# Patient Record
Sex: Female | Born: 1990 | Race: Black or African American | Hispanic: No | Marital: Single | Smoking: Never smoker
Health system: Southern US, Community
[De-identification: ages and names within clinical notes are randomized; demographics above are authoritative.]

## PROBLEM LIST (undated history)

## (undated) ENCOUNTER — Inpatient Hospital Stay (HOSPITAL_COMMUNITY): Payer: Self-pay

## (undated) DIAGNOSIS — F4310 Post-traumatic stress disorder, unspecified: Secondary | ICD-10-CM

## (undated) DIAGNOSIS — F429 Obsessive-compulsive disorder, unspecified: Secondary | ICD-10-CM

## (undated) DIAGNOSIS — B999 Unspecified infectious disease: Secondary | ICD-10-CM

## (undated) DIAGNOSIS — F329 Major depressive disorder, single episode, unspecified: Secondary | ICD-10-CM

## (undated) DIAGNOSIS — F32A Depression, unspecified: Secondary | ICD-10-CM

## (undated) HISTORY — PX: NO PAST SURGERIES: SHX2092

---
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
## 2012-09-28 ENCOUNTER — Encounter (HOSPITAL_COMMUNITY): Payer: Self-pay | Admitting: Emergency Medicine

## 2012-09-28 ENCOUNTER — Emergency Department (HOSPITAL_COMMUNITY)
Admission: EM | Admit: 2012-09-28 | Discharge: 2012-09-28 | Disposition: A | Discharge status: Home / Self Care | Payer: Medicaid Other | Attending: Emergency Medicine | Admitting: Emergency Medicine

## 2012-09-28 DIAGNOSIS — R51 Headache: Secondary | ICD-10-CM

## 2012-09-28 DIAGNOSIS — Z79899 Other long term (current) drug therapy: Secondary | ICD-10-CM | POA: Insufficient documentation

## 2012-09-28 DIAGNOSIS — N12 Tubulo-interstitial nephritis, not specified as acute or chronic: Secondary | ICD-10-CM | POA: Insufficient documentation

## 2012-09-28 DIAGNOSIS — Z3202 Encounter for pregnancy test, result negative: Secondary | ICD-10-CM | POA: Insufficient documentation

## 2012-09-28 DIAGNOSIS — R112 Nausea with vomiting, unspecified: Secondary | ICD-10-CM | POA: Insufficient documentation

## 2012-09-28 DIAGNOSIS — R109 Unspecified abdominal pain: Secondary | ICD-10-CM | POA: Insufficient documentation

## 2012-09-28 LAB — PREGNANCY, URINE: Preg Test, Ur: NEGATIVE

## 2012-09-28 LAB — URINE MICROSCOPIC-ADD ON

## 2012-09-28 LAB — POCT PREGNANCY, URINE: Preg Test, Ur: NEGATIVE

## 2012-09-28 LAB — CBC WITH DIFFERENTIAL/PLATELET
Eosinophils Absolute: 0.1 10*3/uL (ref 0.0–0.7)
Hemoglobin: 10.2 g/dL — ABNORMAL LOW (ref 12.0–15.0)
Lymphocytes Relative: 8 % — ABNORMAL LOW (ref 12–46)
Lymphs Abs: 1.3 10*3/uL (ref 0.7–4.0)
MCH: 25.4 pg — ABNORMAL LOW (ref 26.0–34.0)
Monocytes Relative: 5 % (ref 3–12)
Neutro Abs: 14 10*3/uL — ABNORMAL HIGH (ref 1.7–7.7)
Neutrophils Relative %: 87 % — ABNORMAL HIGH (ref 43–77)
RBC: 4.01 MIL/uL (ref 3.87–5.11)
WBC: 16.2 10*3/uL — ABNORMAL HIGH (ref 4.0–10.5)

## 2012-09-28 LAB — POCT I-STAT, CHEM 8
Chloride: 106 mEq/L (ref 96–112)
HCT: 37 % (ref 36.0–46.0)
Hemoglobin: 12.6 g/dL (ref 12.0–15.0)
Potassium: 4 mEq/L (ref 3.5–5.1)
Sodium: 143 mEq/L (ref 135–145)

## 2012-09-28 LAB — URINALYSIS, ROUTINE W REFLEX MICROSCOPIC
Glucose, UA: NEGATIVE mg/dL
Nitrite: POSITIVE — AB
Specific Gravity, Urine: 1.025 (ref 1.005–1.030)
pH: 6.5 (ref 5.0–8.0)

## 2012-09-28 MED ORDER — METOCLOPRAMIDE HCL 5 MG/ML IJ SOLN
10.0000 mg | Freq: Once | INTRAMUSCULAR | Status: AC
  Administered 2012-09-28: 10 mg via INTRAVENOUS
  Filled 2012-09-28: qty 2

## 2012-09-28 MED ORDER — DIPHENHYDRAMINE HCL 50 MG/ML IJ SOLN
25.0000 mg | Freq: Once | INTRAMUSCULAR | Status: AC
  Administered 2012-09-28: 25 mg via INTRAVENOUS
  Filled 2012-09-28: qty 1

## 2012-09-28 MED ORDER — KETOROLAC TROMETHAMINE 30 MG/ML IJ SOLN
30.0000 mg | Freq: Once | INTRAMUSCULAR | Status: AC
  Administered 2012-09-28: 30 mg via INTRAVENOUS
  Filled 2012-09-28: qty 1

## 2012-09-28 MED ORDER — DEXTROSE 5 % IV SOLN
1.0000 g | Freq: Once | INTRAVENOUS | Status: AC
  Administered 2012-09-28: 1 g via INTRAVENOUS
  Filled 2012-09-28: qty 10

## 2012-09-28 MED ORDER — SODIUM CHLORIDE 0.9 % IV BOLUS (SEPSIS)
1000.0000 mL | Freq: Once | INTRAVENOUS | Status: AC
  Administered 2012-09-28: 1000 mL via INTRAVENOUS

## 2012-09-28 MED ORDER — SULFAMETHOXAZOLE-TRIMETHOPRIM 800-160 MG PO TABS: 1.0000 | ORAL_TABLET | Freq: Two times a day (BID) | ORAL | Status: DC

## 2012-09-28 MED ORDER — PROMETHAZINE HCL 25 MG PO TABS: 25.0000 mg | ORAL_TABLET | Freq: Four times a day (QID) | ORAL | Status: DC | PRN

## 2012-09-28 NOTE — ED Provider Notes (Signed)
History     CSN: [id number]  Arrival date & time 09/28/12  1127   First MD Initiated Contact with Patient 09/28/12 1220      Chief Complaint  Patient presents with  . Abdominal Pain  . Headache    (Consider location/radiation/quality/duration/timing/severity/associated sxs/prior treatment) HPI Comments: Patient comes to the ER for evaluation of headache with nausea and vomiting. Patient reports that she started having nausea and vomiting several days ago. Patient reports that the vomiting has been intermittent. She cannot cough anything down. She has had similar symptoms in the past. Patient reports that she started to have a mild headache which has progressively worsened as well. She sometimes gets these headaches when she has episodes of vomiting similar to this, but the headache has been more persistent. She has not had any fever. No neck pain or stiffness. She does report a crampy and sharp abdominal pain that occurs when she vomits, but that resolved. In between episodes of emesis, she does not have abdominal pain. There has not been any blood in her vomit. She denies diarrhea.  Patient is a 22 y.o. female presenting with abdominal pain and headaches.  Abdominal Pain The primary symptoms of the illness include abdominal pain, nausea and vomiting. The primary symptoms of the illness do not include fever.  Headache  Associated symptoms include nausea and vomiting. Pertinent negatives include no fever.    History reviewed. No pertinent past medical history.  History reviewed. No pertinent past surgical history.  History reviewed. No pertinent family history.  History  Substance Use Topics  . Smoking status: Not on file  . Smokeless tobacco: Not on file  . Alcohol Use: Not on file    OB History    Grav Para Term Preterm Abortions TAB SAB Ect Mult Living                  Review of Systems  Constitutional: Negative for fever.  Gastrointestinal: Positive for nausea,  vomiting and abdominal pain.  Neurological: Positive for headaches.  All other systems reviewed and are negative.    Allergies  Review of patient's allergies indicates no known allergies.  Home Medications   Current Outpatient Rx  Name  Route  Sig  Dispense  Refill  . QUETIAPINE FUMARATE ER 200 MG PO TB24   Oral   Take 200 mg by mouth at bedtime.         . SERTRALINE HCL 50 MG PO TABS   Oral   Take 50 mg by mouth every morning.           BP 130/59  Temp 99 F (37.2 C) (Oral)  Resp 18  SpO2 100%  LMP 09/15/2012  Physical Exam  Constitutional: She is oriented to person, place, and time. She appears well-developed and well-nourished. No distress.  HENT:  Head: Normocephalic and atraumatic.  Right Ear: Hearing normal.  Nose: Nose normal.  Mouth/Throat: Oropharynx is clear and moist and mucous membranes are normal.  Eyes: Conjunctivae normal and EOM are normal. Pupils are equal, round, and reactive to light.  Neck: Normal range of motion. Neck supple.  Cardiovascular: Normal rate, regular rhythm, S1 normal and S2 normal.  Exam reveals no gallop and no friction rub.   No murmur heard. Pulmonary/Chest: Effort normal and breath sounds normal. No respiratory distress. She exhibits no tenderness.  Abdominal: Soft. Normal appearance and bowel sounds are normal. There is no hepatosplenomegaly. There is no tenderness. There is no rebound, no guarding, no tenderness  at McBurney's point and negative Murphy's sign. No hernia.  Musculoskeletal: Normal range of motion.  Neurological: She is alert and oriented to person, place, and time. She has normal strength. No cranial nerve deficit or sensory deficit. Coordination normal. GCS eye subscore is 4. GCS verbal subscore is 5. GCS motor subscore is 6.  Skin: Skin is warm, dry and intact. No rash noted. No cyanosis.  Psychiatric: She has a normal mood and affect. Her speech is normal and behavior is normal. Thought content normal.     ED Course  Procedures (including critical care time)  Labs Reviewed  CBC WITH DIFFERENTIAL - Abnormal; Notable for the following:    WBC 16.2 (*)     Hemoglobin 10.2 (*)     HCT 32.9 (*)     MCH 25.4 (*)     Neutrophils Relative 87 (*)     Neutro Abs 14.0 (*)     Lymphocytes Relative 8 (*)     All other components within normal limits  URINALYSIS, ROUTINE W REFLEX MICROSCOPIC - Abnormal; Notable for the following:    APPearance CLOUDY (*)     Nitrite POSITIVE (*)     Leukocytes, UA MODERATE (*)     All other components within normal limits  URINE MICROSCOPIC-ADD ON - Abnormal; Notable for the following:    Squamous Epithelial / LPF FEW (*)     Bacteria, UA MANY (*)     All other components within normal limits  PREGNANCY, URINE  POCT PREGNANCY, URINE  POCT I-STAT, CHEM 8  URINE CULTURE   No results found.   No diagnosis found.    MDM  Patient came to the ER with complaints of nausea and vomiting. She says that this is been going on for almost a week. Patient also has an associated headache. Headache was slow in onset, not thunderclap. She has a normal neurologic examination. Patient reports that she has some intermittent abdominal pain, but abdominal exam was completely benign. [name] headache was treated with Toradol, Reglan and Benadryl. Her headache is completely resolved. She was also hydrated. Repeat examination reveals that she is not expressing any abdominal pain. Repeat abdominal exam was soft, nontender. Lab work revealed leukocytosis with evidence of urinary tract infection. This is possibly consistent with acute pyelonephritis. Patient is no other vomiting and is a candidate for outpatient treatment. She was given a first dose of Rocephin here in the ER we'll discharge with antiemetics antibiotic coverage. Return if symptoms worsen.        Gilda Crease, MD 09/28/12 [phone]

## 2012-09-28 NOTE — ED Notes (Signed)
Pt reports vomiting and headache for the last week. Pt awake, alert, NAD.

## 2012-09-30 LAB — URINE CULTURE: Colony Count: >=100000

## 2012-10-01 NOTE — ED Notes (Signed)
+  Urine. Patient treated with Septra. Sensitive to same. Per protocol MD. °

## 2013-08-21 ENCOUNTER — Emergency Department (HOSPITAL_COMMUNITY)
Admission: EM | Admit: 2013-08-21 | Discharge: 2013-08-21 | Disposition: A | Discharge status: Home / Self Care | Payer: Medicaid Other | Attending: Emergency Medicine | Admitting: Emergency Medicine

## 2013-08-21 ENCOUNTER — Encounter (HOSPITAL_COMMUNITY): Payer: Self-pay | Admitting: Emergency Medicine

## 2013-08-21 DIAGNOSIS — L03019 Cellulitis of unspecified finger: Secondary | ICD-10-CM | POA: Insufficient documentation

## 2013-08-21 DIAGNOSIS — Z79899 Other long term (current) drug therapy: Secondary | ICD-10-CM | POA: Insufficient documentation

## 2013-08-21 DIAGNOSIS — L03011 Cellulitis of right finger: Secondary | ICD-10-CM

## 2013-08-21 MED ORDER — SULFAMETHOXAZOLE-TRIMETHOPRIM 800-160 MG PO TABS: 1.0000 | ORAL_TABLET | Freq: Two times a day (BID) | ORAL | Status: DC

## 2013-08-21 NOTE — ED Provider Notes (Signed)
CSN: [id number]     Arrival date & time 08/21/13  1634 History  This chart was scribed for non-physician practitioner Sharilyn Sites, PA-C working with Geoffery Lyons, MD by Valera Castle, ED scribe. This patient was seen in room TR06C/TR06C and the patient's care was started at 4:52 PM.  Chief Complaint  Patient presents with  . Hand Pain   The history is provided by the patient. No language interpreter was used.   HPI Comments: Shelby Duncan is a 22 y.o. female who presents to the Emergency Department complaining of gradual, moderate, constant right ring finger pain, with swelling and possible infection under nailbed, onset 2 weeks ago. She reports one episode of green drainage after trying to squeeze the area. She reports recently having her nails done 2 weeks ago, and a few days afterwards noticing the pain and swelling. She denies fever, numbness, tingling, and any other associated symptoms. She denies any allergies. She denies any medical history.   PCP - Default, Provider, MD  History reviewed. No pertinent past medical history. History reviewed. No pertinent past surgical history. History reviewed. No pertinent family history. History  Substance Use Topics  . Smoking status: Never Smoker   . Smokeless tobacco: Not on file  . Alcohol Use: No   OB History   Grav Para Term Preterm Abortions TAB SAB Ect Mult Living                 Review of Systems  Constitutional: Negative for fever.  Skin:       Positive for right ring finger pain around the nail, with swelling.  Neurological: Negative for numbness (and tingling).  All other systems reviewed and are negative.   Allergies  Review of patient's allergies indicates no known allergies.  Home Medications   Current Outpatient Rx  Name  Route  Sig  Dispense  Refill  . promethazine (PHENERGAN) 25 MG tablet   Oral   Take 1 tablet (25 mg total) by mouth every 6 (six) hours as needed for nausea.   30 tablet   0   . QUEtiapine  (SEROQUEL XR) 200 MG 24 hr tablet   Oral   Take 200 mg by mouth at bedtime.         . sertraline (ZOLOFT) 50 MG tablet   Oral   Take 50 mg by mouth every morning.         . sulfamethoxazole-trimethoprim (SEPTRA DS) 800-160 MG per tablet   Oral   Take 1 tablet by mouth every 12 (twelve) hours.   20 tablet   0    Triage Vitals: BP 123/64  Pulse 84  Temp(Src) 98.1 F (36.7 C) (Oral)  Resp 18  SpO2 100%  Physical Exam  Nursing note and vitals reviewed. Constitutional: She is oriented to person, place, and time. She appears well-developed and well-nourished. No distress.  HENT:  Head: Normocephalic and atraumatic.  Eyes: Conjunctivae and EOM are normal. Pupils are equal, round, and reactive to light.  Neck: Normal range of motion.  Cardiovascular: Normal rate, regular rhythm and normal heart sounds.   Pulmonary/Chest: Effort normal and breath sounds normal. No respiratory distress. She has no wheezes.  Musculoskeletal: Normal range of motion.  Neurological: She is alert and oriented to person, place, and time.  Skin: Skin is warm and dry. She is not diaphoretic.  Large perionychia of right ring finger. Full ROM of finger maintained; strong cap refill; sensation intact  Psychiatric: She has a normal mood and affect.  ED Course  Procedures (including critical care time)  INCISION AND DRAINAGE Performed by: Garlon Hatchet Consent: Verbal consent obtained. Risks and benefits: risks, benefits and alternatives were discussed Type: abscess  Body area: right ring finger   Anesthesia: local infiltration  Incision was made with an 18G needle.  Local anesthetic: none  Anesthetic total: none  Complexity: complex Blunt dissection to break up loculations  Drainage: purulent  Drainage amount: small/moderate  Packing material: none  Patient tolerance: Patient tolerated the procedure well with no immediate complications.   DIAGNOSTIC STUDIES: Oxygen Saturation  is 100% on room air, normal by my interpretation.    COORDINATION OF CARE: 4:55  PM-Discussed treatment plan which includes I&D with pt at bedside and pt agreed to plan.   Labs Review Labs Reviewed - No data to display Imaging Review No results found.  EKG Interpretation   None      No orders of the defined types were placed in this encounter.    MDM   1. Paronychia of finger, right    With a large paronychia of right ring finger.  I&D as above, patient tolerated well. She will be started on Bactrim. Instructed to keep finger clean and dry. Advised not to replace artificial nail until finger heals completely.  Discussed plan with pt, she agreed.  Return precautions advised.  I personally performed the services described in this documentation, which was scribed in my presence. The recorded information has been reviewed and is accurate.  Garlon Hatchet, PA-C 08/21/13 1722

## 2013-08-21 NOTE — ED Notes (Signed)
Pt with pain and swelling at base of 4th fingernail on right hand. States she had her nails done and the nail cracked so the tech put acrylic on top of the cracked nail. Pain and swelling for approx 2 weeks

## 2013-08-21 NOTE — ED Notes (Signed)
Pt with pain and swelling and possible infection under right 4th digit nail bed area x 2 weeks with some drainage

## 2013-08-22 NOTE — ED Provider Notes (Signed)
Medical screening examination/treatment/procedure(s) were performed by non-physician practitioner and as supervising physician I was immediately available for consultation/collaboration.     Geoffery Lyons, MD 08/22/13 0000

## 2013-09-24 DIAGNOSIS — F209 Schizophrenia, unspecified: Secondary | ICD-10-CM | POA: Insufficient documentation

## 2013-09-24 DIAGNOSIS — F329 Major depressive disorder, single episode, unspecified: Secondary | ICD-10-CM | POA: Insufficient documentation

## 2014-11-07 ENCOUNTER — Encounter (HOSPITAL_COMMUNITY): Payer: Self-pay

## 2014-11-07 ENCOUNTER — Emergency Department (HOSPITAL_COMMUNITY)
Admission: EM | Admit: 2014-11-07 | Discharge: 2014-11-07 | Disposition: A | Discharge status: Home / Self Care | Payer: Medicaid Other | Attending: Emergency Medicine | Admitting: Emergency Medicine

## 2014-11-07 DIAGNOSIS — R51 Headache: Secondary | ICD-10-CM | POA: Diagnosis not present

## 2014-11-07 DIAGNOSIS — Z3202 Encounter for pregnancy test, result negative: Secondary | ICD-10-CM | POA: Diagnosis not present

## 2014-11-07 DIAGNOSIS — N644 Mastodynia: Secondary | ICD-10-CM | POA: Diagnosis not present

## 2014-11-07 DIAGNOSIS — Z79899 Other long term (current) drug therapy: Secondary | ICD-10-CM | POA: Insufficient documentation

## 2014-11-07 LAB — POC URINE PREG, ED: Preg Test, Ur: NEGATIVE

## 2014-11-07 MED ORDER — IBUPROFEN 600 MG PO TABS: 600.0000 mg | ORAL_TABLET | Freq: Three times a day (TID) | ORAL | Status: DC | PRN

## 2014-11-07 NOTE — Discharge Instructions (Signed)
-   Follow-up with a PCP (Primary Care Provider) to set up an outpatient ultrasound of your breasts for further evaluation  Breast Tenderness Breast tenderness is a common problem for women of all ages. Breast tenderness may cause mild discomfort to severe pain. It has a variety of causes. Your health care provider will find out the likely cause of your breast tenderness by examining your breasts, asking you about symptoms, and ordering some tests. Breast tenderness usually does not mean you have breast cancer. HOME CARE INSTRUCTIONS  Breast tenderness often can be handled at home. You can try:  Getting fitted for a new bra that provides more support, especially during exercise.  Wearing a more supportive bra or sports bra while sleeping when your breasts are very tender.  If you have a breast injury, apply ice to the area:  Put ice in a plastic bag.  Place a towel between your skin and the bag.  Leave the ice on for 20 minutes, 2-3 times a day.  If your breasts are too full of milk as a result of breastfeeding, try:  Expressing milk either by hand or with a breast pump.  Applying a warm compress to the breasts for relief.  Taking over-the-counter pain relievers, if approved by your health care provider.  Taking other medicines that your health care provider prescribes. These may include antibiotic medicines or birth control pills. Over the long term, your breast tenderness might be eased if you:  Cut down on caffeine.  Reduce the amount of fat in your diet. Keep a log of the days and times when your breasts are most tender. This will help you and your health care provider find the cause of the tenderness and how to relieve it. Also, learn how to do breast exams at home. This will help you notice if you have an unusual growth or lump that could cause tenderness. SEEK MEDICAL CARE IF:   Any part of your breast is hard, red, and hot to the touch. This could be a sign of  infection.  Fluid is coming out of your nipples (and you are not breastfeeding). Especially watch for blood or pus.  You have a fever as well as breast tenderness.  You have a new or painful lump in your breast that remains after your menstrual period ends.  You have tried to take care of the pain at home, but it has not gone away.  Your breast pain is getting worse, or the pain is making it hard to do the things you usually do during your day. Document Released: 08/26/2008 Document Revised: 05/16/2013 Document Reviewed: 04/12/2013 [facility] Patient Information 2015 [place], [place]. This information is not intended to replace advice given to you by your health care provider. Make sure you discuss any questions you have with your health care provider.

## 2014-11-07 NOTE — ED Notes (Signed)
Pt presents with 2 day h/o pain to both sides of her chest.  Pt reports "lump" noted beside L breast that became tender, reports having same to R breast x 1 year but it became painful when L side began to hurt.  Areas are tender to palpation.  Pt also reports 2 day h/o frontal headache (h/o migraines); +nausea and vomiting; pt reports she gets migraines when those areas become painful.

## 2014-11-07 NOTE — ED Provider Notes (Signed)
CSN: [id number]     Arrival date & time 11/07/14  1318 History   First MD Initiated Contact with Patient 11/07/14 1609     No chief complaint on file. CC: Left breast pain   (Consider location/radiation/quality/duration/timing/severity/associated sxs/prior Treatment) Patient is a 24 y.o. female presenting with chest pain.  Chest Pain Chest pain location: Left upper lateral breast. Pain quality: aching and throbbing   Pain radiates to:  Does not radiate Pain radiates to the back: no   Pain severity:  Moderate Onset quality:  Gradual Duration:  2 days Timing:  Constant Progression:  Unchanged Chronicity:  New Context: no trauma   Relieved by:  None tried Worsened by:  Certain positions and movement Ineffective treatments:  None tried Associated symptoms: headache   Associated symptoms: no abdominal pain, no altered mental status, no cough, no diaphoresis, no fever, no nausea, no palpitations, no shortness of breath, not vomiting and no weakness   Risk factors: no coronary artery disease, no diabetes mellitus, no hypertension, not female and no prior DVT/PE     History reviewed. No pertinent past medical history. History reviewed. No pertinent past surgical history. History reviewed. No pertinent family history. History  Substance Use Topics  . Smoking status: Never Smoker   . Smokeless tobacco: Not on file  . Alcohol Use: No   OB History    No data available     Review of Systems  Constitutional: Negative for fever, diaphoresis and unexpected weight change.  HENT: Negative for sore throat.   Eyes: Negative for visual disturbance.  Respiratory: Negative for cough, shortness of breath and wheezing.   Cardiovascular: Positive for chest pain. Negative for palpitations.  Gastrointestinal: Negative for nausea, vomiting and abdominal pain.  Genitourinary: Negative for dysuria and flank pain.  Musculoskeletal: Negative for neck pain.  Skin: Negative for rash.  Neurological:  Positive for headaches. Negative for syncope and weakness.      Allergies  Review of patient's allergies indicates no known allergies.  Home Medications   Prior to Admission medications   Medication Sig Start Date End Date Taking? Authorizing Provider  promethazine (PHENERGAN) 25 MG tablet Take 1 tablet (25 mg total) by mouth every 6 (six) hours as needed for nausea. 09/28/12   Orpah Greek, MD  QUEtiapine (SEROQUEL XR) 200 MG 24 hr tablet Take 200 mg by mouth at bedtime.    Historical Provider, MD  sertraline (ZOLOFT) 50 MG tablet Take 50 mg by mouth every morning.    Historical Provider, MD  sulfamethoxazole-trimethoprim (SEPTRA DS) 800-160 MG per tablet Take 1 tablet by mouth every 12 (twelve) hours. 09/28/12   Orpah Greek, MD  sulfamethoxazole-trimethoprim (SEPTRA DS) 800-160 MG per tablet Take 1 tablet by mouth every 12 (twelve) hours. 08/21/13   Larene Pickett, PA-C   BP 128/63 mmHg  Pulse 70  Temp(Src) 97.7 F (36.5 C) (Oral)  Resp 18  Ht 5' 9"  (1.753 m)  Wt 184 lb 8 oz (83.689 kg)  BMI 27.23 kg/m2  SpO2 100%  LMP 11/07/2014 Physical Exam  Constitutional: She is oriented to person, place, and time. She appears well-developed and well-nourished. No distress.  HENT:  Head: Normocephalic.  Mouth/Throat: No oropharyngeal exudate.  Eyes: Pupils are equal, round, and reactive to light.  Neck: Neck supple.  Cardiovascular: Normal rate and normal heart sounds.  Exam reveals no friction rub.   No murmur heard. Pulmonary/Chest: Effort normal and breath sounds normal. No respiratory distress. She has no wheezes. She has  no rales. Right breast exhibits tenderness. Right breast exhibits no inverted nipple, no mass, no nipple discharge and no skin change. Left breast exhibits tenderness. Left breast exhibits no inverted nipple, no mass, no nipple discharge and no skin change.    Bilateral mild TTP in upper outer quadrants. Breast tissue heterogeneity along areas of  tenderness with no discrete masses. No overlying skin redness, induration, fluctuance, or abnormalities. No breast asymmetry. No LAD noted in axillae or supraclavicular areas bilaterally.  Abdominal: Soft. She exhibits no distension. There is no tenderness.  Musculoskeletal: She exhibits no tenderness.  Neurological: She is alert and oriented to person, place, and time. She displays normal reflexes. No cranial nerve deficit. She exhibits normal muscle tone.  Skin: Skin is warm. No rash noted.  Nursing note and vitals reviewed.   ED Course  Procedures (including critical care time) Labs Review Labs Reviewed  POC URINE PREG, ED    Imaging Review No results found.   EKG Interpretation   Date/Time:  Thursday November 07 2014 13:25:00 EST Ventricular Rate:  70 PR Interval:  124 QRS Duration: 84 QT Interval:  366 QTC Calculation: 395 R Axis:   85 Text Interpretation:  Normal sinus rhythm Normal ECG No old tracing to  compare Confirmed by Debby Freiberg [phone]) on 11/07/2014 4:23:41 PM      MDM   Final diagnoses:  Breast pain    Ms. Colbaugh is a 24 yo F with no significant PMHx who p/w bilateral breast pain, L>R, with associated "soft spots" on breasts bilaterally. See HPI above. On arrival, T 97.88F, HR 70, RR 18, BP 128/63, satting 100% on RA. Exam as above, pt overall well-appearing and in NAD, with normal WOB and clear BS bilaterally. Breast exam as above.  Pt's history and presentation is most c/w fibrocystic breast changes, with diffusely heterogenous breast tissue with mild TTP but no discrete masses. No h/o breast trauma to suggest fatty necrosis. No focal mass to suggest adenoma. No nipple changes or signs of ductal dilation. No erythema, fluctuance, or signs of mastoiditis or breast abscess. Pt does have family h/o breast CA, unknown BRCA status, but has no focal masses, no weight loss, no night sweats, and sx have been present x 1 year intermittently. No specific relation to  menstruation and UPT negative.   Given benign findings with stable vitals, do not suspect acute emergent etiology. Will d/c with close outpt f/u for breast ultrasound for further assessment. Discussed the importance of close f/u and imaging with pt who understands, including further w/u for cancer. VSS, will d/c home.  Clinical Impression: 1. Breast pain     Disposition: Discharge  Condition: Good  I have discussed the results, Dx and Tx plan with the pt(& family if present). He/she/they expressed understanding and agree(s) with the plan. Discharge instructions discussed at great length. Strict return precautions discussed and pt &/or family have verbalized understanding of the instructions. No further questions at time of discharge.    Discharge Medication List as of 11/07/2014  5:01 PM    START taking these medications   Details  ibuprofen (ADVIL,MOTRIN) 600 MG tablet Take 1 tablet (600 mg total) by mouth every 8 (eight) hours as needed for moderate pain., Starting 11/07/2014, Until Discontinued, Print        Follow Up: [place]     [address] [place] [id number] [phone] In 1 week Follow-up with a PCP for outpatient ultrasound of your breasts  Pt seen in conjunction with Dr. Henrene Hawking, MD 11/08/14 5974  Debby Freiberg, MD 11/11/14 [phone]

## 2014-11-07 NOTE — ED Notes (Signed)
Resident, Dr. Penne LashIssacs, at bedside.

## 2015-06-19 ENCOUNTER — Encounter (HOSPITAL_COMMUNITY): Payer: Self-pay | Admitting: *Deleted

## 2015-06-19 ENCOUNTER — Inpatient Hospital Stay (HOSPITAL_COMMUNITY)
Admission: AD | Admit: 2015-06-19 | Discharge: 2015-06-19 | Disposition: A | Discharge status: Home / Self Care | Payer: Medicaid Other | Source: Ambulatory Visit | Attending: Obstetrics and Gynecology | Admitting: Obstetrics and Gynecology

## 2015-06-19 DIAGNOSIS — N644 Mastodynia: Secondary | ICD-10-CM | POA: Insufficient documentation

## 2015-06-19 DIAGNOSIS — N926 Irregular menstruation, unspecified: Secondary | ICD-10-CM

## 2015-06-19 DIAGNOSIS — R110 Nausea: Secondary | ICD-10-CM | POA: Diagnosis present

## 2015-06-19 DIAGNOSIS — Z3A01 Less than 8 weeks gestation of pregnancy: Secondary | ICD-10-CM | POA: Diagnosis not present

## 2015-06-19 DIAGNOSIS — O26891 Other specified pregnancy related conditions, first trimester: Secondary | ICD-10-CM | POA: Insufficient documentation

## 2015-06-19 LAB — URINALYSIS, ROUTINE W REFLEX MICROSCOPIC
BILIRUBIN URINE: NEGATIVE
Glucose, UA: NEGATIVE mg/dL
HGB URINE DIPSTICK: NEGATIVE
KETONES UR: NEGATIVE mg/dL
Leukocytes, UA: NEGATIVE
NITRITE: NEGATIVE
Protein, ur: NEGATIVE mg/dL
Specific Gravity, Urine: 1.02 (ref 1.005–1.030)
UROBILINOGEN UA: 1 mg/dL (ref 0.0–1.0)
pH: 7.5 (ref 5.0–8.0)

## 2015-06-19 LAB — POCT PREGNANCY, URINE: PREG TEST UR: POSITIVE — AB

## 2015-06-19 NOTE — MAU Provider Note (Signed)
S:  Shelby Duncan is a 24 y.o. G1P0 at [redacted]w[redacted]d wks here for confirmation of pregnancy.  [name] [name] last menstrual period was 05/17/2015.Marland Kitchen  Denies any vaginal bleeding or abdominal pain.  Plans to get prenatal care at the [facility]; patient instructed to call the HD for care until she can bee seen in the [facility].  + nipple pain.    O:  GENERAL: Well-developed, well-nourished female in no acute distress.  LUNGS: Effort normal SKIN: Warm, dry and without erythema PSYCH: Normal mood and affect  No past medical history on file.  No family history on file.   Filed Vitals:   06/19/15 1815  BP: 130/63  Pulse: 101  Temp: 98.3 F (36.8 C)  Resp: 18   General:  A&OX3 with no signs of acute distress. She appears well-developed and well-nourished. No distress.  Neck: Normal range of motion.  Pulmonary/Chest: Effort normal. No respiratory distress.  Musculoskeletal: Normal range of motion.  Neurological: She is alert and oriented to person, place, and time.  Skin: Skin is warm and dry.   A: Positive Pregnancy Test Missed period   P: Explained benefits of taking prenatal vitamins w/folic acid early in pregnancy. Begin prenatal care. Reviewed warning signs of pregnancy.  Duane Lope, NP  06/19/2015 7:18 PM

## 2015-06-19 NOTE — MAU Note (Signed)
Pt stated  she has ben nauseated and having sore breasts. Pt reports she had a positive HPT.

## 2015-07-08 ENCOUNTER — Inpatient Hospital Stay (HOSPITAL_COMMUNITY)
Admission: AD | Admit: 2015-07-08 | Discharge: 2015-07-08 | Disposition: A | Discharge status: Home / Self Care | Payer: Medicaid Other | Source: Ambulatory Visit | Attending: Family Medicine | Admitting: Family Medicine

## 2015-07-08 ENCOUNTER — Encounter (HOSPITAL_COMMUNITY): Payer: Self-pay | Admitting: *Deleted

## 2015-07-08 ENCOUNTER — Inpatient Hospital Stay (HOSPITAL_COMMUNITY): Payer: Medicaid Other

## 2015-07-08 DIAGNOSIS — O209 Hemorrhage in early pregnancy, unspecified: Secondary | ICD-10-CM

## 2015-07-08 DIAGNOSIS — A499 Bacterial infection, unspecified: Secondary | ICD-10-CM

## 2015-07-08 DIAGNOSIS — N760 Acute vaginitis: Secondary | ICD-10-CM | POA: Diagnosis not present

## 2015-07-08 DIAGNOSIS — Z3A01 Less than 8 weeks gestation of pregnancy: Secondary | ICD-10-CM | POA: Insufficient documentation

## 2015-07-08 DIAGNOSIS — O4691 Antepartum hemorrhage, unspecified, first trimester: Secondary | ICD-10-CM

## 2015-07-08 DIAGNOSIS — N939 Abnormal uterine and vaginal bleeding, unspecified: Secondary | ICD-10-CM | POA: Diagnosis present

## 2015-07-08 DIAGNOSIS — O23591 Infection of other part of genital tract in pregnancy, first trimester: Secondary | ICD-10-CM | POA: Diagnosis not present

## 2015-07-08 DIAGNOSIS — B9689 Other specified bacterial agents as the cause of diseases classified elsewhere: Secondary | ICD-10-CM

## 2015-07-08 HISTORY — DX: Unspecified infectious disease: B99.9

## 2015-07-08 LAB — WET PREP, GENITAL
Trich, Wet Prep: NONE SEEN
Yeast Wet Prep HPF POC: NONE SEEN

## 2015-07-08 LAB — URINE MICROSCOPIC-ADD ON

## 2015-07-08 LAB — URINALYSIS, ROUTINE W REFLEX MICROSCOPIC
Bilirubin Urine: NEGATIVE
Glucose, UA: NEGATIVE mg/dL
Ketones, ur: NEGATIVE mg/dL
NITRITE: NEGATIVE
PH: 5.5 (ref 5.0–8.0)
Protein, ur: NEGATIVE mg/dL
UROBILINOGEN UA: 0.2 mg/dL (ref 0.0–1.0)

## 2015-07-08 LAB — CBC
HCT: 33.2 % — ABNORMAL LOW (ref 36.0–46.0)
Hemoglobin: 10.7 g/dL — ABNORMAL LOW (ref 12.0–15.0)
MCH: 26.9 pg (ref 26.0–34.0)
MCHC: 32.2 g/dL (ref 30.0–36.0)
MCV: 83.4 fL (ref 78.0–100.0)
Platelets: 326 K/uL (ref 150–400)
RBC: 3.98 MIL/uL (ref 3.87–5.11)
RDW: 14.5 % (ref 11.5–15.5)
WBC: 13.6 K/uL — ABNORMAL HIGH (ref 4.0–10.5)

## 2015-07-08 LAB — HCG, QUANTITATIVE, PREGNANCY: hCG, Beta Chain, Quant, S: 9138 m[IU]/mL — ABNORMAL HIGH (ref <5)

## 2015-07-08 LAB — ABO/RH: ABO/RH(D): B POS

## 2015-07-08 MED ORDER — METRONIDAZOLE 500 MG PO TABS: 500.0000 mg | ORAL_TABLET | Freq: Two times a day (BID) | ORAL | Status: DC

## 2015-07-08 NOTE — MAU Provider Note (Signed)
History     CSN: [id number]  Arrival date and time: 07/08/15 1441   First Provider Initiated Contact with Patient 07/08/15 1516      Chief Complaint  Patient presents with  . Vaginal Bleeding   HPI Pt is [redacted]w[redacted]d pregnant G2P1001 who presents for bleeding in pregnancy.  She started spotting this morning when she went to the bathroom and wipes.  The bleeding has gotten heavier, and is like a period- mixture of bright and dark red. Last IC 2 days ago.  Pt denies dysuria, vaginal pain, fever, chills, abdominal pain or cramping. Pt has previous full term vaginal delivery without any complications  Past Medical History  Diagnosis Date  . Infection     UTI    Past Surgical History  Procedure Laterality Date  . No past surgeries      Family History  Problem Relation Age of Onset  . Cancer Maternal Grandmother     breast  . Cancer Paternal Grandfather     Social History  Substance Use Topics  . Smoking status: Former Games developer  . Smokeless tobacco: Never Used     Comment: age 32  . Alcohol Use: No    Allergies: No Known Allergies  Prescriptions prior to admission  Medication Sig Dispense Refill Last Dose  . promethazine (PHENERGAN) 25 MG tablet Take 1 tablet (25 mg total) by mouth every 6 (six) hours as needed for nausea. 30 tablet 0   . sertraline (ZOLOFT) 50 MG tablet Take 50 mg by mouth every morning.   09/28/2012 at Unknown    Review of Systems  Constitutional: Negative for fever and chills.  Gastrointestinal: Negative for nausea, vomiting and abdominal pain.  Genitourinary: Negative for dysuria.   Physical Exam   Blood pressure 126/58, pulse 80, temperature 98.3 F (36.8 C), temperature source Oral, resp. rate 18, weight 206 lb 3.2 oz (93.532 kg), last menstrual period 05/17/2015.  Physical Exam  Nursing note and vitals reviewed. Constitutional: She is oriented to person, place, and time. She appears well-developed and well-nourished. No distress.  HENT:  Head:  Normocephalic.  Eyes: Pupils are equal, round, and reactive to light.  Neck: Normal range of motion. Neck supple.  Cardiovascular: Normal rate.   Respiratory: Effort normal.  GI: Soft. She exhibits no distension. There is no tenderness. There is no rebound and no guarding.  Genitourinary:  Small amount of pink tinged watery discharge with few small dark red clots; cervix clean, nontender, closed, uterus without appreciable enlargement- NT: adnexa without palpable enlargement or tenderness  Musculoskeletal: Normal range of motion.  Neurological: She is alert and oriented to person, place, and time.  Skin: Skin is warm and dry.  Psychiatric: She has a normal mood and affect.    MAU Course  Procedures Results for orders placed or performed during the hospital encounter of 07/08/15 (from the past 24 hour(s))  Urinalysis, Routine w reflex microscopic (not at [facility])     Status: Abnormal   Collection Time: 07/08/15  3:10 PM  Result Value Ref Range   Color, Urine YELLOW YELLOW   APPearance CLEAR CLEAR   Specific Gravity, Urine >1.030 (H) 1.005 - 1.030   pH 5.5 5.0 - 8.0   Glucose, UA NEGATIVE NEGATIVE mg/dL   Hgb urine dipstick LARGE (A) NEGATIVE   Bilirubin Urine NEGATIVE NEGATIVE   Ketones, ur NEGATIVE NEGATIVE mg/dL   Protein, ur NEGATIVE NEGATIVE mg/dL   Urobilinogen, UA 0.2 0.0 - 1.0 mg/dL   Nitrite NEGATIVE NEGATIVE  Leukocytes, UA TRACE (A) NEGATIVE  Urine microscopic-add on     Status: Abnormal   Collection Time: 07/08/15  3:10 PM  Result Value Ref Range   Squamous Epithelial / LPF FEW (A) RARE   WBC, UA 0-2 <3 WBC/hpf   RBC / HPF 3-6 <3 RBC/hpf   Bacteria, UA MANY (A) RARE   Urine-Other MUCOUS PRESENT   hCG, quantitative, pregnancy     Status: Abnormal   Collection Time: 07/08/15  3:28 PM  Result Value Ref Range   hCG, Beta Chain, Quant, S 9138 (H) <5 mIU/mL  CBC     Status: Abnormal   Collection Time: 07/08/15  3:28 PM  Result Value Ref Range   WBC 13.6 (H) 4.0 - 10.5  K/uL   RBC 3.98 3.87 - 5.11 MIL/uL   Hemoglobin 10.7 (L) 12.0 - 15.0 g/dL   HCT 54.0 (L) 98.1 - 19.1 %   MCV 83.4 78.0 - 100.0 fL   MCH 26.9 26.0 - 34.0 pg   MCHC 32.2 30.0 - 36.0 g/dL   RDW 47.8 29.5 - 62.1 %   Platelets 326 150 - 400 K/uL  ABO/Rh     Status: None (Preliminary result)   Collection Time: 07/08/15  3:28 PM  Result Value Ref Range   ABO/RH(D) B POS   Wet prep, genital     Status: Abnormal   Collection Time: 07/08/15  3:45 PM  Result Value Ref Range   Yeast Wet Prep HPF POC NONE SEEN NONE SEEN   Trich, Wet Prep NONE SEEN NONE SEEN   Clue Cells Wet Prep HPF POC MANY (A) NONE SEEN   WBC, Wet Prep HPF POC FEW (A) NONE SEEN   US Ob Comp Less 14 Wks  07/08/2015   CLINICAL DATA:  24 year old pregnant female with vaginal bleeding for 1 day. Estimated gestational age of [redacted] weeks 3 days by LMP.  EXAM: OBSTETRIC <14 WK [place] AND TRANSVAGINAL OB US  TECHNIQUE: Both transabdominal and transvaginal ultrasound examinations were performed for complete evaluation of the gestation as well as the maternal uterus, adnexal regions, and pelvic cul-de-sac. Transvaginal technique was performed to assess early pregnancy.  COMPARISON:  None.  FINDINGS: Intrauterine gestational sac: Visualized/normal in shape.  Yolk sac:  Visualized  Embryo:  Visualized  Cardiac Activity: Visualized  Heart Rate: 96  bpm  CRL:  7.0  mm   6 w   4 d                  [place] EDC: 02/27/2016  Maternal uterus/adnexae: A small subchorionic hemorrhage is noted.  The ovaries bilaterally are unremarkable.  There is no evidence of free fluid or adnexal mass.  IMPRESSION: Single living intrauterine gestation with estimated gestational age [redacted] weeks 4 days by this ultrasound.  Small subchronic hemorrhage.  Fetal heart rate < 100 bpm.   Electronically Signed   By: Harmon Pier M.D.   On: 07/08/2015 17:03   US Ob Transvaginal  07/08/2015   CLINICAL DATA:  24 year old pregnant female with vaginal bleeding for 1 day. Estimated gestational age of  [redacted] weeks 3 days by LMP.  EXAM: OBSTETRIC <14 WK [place] AND TRANSVAGINAL OB US  TECHNIQUE: Both transabdominal and transvaginal ultrasound examinations were performed for complete evaluation of the gestation as well as the maternal uterus, adnexal regions, and pelvic cul-de-sac. Transvaginal technique was performed to assess early pregnancy.  COMPARISON:  None.  FINDINGS: Intrauterine gestational sac: Visualized/normal in shape.  Yolk sac:  Visualized  Embryo:  Visualized  Cardiac Activity: Visualized  Heart Rate: 96  bpm  CRL:  7.0  mm   6 w   4 d                  [place] EDC: 02/27/2016  Maternal uterus/adnexae: A small subchorionic hemorrhage is noted.  The ovaries bilaterally are unremarkable.  There is no evidence of free fluid or adnexal mass.  IMPRESSION: Single living intrauterine gestation with estimated gestational age [redacted] weeks 4 days by this ultrasound.  Small subchronic hemorrhage.  Fetal heart rate < 100 bpm.   Electronically Signed   By: Harmon Pier M.D.   On: 07/08/2015 17:03  GC/Chlamydia pending  Assessment and Plan  Bleeding in pregnancy-  SLIUP- slow heart rate BV- flagyl  BID for 7 days F/u with OB care  Darwyn Ponzo 07/08/2015, 3:27 PM

## 2015-07-08 NOTE — Discharge Instructions (Signed)
Safe Medications in Pregnancy   Acne: Benzoyl Peroxide Salicylic Acid  Backache/Headache: Tylenol: 2 regular strength every 4 hours OR              2 Extra strength every 6 hours  Colds/Coughs/Allergies: Benadryl (alcohol free) 25 mg every 6 hours as needed Breath right strips Claritin Cepacol throat lozenges Chloraseptic throat spray Cold-Eeze- up to three times per day Cough drops, alcohol free Flonase (by prescription only) Guaifenesin Mucinex Robitussin DM (plain only, alcohol free) Saline nasal spray/drops Sudafed (pseudoephedrine) & Actifed ** use only after [redacted] weeks gestation and if you do not have high blood pressure Tylenol Vicks Vaporub Zinc lozenges Zyrtec   Constipation: Colace Ducolax suppositories Fleet enema Glycerin suppositories Metamucil Milk of magnesia Miralax Senokot Smooth move tea  Diarrhea: Kaopectate Imodium A-D  *NO pepto Bismol  Hemorrhoids: Anusol Anusol HC Preparation H Tucks  Indigestion: Tums Maalox Mylanta Zantac  Pepcid  Insomnia: Benadryl (alcohol free) 25mg  every 6 hours as needed Tylenol PM Unisom, no Gelcaps  Leg Cramps: Tums MagGel  Nausea/Vomiting:  Bonine Dramamine Emetrol Ginger extract Sea bands Meclizine  Nausea medication to take during pregnancy:  Unisom (doxylamine succinate 25 mg tablets) Take one tablet daily at bedtime. If symptoms are not adequately controlled, the dose can be increased to a maximum recommended dose of two tablets daily (1/2 tablet in the morning, 1/2 tablet mid-afternoon and one at bedtime). Vitamin B6 100mg  tablets. Take one tablet twice a day (up to 200 mg per day).  Skin Rashes: Aveeno products Benadryl cream or 25mg  every 6 hours as needed Calamine Lotion 1% cortisone cream  Yeast infection: Gyne-lotrimin 7 Monistat 7   **If taking multiple medications, please check labels to avoid duplicating the same active ingredients **take medication as directed on  the label ** Do not exceed 4000 mg of tylenol in 24 hours **Do not take medications that contain aspirin or ibuprofen     ________________________________________     To schedule your Maternity Eligibility Appointment, please call [phone].  When you arrive for your appointment you must bring the following items or information listed below.  Your appointment will be rescheduled if you do not have these items or are 15 minutes late. If currently receiving Medicaid, you MUST bring: 1. Medicaid Card 2. Social Security Card 3. Picture ID 4. Proof of Pregnancy 5. Verification of current address if the address on Medicaid card is incorrect "postmarked mail" If not receiving Medicaid, you MUST bring: 1. Social Security Card 2. Picture ID 3. Birth Certificate (if available) Passport or *Green Card 4. Proof of Pregnancy 5. Verification of current address "postmarked mail" for each income presented. 6. Verification of insurance coverage, if any 7. Check stubs from each employer for the previous month (if unable to present check stub  for each week, we will accept check stub for the first and last week ill the same month.) If you can't locate check stubs, you must bring a letter from the employer(s) and it must have the following information on letterhead, typed, in English: o name of company o company telephone number o how long been with the company, if less than one month o how much person earns per hour o how many hours per week work o the gross pay the person earned for the previous month If you are 23 years old or less, you do not have to bring proof of income unless you work or live with the father of the baby and at that time  we will need proof of income from you and/or the father of the baby. Green Card recipients are eligible for Medicaid for Pregnant Women (MPW)  Safe Medications in Pregnancy   Acne: Benzoyl Peroxide Salicylic Acid  Backache/Headache: Tylenol: 2 regular  strength every 4 hours OR              2 Extra strength every 6 hours  Colds/Coughs/Allergies: Benadryl (alcohol free) 25 mg every 6 hours as needed Breath right strips Claritin Cepacol throat lozenges Chloraseptic throat spray Cold-Eeze- up to three times per day Cough drops, alcohol free Flonase (by prescription only) Guaifenesin Mucinex Robitussin DM (plain only, alcohol free) Saline nasal spray/drops Sudafed (pseudoephedrine) & Actifed ** use only after [redacted] weeks gestation and if you do not have high blood pressure Tylenol Vicks Vaporub Zinc lozenges Zyrtec   Constipation: Colace Ducolax suppositories Fleet enema Glycerin suppositories Metamucil Milk of magnesia Miralax Senokot Smooth move tea  Diarrhea: Kaopectate Imodium A-D  *NO pepto Bismol  Hemorrhoids: Anusol Anusol HC Preparation H Tucks  Indigestion: Tums Maalox Mylanta Zantac  Pepcid  Insomnia: Benadryl (alcohol free)  every 6 hours as needed Tylenol PM Unisom, no Gelcaps  Leg Cramps: Tums MagGel  Nausea/Vomiting:  Bonine Dramamine Emetrol Ginger extract Sea bands Meclizine  Nausea medication to take during pregnancy:  Unisom (doxylamine succinate 25 mg tablets) Take one tablet daily at bedtime. If symptoms are not adequately controlled, the dose can be increased to a maximum recommended dose of two tablets daily (1/2 tablet in the morning, 1/2 tablet mid-afternoon and one at bedtime). Vitamin B6  tablets. Take one tablet twice a day (up to 200 mg per day).  Skin Rashes: Aveeno products Benadryl cream or  every 6 hours as needed Calamine Lotion 1% cortisone cream  Yeast infection: Gyne-lotrimin 7 Monistat 7   **If taking multiple medications, please check labels to avoid duplicating the same active ingredients **take medication as directed on the label ** Do not exceed 4000 mg of tylenol in 24 hours **Do not take medications that contain aspirin or  ibuprofen     ________________________________________     To schedule your Maternity Eligibility Appointment, please call [phone].  When you arrive for your appointment you must bring the following items or information listed below.  Your appointment will be rescheduled if you do not have these items or are 15 minutes late. If currently receiving Medicaid, you MUST bring: 6. Medicaid Card 7. Social Security Card 8. Picture ID 9. Proof of Pregnancy 10. Verification of current address if the address on Medicaid card is incorrect "postmarked mail" If not receiving Medicaid, you MUST bring: 8. Social Security Card 9. Picture ID 10. Birth Certificate (if available) Passport or *Green Card 11. Proof of Pregnancy 12. Verification of current address "postmarked mail" for each income presented. 13. Verification of insurance coverage, if any 14. Check stubs from each employer for the previous month (if unable to present check stub  for each week, we will accept check stub for the first and last week ill the same month.) If you can't locate check stubs, you must bring a letter from the employer(s) and it must have the following information on letterhead, typed, in English: o name of company o company telephone number o how long been with the company, if less than one month o how much person earns per hour o how many hours per week work o the gross pay the person earned for the previous month If you are 23 years  old or less, you do not have to bring proof of income unless you work or live with the father of the baby and at that time we will need proof of income from you and/or the father of the baby. Green Card recipients are eligible for Medicaid for Pregnant Women (MPW)

## 2015-07-08 NOTE — MAU Note (Signed)
This morning saw a little of blood.  This afternoon, when went to the bathroom, noted blood in her underwear, looks like her period.  No pain

## 2015-07-09 LAB — HIV ANTIBODY (ROUTINE TESTING W REFLEX): HIV Screen 4th Generation wRfx: NONREACTIVE

## 2015-07-09 LAB — GC/CHLAMYDIA PROBE AMP (~~LOC~~) NOT AT ARMC
CHLAMYDIA, DNA PROBE: NEGATIVE
NEISSERIA GONORRHEA: NEGATIVE

## 2015-08-19 ENCOUNTER — Encounter: Payer: Self-pay | Admitting: Certified Nurse Midwife

## 2015-08-19 ENCOUNTER — Ambulatory Visit (INDEPENDENT_AMBULATORY_CARE_PROVIDER_SITE_OTHER): Payer: Medicaid Other | Admitting: Certified Nurse Midwife

## 2015-08-19 ENCOUNTER — Encounter: Payer: Medicaid Other | Admitting: Certified Nurse Midwife

## 2015-08-19 VITALS — BP 106/68 | HR 79 | Temp 98.0°F | Ht 69.0 in | Wt 220.0 lb

## 2015-08-19 DIAGNOSIS — Z1389 Encounter for screening for other disorder: Secondary | ICD-10-CM | POA: Diagnosis not present

## 2015-08-19 DIAGNOSIS — N939 Abnormal uterine and vaginal bleeding, unspecified: Secondary | ICD-10-CM

## 2015-08-19 DIAGNOSIS — Z3202 Encounter for pregnancy test, result negative: Secondary | ICD-10-CM | POA: Diagnosis not present

## 2015-08-19 DIAGNOSIS — O200 Threatened abortion: Secondary | ICD-10-CM

## 2015-08-19 LAB — POCT URINALYSIS DIPSTICK
Bilirubin, UA: NEGATIVE
Blood, UA: NEGATIVE
Glucose, UA: NEGATIVE
KETONES UA: NEGATIVE
LEUKOCYTES UA: NEGATIVE
Nitrite, UA: NEGATIVE
PH UA: 6
PROTEIN UA: NEGATIVE
SPEC GRAV UA: 1.015
UROBILINOGEN UA: NEGATIVE

## 2015-08-19 LAB — CBC WITH DIFFERENTIAL/PLATELET
Basophils Absolute: 0 10*3/uL (ref 0.0–0.1)
Basophils Relative: 0 % (ref 0–1)
EOS ABS: 0.2 10*3/uL (ref 0.0–0.7)
EOS PCT: 2 % (ref 0–5)
HCT: 35.9 % — ABNORMAL LOW (ref 36.0–46.0)
Hemoglobin: 11.4 g/dL — ABNORMAL LOW (ref 12.0–15.0)
LYMPHS ABS: 2.5 10*3/uL (ref 0.7–4.0)
Lymphocytes Relative: 21 % (ref 12–46)
MCH: 26.1 pg (ref 26.0–34.0)
MCHC: 31.8 g/dL (ref 30.0–36.0)
MCV: 82.2 fL (ref 78.0–100.0)
MONOS PCT: 6 % (ref 3–12)
MPV: 11.5 fL (ref 8.6–12.4)
Monocytes Absolute: 0.7 10*3/uL (ref 0.1–1.0)
Neutro Abs: 8.3 10*3/uL — ABNORMAL HIGH (ref 1.7–7.7)
Neutrophils Relative %: 71 % (ref 43–77)
PLATELETS: 403 10*3/uL — AB (ref 150–400)
RBC: 4.37 MIL/uL (ref 3.87–5.11)
RDW: 13.7 % (ref 11.5–15.5)
WBC: 11.7 10*3/uL — ABNORMAL HIGH (ref 4.0–10.5)

## 2015-08-19 LAB — POCT URINE PREGNANCY: Preg Test, Ur: NEGATIVE

## 2015-08-19 MED ORDER — CITRANATAL HARMONY 30-1-260 MG PO CAPS: 1.0000 | ORAL_CAPSULE | Freq: Every day | ORAL | Status: DC

## 2015-08-19 NOTE — Progress Notes (Signed)
Patient ID: Shelby Duncan, female   DOB: May 27, 1991, 24 y.o.   MRN: [id number]   Chief Complaint  Patient presents with  . Problem    Miscarriage    HPI Shelby Duncan is a 24 y.o. female.  Has been having bleeding like a period monthly since September.  October had bleeding for 5 days.  November had bleeding for four days.  States light bleeding, no heavy bleeding, denies any cramping or clots.         HPI  Past Medical History  Diagnosis Date  . Infection     UTI    Past Surgical History  Procedure Laterality Date  . No past surgeries      Family History  Problem Relation Age of Onset  . Cancer Maternal Grandmother     breast  . Cancer Paternal Grandfather     Social History Social History  Substance Use Topics  . Smoking status: Former Smoker    Types: Cigarettes    Quit date: 08/18/2006  . Smokeless tobacco: Never Used     Comment: age 52  . Alcohol Use: No    No Known Allergies  Current Outpatient Prescriptions  Medication Sig Dispense Refill  . Prenat w/o A-FeCbn-DSS-FA-DHA (CITRANATAL HARMONY) 30-1-260 MG CAPS Take 1 tablet by mouth daily. 30 capsule 12   No current facility-administered medications for this visit.    Review of Systems Review of Systems Constitutional: negative for fatigue and weight loss Respiratory: negative for cough and wheezing Cardiovascular: negative for chest pain, fatigue and palpitations Gastrointestinal: negative for abdominal pain and change in bowel habits Genitourinary: + vaginal bleeding Integument/breast: negative for nipple discharge Musculoskeletal:negative for myalgias Neurological: negative for gait problems and tremors Behavioral/Psych: negative for abusive relationship, depression Endocrine: negative for temperature intolerance     Blood pressure 106/68, pulse 79, temperature 98 F (36.7 C), height  (1.753 m), weight 220 lb (99.791 kg), last menstrual period 05/17/2015, unknown if currently  breastfeeding.  Physical Exam Physical Exam General:   alert  Skin:   no rash or abnormalities  Lungs:   clear to auscultation bilaterally  Heart:   regular rate and rhythm, S1, S2 normal, no murmur, click, rub or gallop  Breasts:   deferred  Abdomen:  normal findings: no organomegaly, soft, non-tender and no hernia  Pelvis:  deferred    50% of 30 min visit spent on counseling and coordination of care.   Data Reviewed Previous medical hx, labs, meds, [place]  Assessment     Threatened abortion d/t hx of bleeding in early pregnancy ? Pregnancy, negative UPT     Plan    Orders Placed This Encounter  Procedures  . US Pelvis Complete    Standing Status: Future     Number of Occurrences:      Standing Expiration Date: 10/18/2016    Order Specific Question:  Reason for Exam (SYMPTOM  OR DIAGNOSIS REQUIRED)    Answer:  miscarriage    Order Specific Question:  Preferred imaging location?    Answer:  [facility]  . US Transvaginal Non-OB    Standing Status: Future     Number of Occurrences:      Standing Expiration Date: 10/18/2016    Order Specific Question:  Reason for Exam (SYMPTOM  OR DIAGNOSIS REQUIRED)    Answer:  miscarriage    Order Specific Question:  Preferred imaging location?    Answer:  [facility]  . B-HCG Quant  . CBC with Differential/Platelet  .  POCT urinalysis dipstick  . POCT urine pregnancy   Meds ordered this encounter  Medications  . Prenat w/o A-FeCbn-DSS-FA-DHA (CITRANATAL HARMONY) 30-1-260 MG CAPS    Sig: Take 1 tablet by mouth daily.    Dispense:  30 capsule    Refill:  12    Need to obtain previous records Possible management options include: treatment for missed ab Follow up after US, labs.

## 2015-08-20 ENCOUNTER — Telehealth: Payer: Self-pay | Admitting: *Deleted

## 2015-08-20 LAB — HCG, QUANTITATIVE, PREGNANCY

## 2015-08-20 NOTE — Telephone Encounter (Signed)
Patient contacted the office requesting lab results.  Attempted to contact the patient and unable to leave message due to mailbox being full.

## 2015-08-26 ENCOUNTER — Encounter: Payer: Self-pay | Admitting: Certified Nurse Midwife

## 2015-08-26 ENCOUNTER — Ambulatory Visit (INDEPENDENT_AMBULATORY_CARE_PROVIDER_SITE_OTHER): Payer: Medicaid Other | Admitting: Certified Nurse Midwife

## 2015-08-26 ENCOUNTER — Ambulatory Visit (HOSPITAL_COMMUNITY)
Admission: RE | Admit: 2015-08-26 | Discharge: 2015-08-26 | Disposition: A | Discharge status: Home / Self Care | Payer: Medicaid Other | Source: Ambulatory Visit | Attending: Certified Nurse Midwife | Admitting: Certified Nurse Midwife

## 2015-08-26 VITALS — BP 107/72 | HR 82 | Temp 98.0°F | Ht 69.0 in | Wt 225.0 lb

## 2015-08-26 DIAGNOSIS — Z3202 Encounter for pregnancy test, result negative: Secondary | ICD-10-CM | POA: Insufficient documentation

## 2015-08-26 DIAGNOSIS — O021 Missed abortion: Secondary | ICD-10-CM | POA: Diagnosis not present

## 2015-08-26 DIAGNOSIS — N8312 Corpus luteum cyst of left ovary: Secondary | ICD-10-CM | POA: Insufficient documentation

## 2015-08-26 DIAGNOSIS — N939 Abnormal uterine and vaginal bleeding, unspecified: Secondary | ICD-10-CM | POA: Diagnosis present

## 2015-08-26 DIAGNOSIS — O200 Threatened abortion: Secondary | ICD-10-CM

## 2015-08-26 NOTE — Progress Notes (Signed)
Patient ID: Shelby Duncan, female   DOB: [birth year], 23 y.o.   MRN: [id number]   Chief Complaint  Patient presents with  . Follow-up    HPI Shelby Duncan is a 24 y.o. female.  Reviewed US and blood work results with patient.  Answered patients questions.  Discussed f/u with Dr. Clearance CootsHarper for missed ab.  Patient states that she has not had any bleeding since the US October 11th.  At initial ob visit had a negative pregnancy test.  F/U with quant that was also negative.  US report states no products of conception visible but endometrial stripe is thickened.  Discussed results with Dr. Clearance CootsHarper.       HPI  Past Medical History  Diagnosis Date  . Infection     UTI    Past Surgical History  Procedure Laterality Date  . No past surgeries      Family History  Problem Relation Age of Onset  . Cancer Maternal Grandmother     breast  . Cancer Paternal Grandfather     Social History Social History  Substance Use Topics  . Smoking status: Former Smoker    Types: Cigarettes    Quit date: 08/18/2006  . Smokeless tobacco: Never Used     Comment: age 24  . Alcohol Use: No    No Known Allergies  Current Outpatient Prescriptions  Medication Sig Dispense Refill  . Prenat w/o A-FeCbn-DSS-FA-DHA (CITRANATAL HARMONY) 30-1-260 MG CAPS Take 1 tablet by mouth daily. (Patient not taking: Reported on 08/26/2015) 30 capsule 12   No current facility-administered medications for this visit.    Review of Systems Review of Systems Constitutional: negative for fatigue and weight loss Respiratory: negative for cough and wheezing Cardiovascular: negative for chest pain, fatigue and palpitations Gastrointestinal: negative for abdominal pain and change in bowel habits Genitourinary:negative Integument/breast: negative for nipple discharge Musculoskeletal:negative for myalgias Neurological: negative for gait problems and tremors Behavioral/Psych: negative for abusive relationship,  depression Endocrine: negative for temperature intolerance     Blood pressure 107/72, pulse 82, temperature 98 F (36.7 C), height 5\' 9"  (1.753 m), weight 225 lb (102.059 kg), unknown if currently breastfeeding.  Physical Exam Physical Exam General:   alert  Skin:   no rash or abnormalities  Lungs:   clear to auscultation bilaterally  Heart:   regular rate and rhythm, S1, S2 normal, no murmur, click, rub or gallop  Breasts:   normal without suspicious masses, skin or nipple changes or axillary nodes  Abdomen:  normal findings: no organomegaly, soft, non-tender and no hernia  Pelvis:  External genitalia: normal general appearance Urinary system: urethral meatus normal and bladder without fullness, nontender Vaginal: normal without tenderness, induration or masses Cervix: normal appearance Adnexa: normal bimanual exam Uterus: anteverted and non-tender, normal size    100% of 15 min visit spent on counseling and coordination of care.   Data Reviewed Previous medical hx, labs, meds, ultrasound  Assessment     Missed AB     Plan   F/U with Dr. Clearance CootsHarper in 2 weeks for possible D&E if no period like bleeding in the next few weeks.   No orders of the defined types were placed in this encounter.   No orders of the defined types were placed in this encounter.

## 2015-08-28 NOTE — Progress Notes (Signed)
Entered in error

## 2015-08-28 NOTE — Telephone Encounter (Signed)
Patient in office 08-26-15

## 2015-08-29 ENCOUNTER — Other Ambulatory Visit: Payer: Self-pay | Admitting: Certified Nurse Midwife

## 2015-09-02 ENCOUNTER — Encounter (HOSPITAL_COMMUNITY): Payer: Self-pay | Admitting: Emergency Medicine

## 2015-09-02 ENCOUNTER — Emergency Department (HOSPITAL_COMMUNITY)
Admission: EM | Admit: 2015-09-02 | Discharge: 2015-09-02 | Disposition: A | Discharge status: Home / Self Care | Payer: Medicaid Other | Attending: Emergency Medicine | Admitting: Emergency Medicine

## 2015-09-02 DIAGNOSIS — F329 Major depressive disorder, single episode, unspecified: Secondary | ICD-10-CM | POA: Insufficient documentation

## 2015-09-02 DIAGNOSIS — Z87891 Personal history of nicotine dependence: Secondary | ICD-10-CM | POA: Diagnosis not present

## 2015-09-02 DIAGNOSIS — F419 Anxiety disorder, unspecified: Secondary | ICD-10-CM | POA: Insufficient documentation

## 2015-09-02 DIAGNOSIS — Z3202 Encounter for pregnancy test, result negative: Secondary | ICD-10-CM | POA: Diagnosis not present

## 2015-09-02 DIAGNOSIS — Z87440 Personal history of urinary (tract) infections: Secondary | ICD-10-CM | POA: Insufficient documentation

## 2015-09-02 DIAGNOSIS — F410 Panic disorder [episodic paroxysmal anxiety] without agoraphobia: Secondary | ICD-10-CM | POA: Diagnosis present

## 2015-09-02 LAB — POC URINE PREG, ED: Preg Test, Ur: NEGATIVE

## 2015-09-02 MED ORDER — LORAZEPAM 1 MG PO TABS: 1.0000 mg | ORAL_TABLET | Freq: Three times a day (TID) | ORAL | Status: DC | PRN

## 2015-09-02 MED ORDER — LORAZEPAM 0.5 MG PO TABS
1.0000 mg | ORAL_TABLET | Freq: Once | ORAL | Status: AC
Start: 2015-09-02 — End: 2015-09-02
  Administered 2015-09-02: 1 mg via ORAL
  Filled 2015-09-02: qty 2

## 2015-09-02 NOTE — Discharge Instructions (Signed)
Panic Attacks °Panic attacks are sudden, short feelings of great fear or discomfort. You may have them for no reason when you are relaxed, when you are uneasy (anxious), or when you are sleeping.  °HOME CARE °· Take all your medicines as told. °· Check with your doctor before starting new medicines. °· Keep all doctor visits. °GET HELP IF: °· You are not able to take your medicines as told. °· Your symptoms do not get better. °· Your symptoms get worse. °GET HELP RIGHT AWAY IF: °· Your attacks seem different than your normal attacks. °· You have thoughts about hurting yourself or others. °· You take panic attack medicine and you have a side effect. °MAKE SURE YOU: °· Understand these instructions. °· Will watch your condition. °· Will get help right away if you are not doing well or get worse. °  °This information is not intended to replace advice given to you by your health care provider. Make sure you discuss any questions you have with your health care provider. °  °Document Released: 10/16/2010 Document Revised: 07/04/2013 Document Reviewed: 04/27/2013 °Elsevier Interactive Patient Education ©2016 Elsevier Inc. ° ° °Emergency Department Resource Guide °1) Find a Doctor and Pay Out of Pocket °Although you won't have to find out who is covered by your insurance plan, it is a good idea to ask around and get recommendations. You will then need to call the office and see if the doctor you have chosen will accept you as a new patient and what types of options they offer for patients who are self-pay. Some doctors offer discounts or will set up payment plans for their patients who do not have insurance, but you will need to ask so you aren't surprised when you get to your appointment. ° °2) Contact Your Local Health Department °Not all health departments have doctors that can see patients for sick visits, but many do, so it is worth a call to see if yours does. If you don't know where your local health department is,  you can check in your phone book. The CDC also has a tool to help you locate your state's health department, and many state websites also have listings of all of their local health departments. ° °3) Find a Walk-in Clinic °If your illness is not likely to be very severe or complicated, you may want to try a walk in clinic. These are popping up all over the country in pharmacies, drugstores, and shopping centers. They're usually staffed by nurse practitioners or physician assistants that have been trained to treat common illnesses and complaints. They're usually fairly quick and inexpensive. However, if you have serious medical issues or chronic medical problems, these are probably not your best option. ° °No Primary Care Doctor: °- Call [facility] at  832-8000 - they can help you locate a primary care doctor that  accepts your insurance, provides certain services, etc. °- Physician Referral Service- 1-800-533-3463 ° °Chronic Pain Problems: °Organization         Address  Phone   Notes  °[facility]  (336) 297-2271 Patients need to be referred by their primary care doctor.  ° °Medication Assistance: °Organization         Address  Phone   Notes  °[place] [facility] [address] °[place], [place] [address] (336) 641-8030 --Must be a resident of [place] °-- Must have NO insurance coverage whatsoever (no Medicaid/ Medicare, etc.) °-- The pt. MUST   have a primary care doctor that directs their care regularly and follows them in the community °  °[facility]  (866) 331-1348   °[employer]  (888) 892-1162   ° °Agencies that provide inexpensive medical care: °Organization         Address  Phone   Notes  °[facility]  (336) 832-8035   °[facility]    (336) 832-7272   °[facility] [address] °[place], [place] [address] (336) 832-4777   °[facility] [address], °[place] (336)  271-4999   °[facility]    (336) 373-0678   °[facility]    (336) 272-1050   °[facility] ° [address], [place] Phone:  (336) 832-4444, Fax:  (336) 832-4440 Hours of Operation:  9 am - 6 pm, M-F.  Also accepts Medicaid/Medicare and self-pay.  °[facility] ° [address], [place] Phone: (336) 832-3150, Fax: (336) 832-3151. Hours of Operation:  8:30 am - 5:30 pm, M-F.  Also accepts Medicaid and self-pay.  °[facility] [place] [address], [place] Phone: (336) 878-6027   °[facility] [address], [place], [place] (336)723-1848, Ext. 123 Mondays & Thursdays: 7-9 AM.  First 15 patients are seen on a first come, first serve basis. °  ° °Medicaid-accepting [place] Providers: ° °Organization         Address  Phone   Notes  °[facility] [address], [place] (336) 641-2100 Also accepts self-pay patients.  °[facility] [address], [place] ° (336) 856-9996   °[facility] [address], [place] (336) 288-8857   °[facility] [address] [place] [address], [place] (336) 299-7000   °Veita Bland [address], [place]  ° (336) 373-1557 Only accepts [place] Access Medicaid patients after they have their name applied to their card.  ° °Self-Pay (no insurance) in [place]: ° °Organization         Address  Phone   Notes  °Sickle Cell Patients, [facility] [address], [place] (336) 832-1970   °[facility] [address], [place] (336) 832-4400   °[facility] ° [address], [place] (336) 992-4800   °[facility]/Dr. Osei-Bonsu ° [address] [place] [address], [place] or [address], [place] (336) 841-8500 Phone number for both [place] and [place] locations  is the same.  °[facility] [address], [place] (336) 299-0000   °[facility] [address] [place] [address], [place] or [address] (336) 852-7530 °(336) 878-2260   °[facility] [address],  (336) 350-1642, phone; (336) 294-5005, fax Sees patients 1st and 3rd Saturday of every month.  Must not qualify for public or private insurance (i.e. Medicaid, Medicare, [place] Health Choice, Veterans' Benefits) • Household income should be no more than 200% of the poverty level •The clinic cannot treat you if you are pregnant or think you are pregnant • Sexually transmitted diseases are not treated at the clinic.  ° ° °Dental Care: °Organization         Address  Phone  Notes  °[facility] [address],   [place] (336) 641-6152 Accepts children up to age 21 who are enrolled in Medicaid or [place] Health Choice; pregnant women with a Medicaid card; and children who have applied for Medicaid or [place] Health Choice, but were declined, whose parents can pay a reduced fee at time of service.  °[facility] [place]  [address], [place] (336) 641-7733 Accepts children up to age 21 who are enrolled in Medicaid or [place] Health Choice; pregnant women with a Medicaid card; and children who have applied for Medicaid or [place] Health Choice, but were declined, whose parents can pay a reduced fee at time of service.  °[facility] ° [address], [place] (336) 641-4533 Patients are seen by appointment only. Walk-ins are not accepted. [facility] will see patients 18 years of age and older. °Monday - Tuesday (8am-5pm) °Most Wednesdays (8:30-5pm) °$30 per visit, cash only  °[facility] ° [address], [place] (336) 641-4533 Patients are seen by appointment only. Walk-ins are not accepted. [facility] will see  patients 18 years of age and older. °One Wednesday Evening (Monthly: Volunteer Based).  $30 per visit, cash only  °[facility]  (919) 537-3737 for adults; Children under age 4, call Graduate Pediatric Dentistry at (919) [phone]. Children aged 4-14, please call (919) 537-3737 to request a pediatric application. ° Dental services are provided in all areas of dental care including fillings, crowns and bridges, complete and partial dentures, implants, gum treatment, root canals, and extractions. Preventive care is also provided. Treatment is provided to both adults and children. °Patients are selected via a lottery and there is often a waiting list. °  °[facility] [address], °[place] ° (336) 763-8833 [email or web address] °  °[facility] [address], [place], [place] (336)723-1848, Ext. 123 Second and Fourth Thursday of each month, opens at 6:30 AM; Clinic ends at 9 AM.  Patients are seen on a first-come first-served basis, and a limited number are seen during each clinic.  ° °[facility] ° [address], [place], [place] (336) 723-7904   Eligibility Requirements °You must have lived in [place], [place], or [place] counties for at least the last three months. °  You cannot be eligible for state or federal sponsored healthcare insurance, including Veterans Administration, Medicaid, or Medicare. °  You generally cannot be eligible for healthcare insurance through your employer.  °  How to apply: °Eligibility screenings are held every Tuesday and Wednesday afternoon from 1:00 pm until 4:00 pm. You do not need an appointment for the interview!  °[facility] [address], [place], [place] 336-631-2330   °[facility]  336-342-8273   °[facility]  336-703-3100   °[facility]  336-570-6415   ° °Behavioral Health Resources in the Community: °Intensive Outpatient  Programs °Organization         Address  Phone  Notes  °[facility] [address], [place], [place] 336-878-6098   ° [facility] [address], [place], [place] 336-832-9800   °[facility] [address], [place], [place] ° 336-882-2125   °[facility] [address],  °[place], [place] 1-800-853-5163 or 336-641-4981   °Substance Abuse Resources °Organization         Address  Phone  Notes  °[facility]    336-882-2125   °[facility]  336-784-9470   °[facility]  336-285-9073   °[facility]  336-845-3988   °Residential & Outpatient Substance Abuse Program  1-800-659-3381   °Psychological Services °Organization         Address  Phone  Notes  °[facility]  336- 832-9600   °[facility]  336- 378-7881   °[facility] [address], [place] 1-800-853-5163 or 336-641-4981   ° °Mobile Crisis Teams °Organization         Address  Phone  Notes  °[facility], Mobile Crisis Care Unit  1-877-626-1772   °Assertive °[facility] ° [address] [place], [place] 336-834-9664   °Sharon DeEsch [address] °[place] 336-554-5454   ° °Self-Help/Support Groups °Organization         Address  Phone             Notes  °[facility] - variety of support groups  336- 373-1402 Call for more information  °Narcotics Anonymous (NA), [facility] [address], °[place]  2 meetings at this location  ° °Residential Treatment Programs °Organization         Address  Phone  Notes  °[facility] [address],    °[place]  1-866-801-8205   °[facility] ° [address], [place], [place] 704-293-8524   °[facility] [address], [place] 336-845-3988 Admissions: 8am-3pm M-F  °[facility] [address]    °[place], [place]  336-841-1104   °[facility] [address], [place], [place] 336-379-7146   °[facility] [address]  °[place], [place] 336-285-9073   °[facility] - Intensive Outpatient [address], [place], [place] 336-852-3033   °[facility] [address]  °[place], [place] 1-877-615-2722 or 336-784-9470   °[facility] [address] [place], [place] 336-227-7417 Accepts Medicaid  °[facility] [address]  °[place] 1-800-659-3381 Substance Abuse/Addiction Treatment  ° °[place] Behavioral Health Resources °Organization         Address  Phone  Notes  °[facility]  (888) 581-9988   °Julie Brannon, PhD [address] [employer], [place]   (336) 349-5553 or (336) 951-0000   °[facility]   [address] °[place], [place] (336) 349-4454   °[facility] [address], [place], [place] (336) 342-8316 Insurance/Medicaid/sponsorship through [facility]  °[facility] [address]                                    [place], [place] (336) 342-8316 Therapy/tele-psych/case  °[facility] [address]  ° [place], [place] (336) 349-2233    °Dr. Arfeen  (336) 349-4544   °[facility] [place]  [employer] [place] [facility] 1) [address], [place] °2) [address], [place] °3)  [address], [place] (336) 349-3220 °(336) 342-7768 ° °(336) 342-8140   °[place] [facility] (336) 342-1394 or (336) 342-3537 (After Hours)    ° ° ° °

## 2015-09-02 NOTE — ED Notes (Signed)
Pt st's she has been going through a lot.  Pt denies SI or HI.   Pt c/o headache

## 2015-09-02 NOTE — ED Notes (Signed)
Pt. arrived with PTAR from home reports panic /anxiety attack this evening , calm at arrival , respirations unlabored / denies pain or discomfort .

## 2015-09-02 NOTE — ED Provider Notes (Signed)
CSN: [id number]     Arrival date & time 09/02/15  2123 History  By signing my name below, I, Shelby Duncan, attest that this documentation has been prepared under the direction and in the presence of  Federated Department StoresHanna Patel-Mills, PA-C. Electronically Signed: Doreatha MartinEva Duncan, ED Scribe. 09/02/2015. 9:59 PM.    Chief Complaint  Patient presents with  . Panic Attack   The history is provided by the patient. No language interpreter was used.    HPI Comments: Shelby Duncan is a 24 y.o. female with h/o depression and anxiety who presents to the Emergency Department complaining of moderate anxiety and depression onset today. Pt states she is "overwhelmed" and "has a lot of stuff on her mind". She reports she is in the process of gaining custody of her son, recently had a miscarriage, and has trouble getting along with her mother. She states she has not spoken to her mother in over a year. Pt states she just wants to get along with her mother. She states she is here today because she has been thinking about the troubles with her mother. She states she has been eating, sleeping, eating and showering adequately. She states she is active during the day and interacts with her sisters. Pt also complains of frontal HA, dizziness and CP. She reports her current symptoms feel similar to past anxiety attacks. [facility] and is medicated for anxiety and depression. She denies HI, SI.    Past Medical History  Diagnosis Date  . Infection     UTI   Past Surgical History  Procedure Laterality Date  . No past surgeries     Family History  Problem Relation Age of Onset  . Cancer Maternal Grandmother     breast  . Cancer Paternal Grandfather    Social History  Substance Use Topics  . Smoking status: Former Smoker    Types: Cigarettes    Quit date: 08/18/2006  . Smokeless tobacco: Never Used     Comment: age 24  . Alcohol Use: No   OB History    Gravida Para Term Preterm AB TAB SAB Ectopic  Multiple Living   2 1 1  0 0 0 0 0 0 1     Review of Systems  Cardiovascular: Positive for chest pain.  Neurological: Positive for dizziness and headaches.  Psychiatric/Behavioral: Negative for suicidal ideas, sleep disturbance and self-injury. The patient is nervous/anxious.    Allergies  Review of patient's allergies indicates no known allergies.  Home Medications   Prior to Admission medications   Medication Sig Start Date End Date Taking? Authorizing Provider  LORazepam (ATIVAN) 1 MG tablet Take 1 tablet (1 mg total) by mouth 3 (three) times daily as needed for anxiety. 09/02/15   Murrel Freet Patel-Mills, PA-C  Prenat w/o A-[id number]-[id number]-FA-DHA (CITRANATAL HARMONY) 30-1-260 MG CAPS Take 1 tablet by mouth daily. Patient not taking: Reported on 08/26/2015 08/19/15   Rachelle A Denney, CNM   BP 135/77 mmHg  Pulse 88  Temp(Src) 98.4 F (36.9 C) (Oral)  Resp 18  SpO2 100% Physical Exam  Constitutional: She is oriented to person, place, and time. She appears well-developed and well-nourished.  HENT:  Head: Normocephalic and atraumatic.  No throat swelling or respiratory distress.  Eyes: Conjunctivae and EOM are normal. Pupils are equal, round, and reactive to light.  Neck: Normal range of motion. Neck supple.  Cardiovascular: Normal rate, regular rhythm and normal heart sounds.   No murmur heard. Regular rate and rhythm.  No murmur. No tachycardia.  Pulmonary/Chest: Effort normal and breath sounds normal. No respiratory distress. She has no wheezes. She has no rales.  Lungs CTA bilaterally.   Abdominal: She exhibits no distension.  Musculoskeletal: Normal range of motion.  Neurological: She is alert and oriented to person, place, and time.  Skin: Skin is warm and dry.  Psychiatric: Her speech is normal and behavior is normal. She is not actively hallucinating. She exhibits a depressed mood. She expresses no homicidal and no suicidal ideation. She expresses no suicidal plans and no  homicidal plans.  No hallucinations. No HI or SI.   Nursing note and vitals reviewed.  ED Course  Procedures (including critical care time) DIAGNOSTIC STUDIES: Oxygen Saturation is 100% on RA, normal by my interpretation.    COORDINATION OF CARE: 9:56 PM Discussed treatment plan with pt at bedside and pt agreed to plan. Will administer Ativan.    Labs Review Labs Reviewed  POC URINE PREG, ED    I have personally reviewed and evaluated these lab results as part of my medical decision-making. ED ECG REPORT   Date: 09/02/2015  Rate: 89  Rhythm: normal sinus rhythm  QRS Axis: normal  Intervals: normal  ST/T Wave abnormalities: normal  Conduction Disutrbances:none  Narrative Interpretation:   Old EKG Reviewed: none available  I have personally reviewed the EKG tracing and agree with the computerized printout as noted.   MDM   Final diagnoses:  Anxiety   Patient presents to the emergency department complaining of symptoms consistent with anxiety.  Patient has a history of same with similar episodes.  The patient is resting comfortably, in no apparent distress and asymptomatic.  Labs, ECG and vital signs reviewed.  No exophthalmos, pregnancy test negative, no signs of UTI.  Stress reducing mechanisms discussed including caffeine intake.  Patient has been referred to psychiatric services for follow-up.  Discharged with a PRN trid prescription for 6 doses of Ativan .  She denied any SI, HI, or hallucinations.  Filed Vitals:   09/02/15 2123  BP: 135/77  Pulse: 88  Temp: 98.4 F (36.9 C)  Resp: 18   Meds given in ED:  Medications  LORazepam (ATIVAN) tablet 1 mg (1 mg Oral Given 09/02/15 2238)    Discharge Medication List as of 09/02/2015 10:32 PM    START taking these medications   Details  LORazepam (ATIVAN) 1 MG tablet Take 1 tablet (1 mg total) by mouth 3 (three) times daily as needed for anxiety., Starting 09/02/2015, Until Discontinued, Print       I personally  performed the services described in this documentation, which was scribed in my presence. The recorded information has been reviewed and is accurate.   Catha Gosselin, PA-C 09/02/15 2322  Melene Plan, DO 09/03/15 1344

## 2015-09-09 ENCOUNTER — Ambulatory Visit: Payer: Medicaid Other | Admitting: Obstetrics

## 2015-09-10 ENCOUNTER — Ambulatory Visit: Payer: Medicaid Other | Admitting: Obstetrics

## 2015-09-28 NOTE — L&D Delivery Note (Signed)
  Patient is 25 y.o. G3P1011 [id number] admitted in SOL, hx of depression and ?schizophrenia, not taking any pysch medications   Delivery Note At 4:20 PM a viable female was delivered via SVD (Presentation: ROA ;  ).  APGAR: 9, 9; weight pending.   Placenta status: spontaneously delivered intact.  Cord: 3vc with the following complications: none  Cord pH: n/a  Anesthesia:  none Episiotomy:  none Lacerations:  none Suture Repair: no repair Est. Blood Loss (mL):  <50  Mom to postpartum.  Baby to Couplet care / Skin to Skin.  Tillman Sersngela C Ercole Georg 06/30/2016, 4:32 PM      Upon arrival patient was complete and pushing. She pushed with good maternal effort to deliver a healthy baby girl. Baby delivered without difficulty, was noted to have good tone and place on maternal abdomen for oral suctioning, drying and stimulation. Delayed cord clamping performed. Placenta delivered intact with 3V cord. Vaginal canal and perineum was inspected and found to be intact; hemostatic. Pitocin was started and uterus massaged until bleeding slowed. Counts of sharps, instruments, and lap pads were all correct.   Tillman SersAngela C Devery [place], DO PGY-1 10/4/20174:32 PM

## 2015-10-30 ENCOUNTER — Inpatient Hospital Stay (HOSPITAL_COMMUNITY)
Admission: AD | Admit: 2015-10-30 | Discharge: 2015-10-30 | Disposition: A | Discharge status: Home / Self Care | Payer: Medicaid Other | Source: Ambulatory Visit | Attending: Obstetrics & Gynecology | Admitting: Obstetrics & Gynecology

## 2015-10-30 DIAGNOSIS — Z3201 Encounter for pregnancy test, result positive: Secondary | ICD-10-CM | POA: Diagnosis not present

## 2015-10-30 DIAGNOSIS — Z3200 Encounter for pregnancy test, result unknown: Secondary | ICD-10-CM | POA: Diagnosis present

## 2015-10-30 NOTE — MAU Note (Signed)
Pt presents stating that she wants to confirm her pregnancy. Pt states that she took 5 home pregnancy test and they were all positive. LMP 10/03/15. Denies any pain or vaginal bleeding

## 2015-10-31 ENCOUNTER — Ambulatory Visit (INDEPENDENT_AMBULATORY_CARE_PROVIDER_SITE_OTHER): Payer: Medicaid Other | Admitting: Certified Nurse Midwife

## 2015-10-31 ENCOUNTER — Encounter: Payer: Self-pay | Admitting: *Deleted

## 2015-10-31 VITALS — BP 109/70 | HR 93 | Wt 230.0 lb

## 2015-10-31 DIAGNOSIS — Z3201 Encounter for pregnancy test, result positive: Secondary | ICD-10-CM | POA: Diagnosis not present

## 2015-10-31 DIAGNOSIS — Z3200 Encounter for pregnancy test, result unknown: Secondary | ICD-10-CM

## 2015-10-31 LAB — POCT URINE PREGNANCY: PREG TEST UR: POSITIVE — AB

## 2015-10-31 LAB — TSH: TSH: 1.729 u[IU]/mL (ref 0.350–4.500)

## 2015-10-31 MED ORDER — VITAFOL GUMMIES 3.33-0.333-34.8 MG PO CHEW: 3.0000 | CHEWABLE_TABLET | Freq: Every day | ORAL | Status: DC

## 2015-10-31 NOTE — Progress Notes (Signed)
Patient ID: Shelby Duncan, female   DOB: [birth year], 25 y.o.   MRN: [id number]  Chief Complaint  Patient presents with  . Possible Pregnancy    +UPT at home, in office for upt confirm    HPI Shelby Duncan is a 25 y.o. female.  Here for confirmation of pregnancy.  Was seen in [facility]U on 10/30/15 with positive pregnancy testing.   UPT today is also positive.  Has been trying for pregnancy since her loss in late November.  Is quite shocked today to be pregnant.  Discussed hx of depression, not currently on any medication but states that she is doing well.  Discussed [facility] if needed.  Discussed the normalcy of being nervous after previous miscarriage for subsequent pregnancy.  Encouragement given.  Discussed with patient normal activities while pregnant.  Encouraged f/u if needed before next exam.  Patient verbalized understanding.  Rx prenatal vitamins started.  States occasionally nauseated, declines any medications at this time.  Denies any vaginal bleeding or abnormal discharge.     HPI  Past Medical History  Diagnosis Date  . Infection     UTI    Past Surgical History  Procedure Laterality Date  . No past surgeries      Family History  Problem Relation Age of Onset  . Cancer Maternal Grandmother     breast  . Cancer Paternal Grandfather     Social History Social History  Substance Use Topics  . Smoking status: Former Smoker    Types: Cigarettes    Quit date: 08/18/2006  . Smokeless tobacco: Never Used     Comment: age 67  . Alcohol Use: No    No Known Allergies  Current Outpatient Prescriptions  Medication Sig Dispense Refill  . Omega-3 Fatty Acids (FISH OIL) 1000 MG CAPS Take by mouth.    . prenatal vitamin w/FE, FA (PRENATAL 1 + 1) 27-1 MG TABS tablet Take 1 tablet by mouth daily at 12 noon.    Marland Kitchen LORazepam (ATIVAN) 1 MG tablet Take 1 tablet (1 mg total) by mouth 3 (three) times daily as needed for anxiety. (Patient not taking: Reported on 10/31/2015) 6  tablet 0  . Prenatal Vit-Fe Phos-FA-Omega (VITAFOL GUMMIES) 3.33-0.333-34.8 MG CHEW Chew 3 tablets by mouth daily. 90 tablet 12   No current facility-administered medications for this visit.    Review of Systems Review of Systems Constitutional: negative for fatigue and weight loss Respiratory: negative for cough and wheezing Cardiovascular: negative for chest pain, fatigue and palpitations Gastrointestinal: negative for abdominal pain and change in bowel habits Genitourinary:negative Integument/breast: negative for nipple discharge Musculoskeletal:negative for myalgias Neurological: negative for gait problems and tremors Behavioral/Psych: negative for abusive relationship, depression Endocrine: negative for temperature intolerance     Blood pressure 109/70, pulse 93, weight 230 lb (104.327 kg), last menstrual period 09/28/2015, unknown if currently breastfeeding.  Physical Exam Physical Exam: deferred   100% of 15 min visit spent on counseling and coordination of care.   Data Reviewed Previous medical hx, meds, labs  Assessment     Early pregnancy with recent hx of miscarriage     Plan    Orders Placed This Encounter  Procedures  . Culture, OB Urine  . US OB Comp Less 14 Wks    Standing Status: Future     Number of Occurrences:      Standing Expiration Date: 12/28/2016    Scheduling Instructions:     For 2-3 weeks from now.    Order  Specific Question:  Reason for Exam (SYMPTOM  OR DIAGNOSIS REQUIRED)    Answer:  viability, dating    Order Specific Question:  Preferred imaging location?    Answer:  Internal  . Obstetric panel  . HIV antibody  . Hemoglobinopathy evaluation  . Varicella zoster antibody, IgG  . VITAMIN D 25 Hydroxy (Vit-D Deficiency, Fractures)  . TSH  . B-HCG Quant  . POCT urine pregnancy   Meds ordered this encounter  Medications  . prenatal vitamin w/FE, FA (PRENATAL 1 + 1) 27-1 MG TABS tablet    Sig: Take 1 tablet by mouth daily at 12 noon.   . Omega-3 Fatty Acids (FISH OIL) 1000 MG CAPS    Sig: Take by mouth.  . Prenatal Vit-Fe Phos-FA-Omega (VITAFOL GUMMIES) 3.33-0.333-34.8 MG CHEW    Sig: Chew 3 tablets by mouth daily.    Dispense:  90 tablet    Refill:  12      Follow up in 6 weeks for NOB visit.

## 2015-11-01 LAB — HCG, QUANTITATIVE, PREGNANCY: hCG, Beta Chain, Quant, S: 229.1 m[IU]/mL — ABNORMAL HIGH

## 2015-11-01 LAB — VITAMIN D 25 HYDROXY (VIT D DEFICIENCY, FRACTURES): Vit D, 25-Hydroxy: 17 ng/mL — ABNORMAL LOW (ref 30–100)

## 2015-11-01 LAB — HIV ANTIBODY (ROUTINE TESTING W REFLEX): HIV: NONREACTIVE

## 2015-11-02 LAB — CULTURE, OB URINE

## 2015-11-03 LAB — OBSTETRIC PANEL
ANTIBODY SCREEN: NEGATIVE
Basophils Absolute: 0 10*3/uL (ref 0.0–0.1)
Basophils Relative: 0 % (ref 0–1)
EOS ABS: 0.1 10*3/uL (ref 0.0–0.7)
EOS PCT: 1 % (ref 0–5)
HCT: 34.5 % — ABNORMAL LOW (ref 36.0–46.0)
HEMOGLOBIN: 11 g/dL — AB (ref 12.0–15.0)
Hepatitis B Surface Ag: NEGATIVE
LYMPHS ABS: 2 10*3/uL (ref 0.7–4.0)
Lymphocytes Relative: 15 % (ref 12–46)
MCH: 26.2 pg (ref 26.0–34.0)
MCHC: 31.9 g/dL (ref 30.0–36.0)
MCV: 82.1 fL (ref 78.0–100.0)
MONO ABS: 0.9 10*3/uL (ref 0.1–1.0)
MONOS PCT: 7 % (ref 3–12)
MPV: 11 fL (ref 8.6–12.4)
Neutro Abs: 10.1 10*3/uL — ABNORMAL HIGH (ref 1.7–7.7)
Neutrophils Relative %: 77 % (ref 43–77)
Platelets: 383 10*3/uL (ref 150–400)
RBC: 4.2 MIL/uL (ref 3.87–5.11)
RDW: 14.5 % (ref 11.5–15.5)
RH TYPE: POSITIVE
RUBELLA: 1.51 {index} — AB (ref <0.90)
WBC: 13.1 10*3/uL — ABNORMAL HIGH (ref 4.0–10.5)

## 2015-11-03 LAB — VARICELLA ZOSTER ANTIBODY, IGG: Varicella IgG: 13.7 Index (ref <135.00)

## 2015-11-04 ENCOUNTER — Other Ambulatory Visit: Payer: Medicaid Other

## 2015-11-04 DIAGNOSIS — O3680X0 Pregnancy with inconclusive fetal viability, not applicable or unspecified: Secondary | ICD-10-CM

## 2015-11-04 LAB — HEMOGLOBINOPATHY EVALUATION
HGB A2 QUANT: 3.1 % (ref 2.2–3.2)
HGB A: 96.9 % (ref 96.8–97.8)
HGB F QUANT: 0 % (ref 0.0–2.0)
HGB S QUANTITAION: 0 %
Hemoglobin Other: 0 %

## 2015-11-05 ENCOUNTER — Other Ambulatory Visit: Payer: Self-pay | Admitting: Certified Nurse Midwife

## 2015-11-05 DIAGNOSIS — O09291 Supervision of pregnancy with other poor reproductive or obstetric history, first trimester: Secondary | ICD-10-CM

## 2015-11-05 LAB — HCG, QUANTITATIVE, PREGNANCY: hCG, Beta Chain, Quant, S: 1393.2 m[IU]/mL — ABNORMAL HIGH

## 2015-11-20 ENCOUNTER — Other Ambulatory Visit: Payer: Self-pay | Admitting: Certified Nurse Midwife

## 2015-11-20 ENCOUNTER — Ambulatory Visit (HOSPITAL_COMMUNITY)
Admission: RE | Admit: 2015-11-20 | Discharge: 2015-11-20 | Disposition: A | Discharge status: Home / Self Care | Payer: Medicaid Other | Source: Ambulatory Visit | Attending: Certified Nurse Midwife | Admitting: Certified Nurse Midwife

## 2015-11-20 ENCOUNTER — Other Ambulatory Visit: Payer: Medicaid Other

## 2015-11-20 DIAGNOSIS — Z3A01 Less than 8 weeks gestation of pregnancy: Secondary | ICD-10-CM | POA: Insufficient documentation

## 2015-11-20 DIAGNOSIS — O09291 Supervision of pregnancy with other poor reproductive or obstetric history, first trimester: Secondary | ICD-10-CM

## 2015-11-20 DIAGNOSIS — O208 Other hemorrhage in early pregnancy: Secondary | ICD-10-CM | POA: Diagnosis not present

## 2015-11-20 DIAGNOSIS — O30041 Twin pregnancy, dichorionic/diamniotic, first trimester: Secondary | ICD-10-CM | POA: Diagnosis not present

## 2015-11-20 DIAGNOSIS — Z3201 Encounter for pregnancy test, result positive: Secondary | ICD-10-CM

## 2015-11-20 DIAGNOSIS — Z36 Encounter for antenatal screening of mother: Secondary | ICD-10-CM | POA: Insufficient documentation

## 2015-11-23 ENCOUNTER — Other Ambulatory Visit: Payer: Self-pay | Admitting: Certified Nurse Midwife

## 2015-11-23 DIAGNOSIS — O09291 Supervision of pregnancy with other poor reproductive or obstetric history, first trimester: Secondary | ICD-10-CM

## 2015-11-27 ENCOUNTER — Other Ambulatory Visit: Payer: Self-pay | Admitting: Certified Nurse Midwife

## 2015-11-27 DIAGNOSIS — O30009 Twin pregnancy, unspecified number of placenta and unspecified number of amniotic sacs, unspecified trimester: Secondary | ICD-10-CM

## 2015-12-04 ENCOUNTER — Ambulatory Visit (HOSPITAL_COMMUNITY)
Admission: RE | Admit: 2015-12-04 | Discharge: 2015-12-04 | Disposition: A | Discharge status: Home / Self Care | Payer: Medicaid Other | Source: Ambulatory Visit | Attending: Certified Nurse Midwife | Admitting: Certified Nurse Midwife

## 2015-12-04 DIAGNOSIS — O09291 Supervision of pregnancy with other poor reproductive or obstetric history, first trimester: Secondary | ICD-10-CM

## 2015-12-05 ENCOUNTER — Encounter: Payer: Medicaid Other | Admitting: Certified Nurse Midwife

## 2015-12-10 ENCOUNTER — Ambulatory Visit (HOSPITAL_COMMUNITY): Admission: RE | Admit: 2015-12-10 | Payer: Medicaid Other | Source: Ambulatory Visit

## 2015-12-10 ENCOUNTER — Ambulatory Visit (HOSPITAL_COMMUNITY)
Admission: RE | Admit: 2015-12-10 | Discharge: 2015-12-10 | Disposition: A | Discharge status: Home / Self Care | Payer: Medicaid Other | Source: Ambulatory Visit | Attending: Certified Nurse Midwife | Admitting: Certified Nurse Midwife

## 2015-12-10 ENCOUNTER — Other Ambulatory Visit: Payer: Self-pay | Admitting: Certified Nurse Midwife

## 2015-12-10 DIAGNOSIS — O09291 Supervision of pregnancy with other poor reproductive or obstetric history, first trimester: Secondary | ICD-10-CM

## 2015-12-10 DIAGNOSIS — O3680X0 Pregnancy with inconclusive fetal viability, not applicable or unspecified: Secondary | ICD-10-CM

## 2015-12-10 DIAGNOSIS — Z36 Encounter for antenatal screening of mother: Secondary | ICD-10-CM | POA: Insufficient documentation

## 2015-12-10 DIAGNOSIS — O30001 Twin pregnancy, unspecified number of placenta and unspecified number of amniotic sacs, first trimester: Secondary | ICD-10-CM | POA: Insufficient documentation

## 2015-12-10 DIAGNOSIS — Z3A01 Less than 8 weeks gestation of pregnancy: Secondary | ICD-10-CM | POA: Insufficient documentation

## 2015-12-11 ENCOUNTER — Encounter: Payer: Self-pay | Admitting: Certified Nurse Midwife

## 2015-12-11 ENCOUNTER — Ambulatory Visit (INDEPENDENT_AMBULATORY_CARE_PROVIDER_SITE_OTHER): Payer: Medicaid Other | Admitting: Certified Nurse Midwife

## 2015-12-11 VITALS — BP 129/70 | HR 90 | Temp 98.3°F | Wt 216.0 lb

## 2015-12-11 DIAGNOSIS — Z3481 Encounter for supervision of other normal pregnancy, first trimester: Secondary | ICD-10-CM | POA: Diagnosis not present

## 2015-12-11 DIAGNOSIS — O30003 Twin pregnancy, unspecified number of placenta and unspecified number of amniotic sacs, third trimester: Secondary | ICD-10-CM

## 2015-12-11 DIAGNOSIS — O219 Vomiting of pregnancy, unspecified: Secondary | ICD-10-CM

## 2015-12-11 DIAGNOSIS — O30009 Twin pregnancy, unspecified number of placenta and unspecified number of amniotic sacs, unspecified trimester: Secondary | ICD-10-CM | POA: Insufficient documentation

## 2015-12-11 LAB — POCT URINALYSIS DIPSTICK
Bilirubin, UA: NEGATIVE
Blood, UA: NEGATIVE
LEUKOCYTES UA: NEGATIVE
NITRITE UA: NEGATIVE
PH UA: 5
PROTEIN UA: NEGATIVE
Spec Grav, UA: 1.025
UROBILINOGEN UA: 4

## 2015-12-11 MED ORDER — DOXYLAMINE-PYRIDOXINE 10-10 MG PO TBEC: DELAYED_RELEASE_TABLET | ORAL | Status: DC

## 2015-12-11 NOTE — Addendum Note (Signed)
Addended by: Henriette CombsHATTON, ANDREA L on: 12/11/2015 04:58 PM   Modules accepted: Orders

## 2015-12-11 NOTE — Progress Notes (Signed)
Subjective:    Shelby Duncan is being seen today for her first obstetrical visit.  This is not a planned pregnancy. She is at 5834w4d gestation. Her obstetrical history is significant for none, twin pregnancy. Relationship with FOB: not currently involved. Patient does intend to breast feed. Pregnancy history fully reviewed.  The information documented in the HPI was reviewed and verified.  Menstrual History: OB History    Gravida Para Term Preterm AB TAB SAB Ectopic Multiple Living   3 1 1  0 1 0 1 0 0 1      Menarche age: 25 years of age  [name] last menstrual period was 09/28/2015.    Past Medical History  Diagnosis Date  . Infection     UTI    Past Surgical History  Procedure Laterality Date  . No past surgeries       (Not in a hospital admission) No Known Allergies  Social History  Substance Use Topics  . Smoking status: Former Smoker    Types: Cigarettes    Quit date: 08/18/2006  . Smokeless tobacco: Never Used     Comment: age 25  . Alcohol Use: No    Family History  Problem Relation Age of Onset  . Cancer Maternal Grandmother     breast  . Cancer Paternal Grandfather      Review of Systems Constitutional: negative for weight loss Gastrointestinal: + for nausea & vomiting Genitourinary:negative for genital lesions and vaginal discharge and dysuria Musculoskeletal:negative for back pain Behavioral/Psych: negative for abusive relationship, depression, illegal drug usage and tobacco use    Objective:    BP 129/70 mmHg  Pulse 90  Temp(Src) 98.3 F (36.8 C)  Wt 216 lb (97.977 kg)  LMP 09/28/2015 General Appearance:    Alert, cooperative, no distress, appears stated age  Head:    Normocephalic, without obvious abnormality, atraumatic  Eyes:    PERRL, conjunctiva/corneas clear, EOM's intact, fundi    benign, both eyes  Ears:    Normal TM's and external ear canals, both ears  Nose:   Nares normal, septum midline, mucosa normal, no drainage    or sinus  tenderness  Throat:   Lips, mucosa, and tongue normal; teeth and gums normal  Neck:   Supple, symmetrical, trachea midline, no adenopathy;    thyroid:  no enlargement/tenderness/nodules; no carotid   bruit or JVD  Back:     Symmetric, no curvature, ROM normal, no CVA tenderness  Lungs:     Clear to auscultation bilaterally, respirations unlabored  Chest Wall:    No tenderness or deformity   Heart:    Regular rate and rhythm, S1 and S2 normal, no murmur, rub   or gallop  Breast Exam:    No tenderness, masses, or nipple abnormality  Abdomen:     Soft, non-tender, bowel sounds active all four quadrants,    no masses, no organomegaly  Genitalia:    Normal female without lesion, discharge or tenderness  Extremities:   Extremities normal, atraumatic, no cyanosis or edema  Pulses:   2+ and symmetric all extremities  Skin:   Skin color, texture, turgor normal, no rashes or lesions  Lymph nodes:   Cervical, supraclavicular, and axillary nodes normal  Neurologic:   CNII-XII intact, normal strength, sensation and reflexes    throughout      Cervix; long, thick, closed and posterior.  FHR seen with twin A on bedside US, ?fetal pole of twin B  Lab Review Urine pregnancy test Labs reviewed  yes Radiologic studies reviewed yes Assessment:    Pregnancy at [redacted]w[redacted]d weeks   Twin gestation, ?reabsorbtion of twin B  N&V in early pregnancy  Plan:      Prenatal vitamins.  Counseling provided regarding continued use of seat belts, cessation of alcohol consumption, smoking or use of illicit drugs; infection precautions i.e., influenza/TDAP immunizations, toxoplasmosis,CMV, parvovirus, listeria and varicella; workplace safety, exercise during pregnancy; routine dental care, safe medications, sexual activity, hot tubs, saunas, pools, travel, caffeine use, fish and methlymercury, potential toxins, hair treatments, varicose veins Weight gain recommendations per IOM guidelines reviewed: underweight/BMI< 18.5-->  gain 28 - 40 lbs; normal weight/BMI 18.5 - 24.9--> gain 25 - 35 lbs; overweight/BMI 25 - 29.9--> gain 15 - 25 lbs; obese/BMI >30->gain  11 - 20 lbs Problem list reviewed and updated. FIRST/CF mutation testing/NIPT/QUAD SCREEN/fragile X/Ashkenazi Jewish population testing/Spinal muscular atrophy discussed: requested. Role of ultrasound in pregnancy discussed; fetal survey: requested. Amniocentesis discussed: not indicated. VBAC calculator score: VBAC consent form provided Meds ordered this encounter  Medications  . Doxylamine-Pyridoxine (DICLEGIS) 10-10 MG TBEC    Sig: Take 1 tablet with breakfast and lunch.  Take 2 tablets at bedtime.    Dispense:  100 tablet    Refill:  4   Orders Placed This Encounter  Procedures  . NuSwab Vaginitis Plus (VG+)    Follow up in 4 weeks. 50% of 30 min visit spent on counseling and coordination of care.

## 2015-12-13 LAB — NUSWAB VAGINITIS PLUS (VG+)
ATOPOBIUM VAGINAE: HIGH {score} — AB
BVAB 2: HIGH Score — AB
CANDIDA ALBICANS, NAA: NEGATIVE
CANDIDA GLABRATA, NAA: NEGATIVE
Chlamydia trachomatis, NAA: NEGATIVE
Megasphaera 1: HIGH Score — AB
NEISSERIA GONORRHOEAE, NAA: NEGATIVE
TRICH VAG BY NAA: NEGATIVE

## 2015-12-16 ENCOUNTER — Other Ambulatory Visit: Payer: Self-pay | Admitting: Certified Nurse Midwife

## 2015-12-16 DIAGNOSIS — N760 Acute vaginitis: Principal | ICD-10-CM

## 2015-12-16 DIAGNOSIS — B9689 Other specified bacterial agents as the cause of diseases classified elsewhere: Secondary | ICD-10-CM

## 2015-12-16 MED ORDER — METRONIDAZOLE 0.75 % VA GEL: 1.0000 | Freq: Two times a day (BID) | VAGINAL | Status: DC

## 2015-12-18 LAB — PAP IG W/ RFLX HPV ASCU: PAP SMEAR COMMENT: 0

## 2015-12-18 LAB — HPV DNA PROBE HIGH RISK, AMPLIFIED: HPV, HIGH-RISK: NEGATIVE

## 2015-12-25 ENCOUNTER — Encounter (HOSPITAL_COMMUNITY): Payer: Self-pay | Admitting: *Deleted

## 2015-12-25 ENCOUNTER — Inpatient Hospital Stay (HOSPITAL_COMMUNITY)
Admission: AD | Admit: 2015-12-25 | Discharge: 2015-12-25 | Disposition: A | Discharge status: Home / Self Care | Payer: Medicaid Other | Source: Ambulatory Visit | Attending: Obstetrics | Admitting: Obstetrics

## 2015-12-25 DIAGNOSIS — Z87891 Personal history of nicotine dependence: Secondary | ICD-10-CM | POA: Diagnosis not present

## 2015-12-25 DIAGNOSIS — Z3A12 12 weeks gestation of pregnancy: Secondary | ICD-10-CM | POA: Diagnosis not present

## 2015-12-25 DIAGNOSIS — O219 Vomiting of pregnancy, unspecified: Secondary | ICD-10-CM | POA: Diagnosis not present

## 2015-12-25 DIAGNOSIS — O210 Mild hyperemesis gravidarum: Secondary | ICD-10-CM | POA: Diagnosis not present

## 2015-12-25 DIAGNOSIS — G44209 Tension-type headache, unspecified, not intractable: Secondary | ICD-10-CM | POA: Insufficient documentation

## 2015-12-25 DIAGNOSIS — O2341 Unspecified infection of urinary tract in pregnancy, first trimester: Secondary | ICD-10-CM | POA: Diagnosis not present

## 2015-12-25 LAB — URINALYSIS, ROUTINE W REFLEX MICROSCOPIC
Glucose, UA: NEGATIVE mg/dL
Ketones, ur: 15 mg/dL — AB
Nitrite: POSITIVE — AB
PH: 5.5 (ref 5.0–8.0)
Protein, ur: 30 mg/dL — AB

## 2015-12-25 LAB — URINE MICROSCOPIC-ADD ON

## 2015-12-25 MED ORDER — PROMETHAZINE HCL 25 MG PO TABS
25.0000 mg | ORAL_TABLET | Freq: Once | ORAL | Status: AC
  Administered 2015-12-25: 25 mg via ORAL
  Filled 2015-12-25: qty 1

## 2015-12-25 MED ORDER — CEPHALEXIN 500 MG PO CAPS: 500.0000 mg | ORAL_CAPSULE | Freq: Four times a day (QID) | ORAL | Status: DC

## 2015-12-25 MED ORDER — METOCLOPRAMIDE HCL 5 MG/ML IJ SOLN
5.0000 mg | Freq: Once | INTRAMUSCULAR | Status: AC
  Administered 2015-12-25: 5 mg via INTRAVENOUS
  Filled 2015-12-25: qty 2

## 2015-12-25 MED ORDER — DEXAMETHASONE SODIUM PHOSPHATE 10 MG/ML IJ SOLN
10.0000 mg | Freq: Once | INTRAMUSCULAR | Status: AC
  Administered 2015-12-25: 10 mg via INTRAVENOUS
  Filled 2015-12-25: qty 1

## 2015-12-25 MED ORDER — DIPHENHYDRAMINE HCL 50 MG/ML IJ SOLN
12.5000 mg | Freq: Once | INTRAMUSCULAR | Status: AC
  Administered 2015-12-25: 12.5 mg via INTRAVENOUS
  Filled 2015-12-25: qty 1

## 2015-12-25 MED ORDER — METOCLOPRAMIDE HCL 10 MG PO TABS: 10.0000 mg | ORAL_TABLET | Freq: Three times a day (TID) | ORAL | Status: DC

## 2015-12-25 MED ORDER — DEXTROSE 5 % IN LACTATED RINGERS IV BOLUS
1000.0000 mL | Freq: Once | INTRAVENOUS | Status: AC
  Administered 2015-12-25: 1000 mL via INTRAVENOUS

## 2015-12-25 NOTE — MAU Provider Note (Signed)
History     CSN: [id number]  Arrival date and time: 12/25/15 1046   First Provider Initiated Contact with Patient 12/25/15 1130      Chief Complaint  Patient presents with  . Morning Sickness  . Dehydration   HPI   Ms.Shelby Duncan is a 25 y.o. female G3P1011 at [redacted]w[redacted]d presenting with Nausea and vomiting that has been present for the entire pregnancy. She is not taking anything for symptoms at home.   In the last 24 hours she has vomited 2-3 times. Today she just has extreme nausea and is unable to eat.   She denies pain at this time. She denies abdominal pain, she denies back pain.   OB History    Gravida Para Term Preterm AB TAB SAB Ectopic Multiple Living   0 1 0 1 0 0 1      Past Medical History  Diagnosis Date  . Infection     UTI    Past Surgical History  Procedure Laterality Date  . No past surgeries      Family History  Problem Relation Age of Onset  . Cancer Maternal Grandmother     breast  . Cancer Paternal Grandfather     Social History  Substance Use Topics  . Smoking status: Former Smoker    Types: Cigarettes    Quit date: 08/18/2006  . Smokeless tobacco: Never Used     Comment: age 43  . Alcohol Use: No    Allergies: No Known Allergies  Prescriptions prior to admission  Medication Sig Dispense Refill Last Dose  . Prenatal Vit-Min-FA-Fish Oil ([employer] PRENATAL GUMMY PO) Take 3 each by mouth daily.   Past Week at Unknown time  . Doxylamine-Pyridoxine (DICLEGIS) 10-10 MG TBEC Take 1 tablet with breakfast and lunch.  Take 2 tablets at bedtime. (Patient not taking: Reported on 12/25/2015) 100 tablet 4 Not Taking at Unknown time  . LORazepam (ATIVAN) 1 MG tablet Take 1 tablet (1 mg total) by mouth 3 (three) times daily as needed for anxiety. (Patient not taking: Reported on 10/31/2015) 6 tablet 0 Not Taking  . metroNIDAZOLE (METROGEL VAGINAL) 0.75 % vaginal gel Place 1 Applicatorful vaginally 2 (two) times daily. (Patient not taking: Reported  on 12/25/2015) 70 g 0 Not Taking at Unknown time   Results for orders placed or performed during the hospital encounter of 12/25/15 (from the past 48 hour(s))  Urinalysis, Routine w reflex microscopic (not at [facility])     Status: Abnormal   Collection Time: 12/25/15 11:09 AM  Result Value Ref Range   Color, Urine YELLOW YELLOW   APPearance CLOUDY (A) CLEAR   Specific Gravity, Urine >1.030 (H) 1.005 - 1.030   pH 5.5 5.0 - 8.0   Glucose, UA NEGATIVE NEGATIVE mg/dL   Hgb urine dipstick MODERATE (A) NEGATIVE   Bilirubin Urine SMALL (A) NEGATIVE   Ketones, ur 15 (A) NEGATIVE mg/dL   Protein, ur 30 (A) NEGATIVE mg/dL   Nitrite POSITIVE (A) NEGATIVE   Leukocytes, UA MODERATE (A) NEGATIVE  Urine microscopic-add on     Status: Abnormal   Collection Time: 12/25/15 11:09 AM  Result Value Ref Range   Squamous Epithelial / LPF 6-30 (A) NONE SEEN   WBC, UA TOO NUMEROUS TO COUNT 0 - 5 WBC/hpf   RBC / HPF 0-5 0 - 5 RBC/hpf   Bacteria, UA FEW (A) NONE SEEN   Urine-Other MUCOUS PRESENT     Review of Systems  Constitutional: Negative for  fever.  Gastrointestinal: Positive for nausea and vomiting. Negative for abdominal pain and diarrhea.  Genitourinary: Positive for urgency. Negative for dysuria and frequency.       + pressure Only drips of urine at a singe time.   Neurological: Positive for dizziness.   Physical Exam   Blood pressure 116/64, pulse 87, temperature 98.7 F (37.1 C), temperature source Oral, resp. rate 16, height 5\' 9"  (1.753 m), weight 209 lb 9.6 oz (95.074 kg), last menstrual period 09/28/2015, SpO2 100 %, unknown if currently breastfeeding.  Physical Exam  Constitutional: She is oriented to person, place, and time. She appears well-developed.  Non-toxic appearance. She does not have a sickly appearance. She does not appear ill. No distress.  Cardiovascular: Normal rate.   Respiratory: Effort normal.  GI: Soft. She exhibits no distension. There is no tenderness. There is no  rebound.  Musculoskeletal: Normal range of motion.  Neurological: She is alert and oriented to person, place, and time.  Skin: Skin is warm. She is not diaphoretic.  Psychiatric: Her behavior is normal.    MAU Course  Procedures  None  MDM  Urine culture pending.  Phenergan 12.5 PO Patient continues to complain of tension like headache.  Reglan 5 mg IV Decadron 10 mg Benadryl 12.5 mg LR bolus  HR and active fetus noted on bedside US. Patient rates her pain 0/10 at the time of discharge PO challenge: pass.   Assessment and Plan   A:  1. Nausea and vomiting in pregnancy   2. UTI in pregnancy, first trimester   3. Tension headache     P:  Discharge home in stable condition.  RX: Keflex, Reglan Return to MAU if symptoms worsen  Ok to take tylenol as directed on the bottle  Urine culture pending  Increase PO fluids   Duane LopeJennifer I Rasch, NP 12/25/2015 7:15 PM

## 2015-12-25 NOTE — Discharge Instructions (Signed)
Morning Sickness °Morning sickness is when you feel sick to your stomach (nauseous) during pregnancy. You may feel sick to your stomach and throw up (vomit). You may feel sick in the morning, but you can feel this way any time of day. Some women feel very sick to their stomach and cannot stop throwing up (hyperemesis gravidarum). °HOME CARE °· Only take medicines as told by your doctor. °· Take multivitamins as told by your doctor. Taking multivitamins before getting pregnant can stop or lessen the harshness of morning sickness. °· Eat dry toast or unsalted crackers before getting out of bed. °· Eat 5 to 6 small meals a day. °· Eat dry and bland foods like rice and baked potatoes. °· Do not drink liquids with meals. Drink between meals. °· Do not eat greasy, fatty, or spicy foods. °· Have someone cook for you if the smell of food causes you to feel sick or throw up. °· If you feel sick to your stomach after taking prenatal vitamins, take them at night or with a snack. °· Eat protein when you need a snack (nuts, yogurt, cheese). °· Eat unsweetened gelatins for dessert. °· Wear a bracelet used for sea sickness (acupressure wristband). °· Go to a doctor that puts thin needles into certain body points (acupuncture) to improve how you feel. °· Do not smoke. °· Use a humidifier to keep the air in your house free of odors. °· Get lots of fresh air. °GET HELP IF: °· You need medicine to feel better. °· You feel dizzy or lightheaded. °· You are losing weight. °GET HELP RIGHT AWAY IF:  °· You feel very sick to your stomach and cannot stop throwing up. °· You pass out (faint). °MAKE SURE YOU: °· Understand these instructions. °· Will watch your condition. °· Will get help right away if you are not doing well or get worse. °  °This information is not intended to replace advice given to you by your health care provider. Make sure you discuss any questions you have with your health care provider. °  °Document Released: 10/21/2004  Document Revised: 10/04/2014 Document Reviewed: 02/28/2013 °Elsevier Interactive Patient Education ©2016 [employer] ° °Pregnancy and Urinary Tract Infection °A urinary tract infection (UTI) is a bacterial infection of the urinary tract. Infection of the urinary tract can include the ureters, kidneys (pyelonephritis), bladder (cystitis), and urethra (urethritis). All pregnant women should be screened for bacteria in the urinary tract. Identifying and treating a UTI will decrease the risk of preterm labor and developing more serious infections in both the mother and baby. °CAUSES °Bacteria germs cause almost all UTIs.  °RISK FACTORS °Many factors can increase your chances of getting a UTI during pregnancy. These include: °· Having a short urethra. °· Poor toilet and hygiene habits. °· Sexual intercourse. °· Blockage of urine along the urinary tract. °· Problems with the pelvic muscles or nerves. °· Diabetes. °· Obesity. °· Bladder problems after having several children. °· Previous history of UTI. °SIGNS AND SYMPTOMS  °· Pain, burning, or a stinging feeling when urinating. °· Suddenly feeling the need to urinate right away (urgency). °· Loss of bladder control (urinary incontinence). °· Frequent urination, more than is common with pregnancy. °· Lower abdominal or back discomfort. °· Cloudy urine. °· Blood in the urine (hematuria). °· Fever.  °When the kidneys are infected, the symptoms may be: °· Back pain. °· Flank pain on the right side more so than the left. °· Fever. °· Chills. °· Nausea. °·   Vomiting. °DIAGNOSIS  °A urinary tract infection is usually diagnosed through urine tests. Additional tests and procedures are sometimes done. These may include: °· Ultrasound exam of the kidneys, ureters, bladder, and urethra. °· Looking in the bladder with a lighted tube (cystoscopy). °TREATMENT °Typically, UTIs can be treated with antibiotic medicines.  °HOME CARE INSTRUCTIONS  °· Only take over-the-counter or  prescription medicines as directed by your health care provider. If you were prescribed antibiotics, take them as directed. Finish them even if you start to feel better. °· Drink enough fluids to keep your urine clear or pale yellow. °· Do not have sexual intercourse until the infection is gone and your health care provider says it is okay. °· Make sure you are tested for UTIs throughout your pregnancy. These infections often come back.  °Preventing a UTI in the Future °· Practice good toilet habits. Always wipe from front to back. Use the tissue only once. °· Do not hold your urine. Empty your bladder as soon as possible when the urge comes. °· Do not douche or use deodorant sprays. °· Wash with soap and warm water around the genital area and the anus. °· Empty your bladder before and after sexual intercourse. °· Wear underwear with a cotton crotch. °· Avoid caffeine and carbonated drinks. They can irritate the bladder. °· Drink cranberry juice or take cranberry pills. This may decrease the risk of getting a UTI. °· Do not drink alcohol. °· Keep all your appointments and tests as scheduled.  °SEEK MEDICAL CARE IF:  °· Your symptoms get worse. °· You are still having fevers 2 or more days after treatment begins. °· You have a rash. °· You feel that you are having problems with medicines prescribed. °· You have abnormal vaginal discharge. °SEEK IMMEDIATE MEDICAL CARE IF:  °· You have back or flank pain. °· You have chills. °· You have blood in your urine. °· You have nausea and vomiting. °· You have contractions of your uterus. °· You have a gush of fluid from the vagina. °MAKE SURE YOU: °· Understand these instructions.   °· Will watch your condition.   °· Will get help right away if you are not doing well or get worse.   °  °This information is not intended to replace advice given to you by your health care provider. Make sure you discuss any questions you have with your health care provider. °  °Document Released:  01/08/2011 Document Revised: 07/04/2013 Document Reviewed: 04/12/2013 °Elsevier Interactive Patient Education ©2016 [employer] ° °

## 2015-12-25 NOTE — MAU Note (Signed)
Patient states she is 3 months pregnant and "I haven't been able to keep anything down for 3 months."  Patient states at the beginning of her pregnancy she was 240lbs and now weighs 209.6lb.  Patient states this is a twin pregnancy.  Denies and vaginal bleeding, LOF, or discharge.  Denies pain.

## 2015-12-26 LAB — URINE CULTURE: Special Requests: NORMAL

## 2015-12-28 ENCOUNTER — Encounter (HOSPITAL_COMMUNITY): Payer: Self-pay | Admitting: *Deleted

## 2015-12-28 ENCOUNTER — Inpatient Hospital Stay (HOSPITAL_COMMUNITY)
Admission: AD | Admit: 2015-12-28 | Discharge: 2015-12-28 | Disposition: A | Discharge status: Home / Self Care | Payer: Medicaid Other | Source: Ambulatory Visit | Attending: Obstetrics | Admitting: Obstetrics

## 2015-12-28 DIAGNOSIS — R109 Unspecified abdominal pain: Secondary | ICD-10-CM | POA: Diagnosis present

## 2015-12-28 DIAGNOSIS — Z87891 Personal history of nicotine dependence: Secondary | ICD-10-CM | POA: Diagnosis not present

## 2015-12-28 DIAGNOSIS — O2342 Unspecified infection of urinary tract in pregnancy, second trimester: Secondary | ICD-10-CM

## 2015-12-28 DIAGNOSIS — Z3A13 13 weeks gestation of pregnancy: Secondary | ICD-10-CM | POA: Diagnosis not present

## 2015-12-28 DIAGNOSIS — O2341 Unspecified infection of urinary tract in pregnancy, first trimester: Secondary | ICD-10-CM | POA: Insufficient documentation

## 2015-12-28 DIAGNOSIS — O210 Mild hyperemesis gravidarum: Secondary | ICD-10-CM | POA: Insufficient documentation

## 2015-12-28 DIAGNOSIS — O219 Vomiting of pregnancy, unspecified: Secondary | ICD-10-CM

## 2015-12-28 DIAGNOSIS — O26899 Other specified pregnancy related conditions, unspecified trimester: Secondary | ICD-10-CM

## 2015-12-28 LAB — URINE MICROSCOPIC-ADD ON

## 2015-12-28 LAB — URINALYSIS, ROUTINE W REFLEX MICROSCOPIC
BILIRUBIN URINE: NEGATIVE
Glucose, UA: NEGATIVE mg/dL
KETONES UR: NEGATIVE mg/dL
NITRITE: POSITIVE — AB
Protein, ur: 100 mg/dL — AB
SPECIFIC GRAVITY, URINE: 1.025 (ref 1.005–1.030)
pH: 5.5 (ref 5.0–8.0)

## 2015-12-28 MED ORDER — CEFTRIAXONE SODIUM 1 G IJ SOLR
1.0000 g | Freq: Once | INTRAMUSCULAR | Status: AC
  Administered 2015-12-28: 1 g via INTRAMUSCULAR
  Filled 2015-12-28: qty 10

## 2015-12-28 MED ORDER — HYDROMORPHONE HCL 1 MG/ML IJ SOLN
1.0000 mg | Freq: Once | INTRAMUSCULAR | Status: AC
  Administered 2015-12-28: 1 mg via INTRAMUSCULAR

## 2015-12-28 MED ORDER — OXYCODONE-ACETAMINOPHEN 5-325 MG PO TABS
2.0000 | ORAL_TABLET | Freq: Once | ORAL | Status: AC
  Administered 2015-12-28: 2 via ORAL
  Filled 2015-12-28: qty 2

## 2015-12-28 MED ORDER — SODIUM CHLORIDE 0.9 % IV BOLUS (SEPSIS): 1000.0000 mL | Freq: Once | INTRAVENOUS | Status: DC

## 2015-12-28 MED ORDER — ONDANSETRON 8 MG PO TBDP
8.0000 mg | ORAL_TABLET | Freq: Once | ORAL | Status: AC
  Administered 2015-12-28: 8 mg via ORAL
  Filled 2015-12-28: qty 1

## 2015-12-28 MED ORDER — ONDANSETRON HCL 4 MG/2ML IJ SOLN
4.0000 mg | Freq: Once | INTRAMUSCULAR | Status: DC
  Filled 2015-12-28: qty 2

## 2015-12-28 MED ORDER — HYDROMORPHONE HCL 1 MG/ML IJ SOLN
1.0000 mg | Freq: Once | INTRAMUSCULAR | Status: DC
  Filled 2015-12-28: qty 1

## 2015-12-28 MED ORDER — DEXTROSE 5 % IV SOLN
2.0000 g | Freq: Once | INTRAVENOUS | Status: DC
  Filled 2015-12-28: qty 2

## 2015-12-28 MED ORDER — METOCLOPRAMIDE HCL 10 MG PO TABS: 10.0000 mg | ORAL_TABLET | Freq: Three times a day (TID) | ORAL | Status: DC

## 2015-12-28 MED ORDER — CEPHALEXIN 500 MG PO CAPS: 500.0000 mg | ORAL_CAPSULE | Freq: Four times a day (QID) | ORAL | Status: DC

## 2015-12-28 NOTE — MAU Provider Note (Signed)
Chief Complaint: No chief complaint on file.   First Provider Initiated Contact with Patient 12/28/15 1756      SUBJECTIVE HPI: Shelby Duncan is a 25 y.o. G3P1011 at [redacted]w[redacted]d by LMP who presents to maternity admissions via EMS for severe abdominal and back pain with onset today.  She reports the pain is constant in her lower abdomen and right side of her back and is not relieved by position changes, rest, or heating pad.  She has a twins pregnancy with no evidence of FHR with Baby B but normal progression for Baby A.  She was seen 3/30 in MAU for n/v and was diagnosed with UTI but has not yet picked up the abx that were prescribed at that time.  She reports the n/v continues but is improved from 3/30 so she can tolerate some PO food and fluids daily now.  She denies vaginal bleeding, vaginal discharge/itching/burning, urinary symptoms, h/a, dizziness, or fever/chills.     HPI  Past Medical History  Diagnosis Date  . Infection     UTI   Past Surgical History  Procedure Laterality Date  . No past surgeries     Social History   Social History  . Marital Status: Single    Spouse Name: N/A  . Number of Children: N/A  . Years of Education: N/A   Occupational History  . Not on file.   Social History Main Topics  . Smoking status: Former Smoker    Types: Cigarettes    Quit date: 08/18/2006  . Smokeless tobacco: Never Used     Comment: age 38  . Alcohol Use: No  . Drug Use: No  . Sexual Activity: Yes    Birth Control/ Protection: None   Other Topics Concern  . Not on file   Social History Narrative   No current facility-administered medications on file prior to encounter.   Current Outpatient Prescriptions on File Prior to Encounter  Medication Sig Dispense Refill  . Prenatal Vit-Min-FA-Fish Oil ([facility] PRENATAL GUMMY PO) Take 3 each by mouth daily.     No Known Allergies  ROS:  Review of Systems  Constitutional: Negative for fever, chills and fatigue.  Respiratory:  Negative for shortness of breath.   Cardiovascular: Negative for chest pain.  Gastrointestinal: Positive for nausea, vomiting and abdominal pain.  Genitourinary: Positive for pelvic pain. Negative for dysuria, flank pain, vaginal bleeding, vaginal discharge, difficulty urinating and vaginal pain.  Neurological: Negative for dizziness and headaches.  Psychiatric/Behavioral: Negative.      I have reviewed patient's Past Medical Hx, Surgical Hx, Family Hx, Social Hx, medications and allergies.   Physical Exam   Patient Vitals for the past 24 hrs:  BP Temp Temp src Pulse Resp Height Weight  12/28/15 1738 146/68 mmHg 98.2 F (36.8 C) Oral 96 16  (1.753 m) 209 lb (94.802 kg)   Constitutional: Well-developed, well-nourished female in no acute distress.  Cardiovascular: normal rate Respiratory: normal effort GI: Abd soft, non-tender. Pos BS x 4 MS: Extremities nontender, no edema, normal ROM Neurologic: Alert and oriented x 4.  GU: Neg CVAT.  Bimanual exam: Cervix 0/long/high, firm, anterior, neg CMT, uterus nontender, ~13 week size, adnexa without tenderness, enlargement, or mass  FHT 173 by doppler  LAB RESULTS Results for orders placed or performed during the hospital encounter of 12/28/15 (from the past 24 hour(s))  Urinalysis, Routine w reflex microscopic (not at [facility])     Status: Abnormal   Collection Time: 12/28/15  5:45  PM  Result Value Ref Range   Color, Urine YELLOW YELLOW   APPearance CLOUDY (A) CLEAR   Specific Gravity, Urine 1.025 1.005 - 1.030   pH 5.5 5.0 - 8.0   Glucose, UA NEGATIVE NEGATIVE mg/dL   Hgb urine dipstick LARGE (A) NEGATIVE   Bilirubin Urine NEGATIVE NEGATIVE   Ketones, ur NEGATIVE NEGATIVE mg/dL   Protein, ur 161 (A) NEGATIVE mg/dL   Nitrite POSITIVE (A) NEGATIVE   Leukocytes, UA MODERATE (A) NEGATIVE  Urine microscopic-add on     Status: Abnormal   Collection Time: 12/28/15  5:45 PM  Result Value Ref Range   Squamous Epithelial / LPF 0-5  (A) NONE SEEN   WBC, UA TOO NUMEROUS TO COUNT 0 - 5 WBC/hpf   RBC / HPF 6-30 0 - 5 RBC/hpf   Bacteria, UA FEW (A) NONE SEEN   Urine-Other MUCOUS PRESENT     B/POS/-- (02/03 1420)  IMAGING US Ob Comp Less 14 Wks  12/10/2015  CLINICAL DATA:  Evaluate viability.  History of aborted outcome EXAM: TWIN OBSTETRICAL ULTRASOUND <14 WKS COMPARISON:  11/20/2015 FINDINGS: TWIN 1 Intrauterine gestational sac: Visualized/normal in shape. Yolk sac:  Visualized Embryo:  Visualized Cardiac Activity: Visualized Heart Rate: 173 bpm MSD: 53.2  mm   11 w   1  d CRL:   32.1  mm   10 w 1 d                  [place] EDC: 07/06/2016 TWIN 2 Intrauterine gestational sac: Visualized/normal in shape. Yolk sac:  Visualized Embryo:  Not visualized Cardiac Activity: Not visualized Heart Rate:  bpm MSD: 25.5  Mm   7 w   4  d CRL:     mm    w  d                  [place] EDC: Maternal uterus/adnexae: Small subchorionic hemorrhage. No adnexal masses. Trace free fluid. IMPRESSION: Twin gestation. Fetal pole and heart rate 173 beats per minute noted with twin A. No fetal pole currently with twin B with somewhat smaller gestational sac. This could be followed with repeat ultrasound in 10-14 days. Small subchorionic hemorrhage. Electronically Signed   By: Charlett Nose M.D.   On: 12/10/2015 17:01    MAU Management/MDM: Ordered labs and reviewed results.  Since pt did not pick up Rx for Keflex for UTI, likely source of pain is untreated UTI.  With no worsening, and in fact documented improvement in nausea/vomiting and in the absence of CVA tenderness, no evidence of pyelonephritis today.  Pt given oral Percocet 5/325 x 2 tablets but vomited immediately.  IV ordered but after several attempts by RN and nurse-anesthetist, no IV started.  Pt given Rocephin 1 g IM, Dilaudid 1 mg IM, and Zofran 8 mg ODT.  D/C home. Pt to pick up Rx for Keflex and Reglan as prescribed on 3/30 and start tomorrow.  Teaching done about n/v in pregnancy.  Return to MAU if UTI  symptoms not improved in 48 hours.  F/U in office as scheduled.  Pt stable at time of discharge.  ASSESSMENT 1. UTI in pregnancy, antepartum, second trimester   2. Abdominal pain affecting pregnancy, antepartum   3. Nausea and vomiting during pregnancy prior to [redacted] weeks gestation     PLAN Discharge home   Medication List    TAKE these medications        cephALEXin 500 MG capsule  Commonly known as:  KEFLEX  Take 1 capsule (500 mg total) by mouth 4 (four) times daily.     [facility] PRENATAL GUMMY PO  Take 3 each by mouth daily.     metoCLOPramide 10 MG tablet  Commonly known as:  REGLAN  Take 1 tablet (10 mg total) by mouth 3 (three) times daily before meals.       Follow-up Information    Follow up with HARPER,CHARLES A, MD.   Specialty:  Obstetrics and Gynecology   Why:  As scheduled, Return to MAU as needed for emergencies   Contact information:   [address] [place] [place] [id number] [phone]       Sharen CounterLisa Leftwich-Kirby Certified Nurse-Midwife 12/28/2015  8:53 PM

## 2015-12-28 NOTE — MAU Note (Signed)
Unable to start IV after multiple attempts by RNs, CNM, and CRNA. Provider notified and meds changed to IM and oral instead.

## 2015-12-28 NOTE — Discharge Instructions (Signed)
Pregnancy and Urinary Tract Infection °A urinary tract infection (UTI) is a bacterial infection of the urinary tract. Infection of the urinary tract can include the ureters, kidneys (pyelonephritis), bladder (cystitis), and urethra (urethritis). All pregnant women should be screened for bacteria in the urinary tract. Identifying and treating a UTI will decrease the risk of preterm labor and developing more serious infections in both the mother and baby. °CAUSES °Bacteria germs cause almost all UTIs.  °RISK FACTORS °Many factors can increase your chances of getting a UTI during pregnancy. These include: °· Having a short urethra. °· Poor toilet and hygiene habits. °· Sexual intercourse. °· Blockage of urine along the urinary tract. °· Problems with the pelvic muscles or nerves. °· Diabetes. °· Obesity. °· Bladder problems after having several children. °· Previous history of UTI. °SIGNS AND SYMPTOMS  °· Pain, burning, or a stinging feeling when urinating. °· Suddenly feeling the need to urinate right away (urgency). °· Loss of bladder control (urinary incontinence). °· Frequent urination, more than is common with pregnancy. °· Lower abdominal or back discomfort. °· Cloudy urine. °· Blood in the urine (hematuria). °· Fever.  °When the kidneys are infected, the symptoms may be: °· Back pain. °· Flank pain on the right side more so than the left. °· Fever. °· Chills. °· Nausea. °· Vomiting. °DIAGNOSIS  °A urinary tract infection is usually diagnosed through urine tests. Additional tests and procedures are sometimes done. These may include: °· Ultrasound exam of the kidneys, ureters, bladder, and urethra. °· Looking in the bladder with a lighted tube (cystoscopy). °TREATMENT °Typically, UTIs can be treated with antibiotic medicines.  °HOME CARE INSTRUCTIONS  °· Only take over-the-counter or prescription medicines as directed by your health care provider. If you were prescribed antibiotics, take them as directed. Finish  them even if you start to feel better. °· Drink enough fluids to keep your urine clear or pale yellow. °· Do not have sexual intercourse until the infection is gone and your health care provider says it is okay. °· Make sure you are tested for UTIs throughout your pregnancy. These infections often come back.  °Preventing a UTI in the Future °· Practice good toilet habits. Always wipe from front to back. Use the tissue only once. °· Do not hold your urine. Empty your bladder as soon as possible when the urge comes. °· Do not douche or use deodorant sprays. °· Wash with soap and warm water around the genital area and the anus. °· Empty your bladder before and after sexual intercourse. °· Wear underwear with a cotton crotch. °· Avoid caffeine and carbonated drinks. They can irritate the bladder. °· Drink cranberry juice or take cranberry pills. This may decrease the risk of getting a UTI. °· Do not drink alcohol. °· Keep all your appointments and tests as scheduled.  °SEEK MEDICAL CARE IF:  °· Your symptoms get worse. °· You are still having fevers 2 or more days after treatment begins. °· You have a rash. °· You feel that you are having problems with medicines prescribed. °· You have abnormal vaginal discharge. °SEEK IMMEDIATE MEDICAL CARE IF:  °· You have back or flank pain. °· You have chills. °· You have blood in your urine. °· You have nausea and vomiting. °· You have contractions of your uterus. °· You have a gush of fluid from the vagina. °MAKE SURE YOU: °· Understand these instructions.   °· Will watch your condition.   °· Will get help right away if you are not doing   well or get worse.   °  °This information is not intended to replace advice given to you by your health care provider. Make sure you discuss any questions you have with your health care provider. °  °Document Released: 01/08/2011 Document Revised: 07/04/2013 Document Reviewed: 04/12/2013 °Elsevier Interactive Patient Education ©2016 Elsevier  Inc. °Abdominal Pain During Pregnancy °Abdominal pain is common in pregnancy. Most of the time, it does not cause harm. There are many causes of abdominal pain. Some causes are more serious than others. Some of the causes of abdominal pain in pregnancy are easily diagnosed. Occasionally, the diagnosis takes time to understand. Other times, the cause is not determined. Abdominal pain can be a sign that something is very wrong with the pregnancy, or the pain may have nothing to do with the pregnancy at all. For this reason, always tell your health care provider if you have any abdominal discomfort. °HOME CARE INSTRUCTIONS  °Monitor your abdominal pain for any changes. The following actions may help to alleviate any discomfort you are experiencing: °· Do not have sexual intercourse or put anything in your vagina until your symptoms go away completely. °· Get plenty of rest until your pain improves. °· Drink clear fluids if you feel nauseous. Avoid solid food as long as you are uncomfortable or nauseous. °· Only take over-the-counter or prescription medicine as directed by your health care provider. °· Keep all follow-up appointments with your health care provider. °SEEK IMMEDIATE MEDICAL CARE IF: °· You are bleeding, leaking fluid, or passing tissue from the vagina. °· You have increasing pain or cramping. °· You have persistent vomiting. °· You have painful or bloody urination. °· You have a fever. °· You notice a decrease in your baby's movements. °· You have extreme weakness or feel faint. °· You have shortness of breath, with or without abdominal pain. °· You develop a severe headache with abdominal pain. °· You have abnormal vaginal discharge with abdominal pain. °· You have persistent diarrhea. °· You have abdominal pain that continues even after rest, or gets worse. °MAKE SURE YOU:  °· Understand these instructions. °· Will watch your condition. °· Will get help right away if you are not doing well or get  worse. °  °This information is not intended to replace advice given to you by your health care provider. Make sure you discuss any questions you have with your health care provider. °  °Document Released: 09/13/2005 Document Revised: 07/04/2013 Document Reviewed: 04/12/2013 °Elsevier Interactive Patient Education ©2016 Elsevier Inc. ° °

## 2015-12-28 NOTE — MAU Note (Signed)
Pt. Here via EMS due to pain in her lower abdomen and back. Back pain began yesterday earlier today started to have lower abdominal pain that is constant. Denies LOF or bleeding. Pt. States she was here last week and was sick. Notes that she saw a Midwife in Dr. Thomes LollingHarpers office before last week and is scheduled to go back on April 14. Here for evaluation. FHR 176 via doppler.

## 2016-01-05 ENCOUNTER — Telehealth: Payer: Self-pay | Admitting: *Deleted

## 2016-01-05 NOTE — Telephone Encounter (Signed)
Patient states she was given a vaginal gel for BV and she had a reaction- she had redness and irritation and swelling of the vulva. 1:30 Patient has stopped the medication. Advised her to use OTC Benadryl for hystamine reaction and soaks for comfort. She has an appointment Friday and we will assess her then. Will let Rachelle know and if she has anything to add will call her.

## 2016-01-09 ENCOUNTER — Encounter: Payer: Medicaid Other | Admitting: Certified Nurse Midwife

## 2016-01-09 ENCOUNTER — Ambulatory Visit (INDEPENDENT_AMBULATORY_CARE_PROVIDER_SITE_OTHER): Payer: Medicaid Other | Admitting: Certified Nurse Midwife

## 2016-01-09 VITALS — BP 115/78 | HR 90 | Wt 208.0 lb

## 2016-01-09 DIAGNOSIS — O30002 Twin pregnancy, unspecified number of placenta and unspecified number of amniotic sacs, second trimester: Secondary | ICD-10-CM

## 2016-01-09 DIAGNOSIS — O3112X0 Continuing pregnancy after spontaneous abortion of one fetus or more, second trimester, not applicable or unspecified: Secondary | ICD-10-CM

## 2016-01-09 DIAGNOSIS — Z3482 Encounter for supervision of other normal pregnancy, second trimester: Secondary | ICD-10-CM

## 2016-01-09 LAB — POCT URINALYSIS DIPSTICK
BILIRUBIN UA: NEGATIVE
GLUCOSE UA: NEGATIVE
KETONES UA: NEGATIVE
Nitrite, UA: NEGATIVE
Protein, UA: NEGATIVE
UROBILINOGEN UA: NEGATIVE
pH, UA: 8.5

## 2016-01-09 NOTE — Progress Notes (Signed)
  Subjective:    Shelby Duncan is a 25 y.o. female being seen today for her obstetrical visit. She is at 9257w5d gestation. Patient reports: no complaints.  Problem List Items Addressed This Visit    None    Visit Diagnoses    Encounter for supervision of other normal pregnancy in second trimester    -  Primary    Relevant Orders    POCT urinalysis dipstick (Completed)    Twin pregnancy with fetal loss and retention of one fetus or not applicable, second trimester        Relevant Orders    US MFM OB COMP + 14 WK      Patient Active Problem List   Diagnosis Date Noted  . Twin pregnancy w antenatal problem 12/11/2015    Objective:     BP 115/78 mmHg  Pulse 90  Wt 208 lb (94.348 kg)  LMP 09/28/2015 Uterine Size: Below umbilicus   FHR: 150  Assessment:    Pregnancy @ 3557w5d  weeks Doing well    Plan:    Problem list reviewed and updated. Labs reviewed.  Follow up in 4 weeks. FIRST/CF mutation testing/NIPT/QUAD SCREEN/fragile X/Ashkenazi Jewish population testing/Spinal muscular atrophy discussed: requested. Role of ultrasound in pregnancy discussed; fetal survey: ordered. Amniocentesis discussed: not indicated. 50% of 15 minute visit spent on counseling and coordination of care.

## 2016-01-16 ENCOUNTER — Ambulatory Visit (INDEPENDENT_AMBULATORY_CARE_PROVIDER_SITE_OTHER): Payer: Medicaid Other | Admitting: Certified Nurse Midwife

## 2016-01-16 VITALS — BP 125/73 | HR 98 | Wt 213.0 lb

## 2016-01-16 DIAGNOSIS — Z3492 Encounter for supervision of normal pregnancy, unspecified, second trimester: Secondary | ICD-10-CM

## 2016-01-16 DIAGNOSIS — O3110X0 Continuing pregnancy after spontaneous abortion of one fetus or more, unspecified trimester, not applicable or unspecified: Secondary | ICD-10-CM | POA: Insufficient documentation

## 2016-01-16 DIAGNOSIS — F410 Panic disorder [episodic paroxysmal anxiety] without agoraphobia: Secondary | ICD-10-CM

## 2016-01-16 LAB — POCT URINALYSIS DIPSTICK
BILIRUBIN UA: NEGATIVE
GLUCOSE UA: NEGATIVE
KETONES UA: NEGATIVE
Leukocytes, UA: NEGATIVE
Nitrite, UA: NEGATIVE
Protein, UA: NEGATIVE
RBC UA: NEGATIVE
SPEC GRAV UA: 1.01
Urobilinogen, UA: NEGATIVE
pH, UA: 7

## 2016-01-16 MED ORDER — ALPRAZOLAM 0.5 MG PO TABS: 0.5000 mg | ORAL_TABLET | Freq: Every evening | ORAL | Status: DC | PRN

## 2016-01-16 NOTE — Progress Notes (Signed)
  Subjective:    Kandice Moosakiesha N Hrdlicka is a 25 y.o. female being seen today for her obstetrical visit. She is at 6790w5d gestation. Patient reports: no bleeding, no contractions, no cramping, no leaking and anxiety.  States that she is having issues with her son, states that she had 2 anxiety attacks yesterday.  Denies homicidal/suidical ideations.  Discussed POC for anxiety with Dr. Clearance CootsHarper who agrees with the plan to try xanax for a short period of time.    Problem List Items Addressed This Visit    None    Visit Diagnoses    Anxiety attack    -  Primary    Relevant Medications    ALPRAZolam (XANAX) 0.5 MG tablet    Prenatal care, second trimester        Relevant Orders    POCT urinalysis dipstick (Completed)      Patient Active Problem List   Diagnosis Date Noted  . Twin pregnancy w antenatal problem 12/11/2015    Objective:     BP 125/73 mmHg  Pulse 98  Wt 213 lb (96.616 kg)  LMP 09/28/2015 Uterine Size: Below umbilicus   Fetal heart activity seen with bedside US  Assessment:    Pregnancy @ 3790w5d  weeks Anxiety  attacks   Vanishing twin syndrome Plan:    Problem list reviewed and updated. Labs reviewed.  Follow up in 4 weeks. FIRST/CF mutation testing/NIPT/QUAD SCREEN/fragile X/Ashkenazi Jewish population testing/Spinal muscular atrophy discussed: requested. Role of ultrasound in pregnancy discussed; fetal survey: requested. Amniocentesis discussed: not indicated. 50% of 15 minute visit spent on counseling and coordination of care.

## 2016-01-16 NOTE — Progress Notes (Signed)
Patient has a lot of personal things going on & she became overwhelmed.

## 2016-01-21 ENCOUNTER — Emergency Department (HOSPITAL_COMMUNITY)
Admission: EM | Admit: 2016-01-21 | Discharge: 2016-01-21 | Disposition: A | Discharge status: Home / Self Care | Payer: Medicaid Other | Attending: Emergency Medicine | Admitting: Emergency Medicine

## 2016-01-21 ENCOUNTER — Encounter (HOSPITAL_COMMUNITY): Payer: Self-pay | Admitting: *Deleted

## 2016-01-21 DIAGNOSIS — O2342 Unspecified infection of urinary tract in pregnancy, second trimester: Secondary | ICD-10-CM | POA: Insufficient documentation

## 2016-01-21 DIAGNOSIS — Z87891 Personal history of nicotine dependence: Secondary | ICD-10-CM | POA: Diagnosis not present

## 2016-01-21 DIAGNOSIS — F410 Panic disorder [episodic paroxysmal anxiety] without agoraphobia: Secondary | ICD-10-CM

## 2016-01-21 DIAGNOSIS — Z3A00 Weeks of gestation of pregnancy not specified: Secondary | ICD-10-CM | POA: Diagnosis not present

## 2016-01-21 DIAGNOSIS — O99342 Other mental disorders complicating pregnancy, second trimester: Secondary | ICD-10-CM | POA: Insufficient documentation

## 2016-01-21 DIAGNOSIS — O219 Vomiting of pregnancy, unspecified: Secondary | ICD-10-CM | POA: Insufficient documentation

## 2016-01-21 DIAGNOSIS — Z79899 Other long term (current) drug therapy: Secondary | ICD-10-CM | POA: Diagnosis not present

## 2016-01-21 DIAGNOSIS — Z3490 Encounter for supervision of normal pregnancy, unspecified, unspecified trimester: Secondary | ICD-10-CM

## 2016-01-21 DIAGNOSIS — N390 Urinary tract infection, site not specified: Secondary | ICD-10-CM

## 2016-01-21 DIAGNOSIS — O9989 Other specified diseases and conditions complicating pregnancy, childbirth and the puerperium: Secondary | ICD-10-CM | POA: Diagnosis present

## 2016-01-21 DIAGNOSIS — R112 Nausea with vomiting, unspecified: Secondary | ICD-10-CM

## 2016-01-21 DIAGNOSIS — F419 Anxiety disorder, unspecified: Secondary | ICD-10-CM | POA: Diagnosis not present

## 2016-01-21 HISTORY — DX: Obsessive-compulsive disorder, unspecified: F42.9

## 2016-01-21 HISTORY — DX: Post-traumatic stress disorder, unspecified: F43.10

## 2016-01-21 HISTORY — DX: Depression, unspecified: F32.A

## 2016-01-21 HISTORY — DX: Major depressive disorder, single episode, unspecified: F32.9

## 2016-01-21 LAB — BASIC METABOLIC PANEL
Anion gap: 9 (ref 5–15)
BUN: 7 mg/dL (ref 6–20)
CALCIUM: 9.3 mg/dL (ref 8.9–10.3)
CO2: 22 mmol/L (ref 22–32)
CREATININE: 0.5 mg/dL (ref 0.44–1.00)
Chloride: 107 mmol/L (ref 101–111)
GFR calc Af Amer: >60 mL/min (ref >60)
GFR calc non Af Amer: >60 mL/min (ref >60)
GLUCOSE: 91 mg/dL (ref 65–99)
Potassium: 3.8 mmol/L (ref 3.5–5.1)
Sodium: 138 mmol/L (ref 135–145)

## 2016-01-21 LAB — CBC WITH DIFFERENTIAL/PLATELET
Basophils Absolute: 0 10*3/uL (ref 0.0–0.1)
Basophils Relative: 0 %
EOS PCT: 1 %
Eosinophils Absolute: 0.2 10*3/uL (ref 0.0–0.7)
HEMATOCRIT: 32.2 % — AB (ref 36.0–46.0)
Hemoglobin: 10.6 g/dL — ABNORMAL LOW (ref 12.0–15.0)
LYMPHS PCT: 6 %
Lymphs Abs: 1.1 10*3/uL (ref 0.7–4.0)
MCH: 27 pg (ref 26.0–34.0)
MCHC: 32.9 g/dL (ref 30.0–36.0)
MCV: 82.1 fL (ref 78.0–100.0)
MONO ABS: 1.2 10*3/uL — AB (ref 0.1–1.0)
MONOS PCT: 7 %
NEUTROS ABS: 15.2 10*3/uL — AB (ref 1.7–7.7)
Neutrophils Relative %: 86 %
PLATELETS: 327 10*3/uL (ref 150–400)
RBC: 3.92 MIL/uL (ref 3.87–5.11)
RDW: 14.5 % (ref 11.5–15.5)
WBC: 17.7 10*3/uL — ABNORMAL HIGH (ref 4.0–10.5)

## 2016-01-21 LAB — URINE MICROSCOPIC-ADD ON

## 2016-01-21 LAB — URINALYSIS, ROUTINE W REFLEX MICROSCOPIC
BILIRUBIN URINE: NEGATIVE
Glucose, UA: NEGATIVE mg/dL
HGB URINE DIPSTICK: NEGATIVE
KETONES UR: 15 mg/dL — AB
Nitrite: POSITIVE — AB
PROTEIN: NEGATIVE mg/dL
Specific Gravity, Urine: 1.024 (ref 1.005–1.030)
pH: 6.5 (ref 5.0–8.0)

## 2016-01-21 MED ORDER — CVS PRENATAL GUMMY 0.4-113.5 MG PO CHEW: 1.0000 | CHEWABLE_TABLET | Freq: Every day | ORAL | Status: DC

## 2016-01-21 MED ORDER — NITROFURANTOIN MONOHYD MACRO 100 MG PO CAPS: 100.0000 mg | ORAL_CAPSULE | Freq: Two times a day (BID) | ORAL | Status: DC

## 2016-01-21 MED ORDER — DOXYLAMINE-PYRIDOXINE 10-10 MG PO TBEC: 20.0000 | DELAYED_RELEASE_TABLET | Freq: Every day | ORAL | Status: DC

## 2016-01-21 MED ORDER — SODIUM CHLORIDE 0.9 % IV BOLUS (SEPSIS)
1000.0000 mL | Freq: Once | INTRAVENOUS | Status: AC
  Administered 2016-01-21: 1000 mL via INTRAVENOUS

## 2016-01-21 MED ORDER — NITROFURANTOIN MONOHYD MACRO 100 MG PO CAPS
100.0000 mg | ORAL_CAPSULE | Freq: Once | ORAL | Status: AC
  Administered 2016-01-21: 100 mg via ORAL
  Filled 2016-01-21: qty 1

## 2016-01-21 NOTE — ED Provider Notes (Signed)
CSN: [id number]     Arrival date & time 01/21/16  [id number] History   First MD Initiated Contact with Patient 01/21/16 0320     Chief Complaint  Patient presents with  . Anxiety  . Emesis     (Consider location/radiation/quality/duration/timing/severity/associated sxs/prior Treatment) HPI   Patient to the ER with PMH of PTSD, hx of anxiety attacks, OCD and depression by EMS with complaints of an anxiety attack, N/V. She has had N/V for 2 days unknown amount of multiple NBNB episodes of vomiting. She reports not being able to keep down any fluids. She is not sure of what triggered her panic attack that happened this evening but she is 4 months pregnant with twins. She was originally told that she is pregnant with twins but her most recent appointment has diagnosed her with vanishing twins.   She was seen on 01/16/2016 and discussed her anxiety attacks with her OB doctor, they wrote her for Xanax.  EMS reports that when they got to the house they could hear the patient in the streets. She was hyperventilating and tachycardic originally but has since calmed down. She is feeling some anxiety still but no longer having an anxiety attack. She report snot being able to eat or drink and that this is her biggest concern. Denies abdominal pain, fevers, vaginal bleeding, dysuria, CP, SOB, back pain, weakness, fatigue, rash.  Past Medical History  Diagnosis Date  . Infection     UTI  . PTSD (post-traumatic stress disorder)   . OCD (obsessive compulsive disorder)   . Depression    Past Surgical History  Procedure Laterality Date  . No past surgeries     Family History  Problem Relation Age of Onset  . Cancer Maternal Grandmother     breast  . Cancer Paternal Grandfather    Social History  Substance Use Topics  . Smoking status: Former Smoker    Types: Cigarettes    Quit date: 08/18/2006  . Smokeless tobacco: Never Used     Comment: age 25  . Alcohol Use: No   OB History    Gravida Para Term  Preterm AB TAB SAB Ectopic Multiple Living   3 1 1  0 1 0 1 0 0 1     Review of Systems  Review of Systems All other systems negative except as documented in the HPI. All pertinent positives and negatives as reviewed in the HPI.   Allergies  Metrogel  Home Medications   Prior to Admission medications   Medication Sig Start Date End Date Taking? Authorizing Provider  ALPRAZolam Prudy Feeler(XANAX) 0.5 MG tablet Take 1 tablet (0.5 mg total) by mouth at bedtime as needed for anxiety. 01/16/16  Yes Rachelle A Denney, CNM  Prenatal Vit-Min-FA-Fish Oil ([facility] PRENATAL GUMMY PO) Take 3 each by mouth daily. Reported on 01/09/2016   Yes Historical Provider, MD  Doxylamine-Pyridoxine (DICLEGIS) 10-10 MG TBEC Take 20 tablets by mouth at bedtime. 01/21/16   Starlit Raburn Neva SeatGreene, PA-C  metoCLOPramide (REGLAN) 10 MG tablet Take 1 tablet (10 mg total) by mouth 3 (three) times daily before meals. Patient not taking: Reported on 01/21/2016 12/28/15   Wilmer FloorLisa A Leftwich-Kirby, CNM  nitrofurantoin, macrocrystal-monohydrate, (MACROBID) 100 MG capsule Take 1 capsule (100 mg total) by mouth 2 (two) times daily. 01/21/16   Kaylise Blakeley Neva SeatGreene, PA-C   BP 126/66 mmHg  Pulse 99  Temp(Src) 98.6 F (37 C) (Oral)  Resp 16  SpO2 98%  LMP 09/28/2015 Physical Exam  Constitutional: She appears well-developed and well-nourished.  No distress.  HENT:  Head: Normocephalic and atraumatic.  Right Ear: Tympanic membrane and ear canal normal.  Left Ear: Tympanic membrane and ear canal normal.  Nose: Nose normal.  Mouth/Throat: Uvula is midline, oropharynx is clear and moist and mucous membranes are normal.  Eyes: Pupils are equal, round, and reactive to light.  Neck: Normal range of motion. Neck supple.  Cardiovascular: Normal rate and regular rhythm.   Pulmonary/Chest: Effort normal.  Abdominal: Soft.  Gravid abdomen  Genitourinary:  Fetal Heart Tone of Baby A is 150.  Musculoskeletal:  No LE swelling  Neurological: She is alert.  Acting at  baseline  Skin: Skin is warm and dry. No rash noted.  Psychiatric: Her speech is normal and behavior is normal. Her mood appears anxious. She is not agitated and not actively hallucinating. She does not exhibit a depressed mood. She expresses no homicidal and no suicidal ideation.  Nursing note and vitals reviewed.   ED Course  Procedures (including critical care time) Labs Review Labs Reviewed  CBC WITH DIFFERENTIAL/PLATELET - Abnormal; Notable for the following:    WBC 17.7 (*)    Hemoglobin 10.6 (*)    HCT 32.2 (*)    Neutro Abs 15.2 (*)    Monocytes Absolute 1.2 (*)    All other components within normal limits  URINALYSIS, ROUTINE W REFLEX MICROSCOPIC (NOT AT [facility]) - Abnormal; Notable for the following:    Color, Urine AMBER (*)    APPearance TURBID (*)    Ketones, ur 15 (*)    Nitrite POSITIVE (*)    Leukocytes, UA LARGE (*)    All other components within normal limits  URINE MICROSCOPIC-ADD ON - Abnormal; Notable for the following:    Squamous Epithelial / LPF 6-30 (*)    Bacteria, UA MANY (*)    All other components within normal limits  URINE CULTURE  BASIC METABOLIC PANEL    Imaging Review No results found. I have personally reviewed and evaluated these images and lab results as part of my medical decision-making.   EKG Interpretation None      MDM   Final diagnoses:  Non-intractable vomiting with nausea, vomiting of unspecified type  Pregnant  UTI (lower urinary tract infection)  Anxiety attack    Patient is doing much better and no longer having a panic attack. Her anxiety was self-limiting and resolved without treatment. Was able to find fetal heart tones for baby A at 150 and was unable to find fetal heart tones for baby B, per reviewing charts she has a vanishing twins syndrome. Her urinalysis shows a large urinary tract infection, I will send out for urine culture and start her on Macrobid. Her CBC shows elevated white blood cell count was consistent  with infection. Her BMP shows no abnormalities which is reassuring..  The patient has Xanax at home but didn't try taking it this evening because she panicked. She says it happens sometimes and she just has anxiety. Declines SI/HI or needing to speak with TTS. She says her n/v has been the whole pregnancy, per Dr. Lynelle Doctor, I. Will start her on Diclegis. Refer back to womens outpatient clinic and to behavioral health.  Medications  sodium chloride 0.9 % bolus 1,000 mL (1,000 mLs Intravenous New Bag/Given 01/21/16 0530)  nitrofurantoin (macrocrystal-monohydrate) (MACROBID) capsule 100 mg (not administered)    I discussed results, diagnoses and plan with [name]. They voice there understanding and questions were answered. We discussed follow-up recommendations and return precautions.  Marlon Pel, PA-C 01/21/16 1610  Devoria Albe, MD 01/21/16 [phone]

## 2016-01-21 NOTE — ED Notes (Signed)
Pt arrives to the ER via [facility] with complaints of anxiety attack, N/V; pt states that she has had N/V for 2 days; pt reports multiple episodes of vomiting and states she is unable to keep water down; pt states that she had a panic attack this pm but does not know what triggered it; EMS states upon their arrival they could hear her in the street; EMS reports initial resp rate in the 60's and pt c/o numbness and tingling; once they were able to calm pt down resp rate returned to normal and pt cramps and tingling slowed down; pt reports that she is 4 months pregnant with twins and that she has been unable to eat and drink over the last few days due to nausea; pt reports that she takes Xanax for anxiety but she has been unable to take it for 3 days due to nausea; pt reports no prenatal vit x 1 week

## 2016-01-21 NOTE — Progress Notes (Signed)
Medicaid [place] access response hx indicates the [facility] pcp is [facility] [address] [facility] [place] [address] [phone]

## 2016-01-21 NOTE — Discharge Instructions (Signed)
Morning Sickness Morning sickness is when you feel sick to your stomach (nauseous) during pregnancy. This nauseous feeling may or may not come with vomiting. It often occurs in the morning but can be a problem any time of day. Morning sickness is most common during the first trimester, but it may continue throughout pregnancy. While morning sickness is unpleasant, it is usually harmless unless you develop severe and continual vomiting (hyperemesis gravidarum). This condition requires more intense treatment.  CAUSES  The cause of morning sickness is not completely known but seems to be related to normal hormonal changes that occur in pregnancy. RISK FACTORS You are at greater risk if you:  Experienced nausea or vomiting before your pregnancy.  Had morning sickness during a previous pregnancy.  Are pregnant with more than one baby, such as twins. TREATMENT  Do not use any medicines (prescription, over-the-counter, or herbal) for morning sickness without first talking to your health care provider. Your health care provider may prescribe or recommend:  Vitamin B6 supplements.  Anti-nausea medicines.  The herbal medicine ginger. HOME CARE INSTRUCTIONS   Only take over-the-counter or prescription medicines as directed by your health care provider.  Taking multivitamins before getting pregnant can prevent or decrease the severity of morning sickness in most women.  Eat a piece of dry toast or unsalted crackers before getting out of bed in the morning.  Eat five or six small meals a day.  Eat dry and bland foods (rice, baked potato). Foods high in carbohydrates are often helpful.  Do not drink liquids with your meals. Drink liquids between meals.  Avoid greasy, fatty, and spicy foods.  Get someone to cook for you if the smell of any food causes nausea and vomiting.  If you feel nauseous after taking prenatal vitamins, take the vitamins at night or with a snack.  Snack on protein  foods (nuts, yogurt, cheese) between meals if you are hungry.  Eat unsweetened gelatins for desserts.  Wearing an acupressure wristband (worn for sea sickness) may be helpful.  Acupuncture may be helpful.  Do not smoke.  Get a humidifier to keep the air in your house free of odors.  Get plenty of fresh air. SEEK MEDICAL CARE IF:   Your home remedies are not working, and you need medicine.  You feel dizzy or lightheaded.  You are losing weight. SEEK IMMEDIATE MEDICAL CARE IF:   You have persistent and uncontrolled nausea and vomiting.  You pass out (faint). MAKE SURE YOU:  Understand these instructions.  Will watch your condition.  Will get help right away if you are not doing well or get worse.   This information is not intended to replace advice given to you by your health care provider. Make sure you discuss any questions you have with your health care provider.   Document Released: 11/04/2006 Document Revised: 09/18/2013 Document Reviewed: 02/28/2013 Elsevier Interactive Patient Education 2016 Elsevier Inc. Urinary Tract Infection Urinary tract infections (UTIs) can develop anywhere along your urinary tract. Your urinary tract is your body's drainage system for removing wastes and extra water. Your urinary tract includes two kidneys, two ureters, a bladder, and a urethra. Your kidneys are a pair of bean-shaped organs. Each kidney is about the size of your fist. They are located below your ribs, one on each side of your spine. CAUSES Infections are caused by microbes, which are microscopic organisms, including fungi, viruses, and bacteria. These organisms are so small that they can only be seen through a microscope.  Bacteria are the microbes that most commonly cause UTIs. SYMPTOMS  Symptoms of UTIs may vary by age and gender of the patient and by the location of the infection. Symptoms in young women typically include a frequent and intense urge to urinate and a painful,  burning feeling in the bladder or urethra during urination. Older women and men are more likely to be tired, shaky, and weak and have muscle aches and abdominal pain. A fever may mean the infection is in your kidneys. Other symptoms of a kidney infection include pain in your back or sides below the ribs, nausea, and vomiting. DIAGNOSIS To diagnose a UTI, your caregiver will ask you about your symptoms. Your caregiver will also ask you to provide a urine sample. The urine sample will be tested for bacteria and white blood cells. White blood cells are made by your body to help fight infection. TREATMENT  Typically, UTIs can be treated with medication. Because most UTIs are caused by a bacterial infection, they usually can be treated with the use of antibiotics. The choice of antibiotic and length of treatment depend on your symptoms and the type of bacteria causing your infection. HOME CARE INSTRUCTIONS  If you were prescribed antibiotics, take them exactly as your caregiver instructs you. Finish the medication even if you feel better after you have only taken some of the medication.  Drink enough water and fluids to keep your urine clear or pale yellow.  Avoid caffeine, tea, and carbonated beverages. They tend to irritate your bladder.  Empty your bladder often. Avoid holding urine for long periods of time.  Empty your bladder before and after sexual intercourse.  After a bowel movement, women should cleanse from front to back. Use each tissue only once. SEEK MEDICAL CARE IF:   You have back pain.  You develop a fever.  Your symptoms do not begin to resolve within 3 days. SEEK IMMEDIATE MEDICAL CARE IF:   You have severe back pain or lower abdominal pain.  You develop chills.  You have nausea or vomiting.  You have continued burning or discomfort with urination. MAKE SURE YOU:   Understand these instructions.  Will watch your condition.  Will get help right away if you are not  doing well or get worse.   This information is not intended to replace advice given to you by your health care provider. Make sure you discuss any questions you have with your health care provider.   Document Released: 06/23/2005 Document Revised: 06/04/2015 Document Reviewed: 10/22/2011 Elsevier Interactive Patient Education [employer]2016 Elsevier Inc.

## 2016-01-23 LAB — URINE CULTURE: SPECIAL REQUESTS: NORMAL

## 2016-01-24 ENCOUNTER — Telehealth: Payer: Self-pay

## 2016-01-24 NOTE — Telephone Encounter (Signed)
Post ED Visit - Positive Culture Follow-up  Culture report reviewed by antimicrobial stewardship pharmacist:  []  Enzo BiNathan Batchelder, Pharm.D. []  Celedonio MiyamotoJeremy Frens, Pharm.D., BCPS []  Garvin FilaMike Maccia, Pharm.D. []  Georgina PillionElizabeth Martin, Pharm.D., BCPS []  PenroseMinh Pham, [address]Ph[address]D., BCPS, AAHIVP []  Estella HuskMichelle Turner, Pharm.D., BCPS, AAHIVP []  Tennis Mustassie Stewart, Pharm.D. []  Sherle Poeob Vincent, [address]Ph[address] Delle Reiningorey Ball Pharm D Positive urine culture Treated with Nitrofurantoin Monohyd Macro, organism sensitive to the same and no further patient follow-up is required at this time.  Jerry CarasCullom, [place] Detienne Burnett 01/24/2016, 9:41 AM

## 2016-01-29 ENCOUNTER — Other Ambulatory Visit: Payer: Self-pay | Admitting: Certified Nurse Midwife

## 2016-02-04 ENCOUNTER — Ambulatory Visit (HOSPITAL_COMMUNITY)
Admission: RE | Admit: 2016-02-04 | Discharge: 2016-02-04 | Disposition: A | Discharge status: Home / Self Care | Payer: Medicaid Other | Source: Ambulatory Visit | Attending: Certified Nurse Midwife | Admitting: Certified Nurse Midwife

## 2016-02-04 ENCOUNTER — Other Ambulatory Visit: Payer: Self-pay | Admitting: Certified Nurse Midwife

## 2016-02-04 DIAGNOSIS — Z36 Encounter for antenatal screening of mother: Secondary | ICD-10-CM | POA: Diagnosis not present

## 2016-02-04 DIAGNOSIS — Z3A18 18 weeks gestation of pregnancy: Secondary | ICD-10-CM

## 2016-02-04 DIAGNOSIS — O30002 Twin pregnancy, unspecified number of placenta and unspecified number of amniotic sacs, second trimester: Secondary | ICD-10-CM

## 2016-02-04 DIAGNOSIS — O99342 Other mental disorders complicating pregnancy, second trimester: Secondary | ICD-10-CM

## 2016-02-04 DIAGNOSIS — O3112X0 Continuing pregnancy after spontaneous abortion of one fetus or more, second trimester, not applicable or unspecified: Principal | ICD-10-CM

## 2016-02-04 DIAGNOSIS — Z3689 Encounter for other specified antenatal screening: Secondary | ICD-10-CM

## 2016-02-06 ENCOUNTER — Ambulatory Visit (INDEPENDENT_AMBULATORY_CARE_PROVIDER_SITE_OTHER): Payer: Medicaid Other | Admitting: Certified Nurse Midwife

## 2016-02-06 VITALS — BP 128/77 | HR 101 | Wt 216.0 lb

## 2016-02-06 DIAGNOSIS — Z3492 Encounter for supervision of normal pregnancy, unspecified, second trimester: Secondary | ICD-10-CM

## 2016-02-06 LAB — POCT URINALYSIS DIPSTICK
Bilirubin, UA: NEGATIVE
Blood, UA: NEGATIVE
GLUCOSE UA: NEGATIVE
Ketones, UA: NEGATIVE
LEUKOCYTES UA: NEGATIVE
NITRITE UA: NEGATIVE
Protein, UA: NEGATIVE
Spec Grav, UA: 1.015
UROBILINOGEN UA: 1
pH, UA: 7

## 2016-02-06 NOTE — Progress Notes (Signed)
Subjective:    Shelby Duncan is a 25 y.o. female being seen today for her obstetrical visit. She is at 7758w5d gestation. Patient reports: no complaints and states that her anxiety is improved and that she is not having nausea and vomiting . Fetal movement: normal.  Problem List Items Addressed This Visit    None    Visit Diagnoses    Prenatal care, second trimester    -  Primary    Relevant Orders    POCT urinalysis dipstick (Completed)      Patient Active Problem List   Diagnosis Date Noted  . 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 Twin pregnancy w antenatal problem 12/11/2015   Objective:    BP 128/77 mmHg  Pulse 101  Wt 216 lb (97.977 kg)  LMP 09/28/2015 FHT: 150 BPM  Uterine Size: size equals dates     Assessment:    Pregnancy @ 4658w5d    Plan:    OBGCT: discussed. Signs and symptoms of preterm labor: discussed.  Labs, problem list reviewed and updated 2 hr GTT planned Follow up in 4 weeks.

## 2016-03-01 ENCOUNTER — Inpatient Hospital Stay (HOSPITAL_COMMUNITY)
Admission: AD | Admit: 2016-03-01 | Discharge: 2016-03-01 | Disposition: A | Discharge status: Home / Self Care | Payer: Medicaid Other | Source: Ambulatory Visit | Attending: Obstetrics | Admitting: Obstetrics

## 2016-03-01 ENCOUNTER — Encounter (HOSPITAL_COMMUNITY): Payer: Self-pay

## 2016-03-01 DIAGNOSIS — F411 Generalized anxiety disorder: Secondary | ICD-10-CM | POA: Diagnosis not present

## 2016-03-01 DIAGNOSIS — F410 Panic disorder [episodic paroxysmal anxiety] without agoraphobia: Secondary | ICD-10-CM | POA: Diagnosis not present

## 2016-03-01 DIAGNOSIS — Z87891 Personal history of nicotine dependence: Secondary | ICD-10-CM | POA: Diagnosis not present

## 2016-03-01 DIAGNOSIS — Z3A22 22 weeks gestation of pregnancy: Secondary | ICD-10-CM | POA: Diagnosis not present

## 2016-03-01 DIAGNOSIS — O99342 Other mental disorders complicating pregnancy, second trimester: Secondary | ICD-10-CM | POA: Diagnosis not present

## 2016-03-01 LAB — URINE MICROSCOPIC-ADD ON: RBC / HPF: NONE SEEN RBC/hpf (ref 0–5)

## 2016-03-01 LAB — URINALYSIS, ROUTINE W REFLEX MICROSCOPIC
Bilirubin Urine: NEGATIVE
Glucose, UA: NEGATIVE mg/dL
Hgb urine dipstick: NEGATIVE
Ketones, ur: 15 mg/dL — AB
Leukocytes, UA: NEGATIVE
Nitrite: NEGATIVE
Protein, ur: 30 mg/dL — AB
Specific Gravity, Urine: 1.025 (ref 1.005–1.030)
pH: 6 (ref 5.0–8.0)

## 2016-03-01 NOTE — MAU Note (Signed)
Pt received by EMS; had panic attack tonight about 2320. Wanted to come and get checked out.

## 2016-03-01 NOTE — MAU Provider Note (Signed)
History   [id number]   Chief Complaint  Patient presents with  . Panic Attack    HPI Shelby Duncan is a 25 y.o. female  G3P1011 at 6342w1d IUP here with report of having a panic attack that was not precipitated by a specific event.  Lasted about 20 minutes.  Reports it being totally resolved at this time.  Here to make sure "baby is okay".  Denies abdominal pain or vaginal bleeding.  +fetal movement.  [name] last menstrual period was 09/28/2015.  OB History  Gravida Para Term Preterm AB SAB TAB Ectopic Multiple Living  3 1 1  0 1 1 0 0 0 1    # Outcome Date GA Lbr Len/2nd Weight Sex Delivery Anes PTL Lv  3 Current           2 SAB 07/31/15          1 Term 05/15/09 5259w0d  7 lb 2.3 oz (3.24 kg) M Vag-Spont None N Y      Past Medical History  Diagnosis Date  . Infection     UTI  . PTSD (post-traumatic stress disorder)   . OCD (obsessive compulsive disorder)   . Depression     Family History  Problem Relation Age of Onset  . Cancer Maternal Grandmother     breast  . Cancer Paternal Grandfather     Social History   Social History  . Marital Status: Single    Spouse Name: N/A  . Number of Children: N/A  . Years of Education: N/A   Social History Main Topics  . Smoking status: Former Smoker    Types: Cigarettes    Quit date: 08/18/2006  . Smokeless tobacco: Never Used     Comment: age 25  . Alcohol Use: No  . Drug Use: No  . Sexual Activity: Yes    Birth Control/ Protection: None   Other Topics Concern  . None   Social History Narrative    Allergies  Allergen Reactions  . Metrogel [Metronidazole] Other (See Comments)    Skin irritation, swelling of vulva    No current facility-administered medications on file prior to encounter.   Current Outpatient Prescriptions on File Prior to Encounter  Medication Sig Dispense Refill  . ALPRAZolam (XANAX) 0.5 MG tablet Take 1 tablet (0.5 mg total) by mouth at bedtime as needed for anxiety. 30 tablet 1  .  Doxylamine-Pyridoxine (DICLEGIS) 10-10 MG TBEC Take 20 tablets by mouth at bedtime. 60 tablet 0  . metoCLOPramide (REGLAN) 10 MG tablet Take 1 tablet (10 mg total) by mouth 3 (three) times daily before meals. 60 tablet 1  . nitrofurantoin, macrocrystal-monohydrate, (MACROBID) 100 MG capsule Take 1 capsule (100 mg total) by mouth 2 (two) times daily. 14 capsule 0  . Prenatal Vit-Min-FA-Fish Oil ([facility] PRENATAL GUMMY) 0.4-113.5 MG CHEW Chew 1 tablet by mouth daily. Reported on 01/09/2016 30 tablet 2     Review of Systems  Respiratory: Negative for chest tightness and shortness of breath.   Cardiovascular: Negative for chest pain.  Genitourinary: Negative for vaginal bleeding and pelvic pain.  Psychiatric/Behavioral: Negative for agitation.  All other systems reviewed and are negative.    Physical Exam   Filed Vitals:   03/01/16 0023 03/01/16 0027  BP: 112/53   Pulse: 89   Temp: 98.2 F (36.8 C)   TempSrc: Oral   Resp: 18   Height:  5\' 9"  (1.753 m)  Weight:  223 lb (101.152 kg)  SpO2:  100%  Physical Exam  Constitutional: She is oriented to person, place, and time. She appears well-developed and well-nourished.  HENT:  Head: Normocephalic.  Neck: Normal range of motion. Neck supple.  Cardiovascular: Normal rate, regular rhythm and normal heart sounds.   Respiratory: Effort normal and breath sounds normal. No respiratory distress.  GI: Soft. There is no tenderness.  Genitourinary: No bleeding in the vagina.  Musculoskeletal: Normal range of motion. She exhibits no edema.  Neurological: She is alert and oriented to person, place, and time.  Skin: Skin is warm and dry.  Psychiatric: Her behavior is normal. Thought content normal. Her mood appears not anxious. Her speech is not rapid and/or pressured.   FHR 158 MAU Course  Procedures   Assessment and Plan  25 y.o. G3P1011 at [redacted]w[redacted]d IUP  Anxiety Attack  Plan: Discharge home Keep scheduled appt with Dr. Ann Lions, CNM 03/01/2016 12:42 AM

## 2016-03-05 ENCOUNTER — Ambulatory Visit (INDEPENDENT_AMBULATORY_CARE_PROVIDER_SITE_OTHER): Payer: Medicaid Other | Admitting: Obstetrics

## 2016-03-05 VITALS — BP 126/70 | HR 92 | Wt 217.0 lb

## 2016-03-05 DIAGNOSIS — Z3492 Encounter for supervision of normal pregnancy, unspecified, second trimester: Secondary | ICD-10-CM

## 2016-03-05 MED ORDER — PRENATE PIXIE 10-0.6-0.4-200 MG PO CAPS: 1.0000 | ORAL_CAPSULE | Freq: Every day | ORAL | Status: DC

## 2016-03-12 ENCOUNTER — Other Ambulatory Visit: Payer: Self-pay | Admitting: Certified Nurse Midwife

## 2016-04-02 ENCOUNTER — Encounter: Payer: Medicaid Other | Admitting: Certified Nurse Midwife

## 2016-04-09 ENCOUNTER — Inpatient Hospital Stay (HOSPITAL_COMMUNITY)
Admission: AD | Admit: 2016-04-09 | Discharge: 2016-04-09 | Disposition: A | Discharge status: Home / Self Care | Payer: Medicaid Other | Source: Ambulatory Visit | Attending: Obstetrics & Gynecology | Admitting: Obstetrics & Gynecology

## 2016-04-09 ENCOUNTER — Encounter (HOSPITAL_COMMUNITY): Payer: Self-pay | Admitting: Certified Nurse Midwife

## 2016-04-09 DIAGNOSIS — O4692 Antepartum hemorrhage, unspecified, second trimester: Secondary | ICD-10-CM | POA: Diagnosis not present

## 2016-04-09 DIAGNOSIS — B9689 Other specified bacterial agents as the cause of diseases classified elsewhere: Secondary | ICD-10-CM

## 2016-04-09 DIAGNOSIS — N760 Acute vaginitis: Secondary | ICD-10-CM

## 2016-04-09 DIAGNOSIS — Z3A27 27 weeks gestation of pregnancy: Secondary | ICD-10-CM | POA: Insufficient documentation

## 2016-04-09 DIAGNOSIS — F329 Major depressive disorder, single episode, unspecified: Secondary | ICD-10-CM | POA: Diagnosis not present

## 2016-04-09 DIAGNOSIS — Z87891 Personal history of nicotine dependence: Secondary | ICD-10-CM | POA: Diagnosis not present

## 2016-04-09 DIAGNOSIS — O99342 Other mental disorders complicating pregnancy, second trimester: Secondary | ICD-10-CM | POA: Insufficient documentation

## 2016-04-09 DIAGNOSIS — O2342 Unspecified infection of urinary tract in pregnancy, second trimester: Secondary | ICD-10-CM | POA: Diagnosis not present

## 2016-04-09 DIAGNOSIS — F429 Obsessive-compulsive disorder, unspecified: Secondary | ICD-10-CM | POA: Diagnosis not present

## 2016-04-09 LAB — URINE MICROSCOPIC-ADD ON

## 2016-04-09 LAB — URINALYSIS, ROUTINE W REFLEX MICROSCOPIC
Bilirubin Urine: NEGATIVE
Glucose, UA: NEGATIVE mg/dL
Ketones, ur: 15 mg/dL — AB
NITRITE: POSITIVE — AB
PH: 6 (ref 5.0–8.0)
Protein, ur: 30 mg/dL — AB
SPECIFIC GRAVITY, URINE: 1.025 (ref 1.005–1.030)

## 2016-04-09 LAB — WET PREP, GENITAL
Sperm: NONE SEEN
Trich, Wet Prep: NONE SEEN
Yeast Wet Prep HPF POC: NONE SEEN

## 2016-04-09 MED ORDER — NITROFURANTOIN MONOHYD MACRO 100 MG PO CAPS: 100.0000 mg | ORAL_CAPSULE | Freq: Two times a day (BID) | ORAL | Status: DC

## 2016-04-09 MED ORDER — TERCONAZOLE 0.4 % VA CREA: 1.0000 | TOPICAL_CREAM | Freq: Every day | VAGINAL | Status: DC

## 2016-04-09 MED ORDER — CLINDAMYCIN HCL 300 MG PO CAPS: 300.0000 mg | ORAL_CAPSULE | Freq: Two times a day (BID) | ORAL | Status: DC

## 2016-04-09 NOTE — Discharge Instructions (Signed)
Bacterial Vaginosis Bacterial vaginosis is an infection of the vagina. It happens when too many germs (bacteria) grow in the vagina. Having this infection puts you at risk for getting other infections from sex. Treating this infection can help lower your risk for other infections, such as:   Chlamydia.  Gonorrhea.  HIV.  Herpes. HOME CARE  Take your medicine as told by your doctor.  Finish your medicine even if you start to feel better.  Tell your sex partner that you have an infection. They should see their doctor for treatment.  During treatment:  Avoid sex or use condoms correctly.  Do not douche.  Do not drink alcohol unless your doctor tells you it is ok.  Do not breastfeed unless your doctor tells you it is ok. GET HELP IF:  You are not getting better after 3 days of treatment.  You have more grey fluid (discharge) coming from your vagina than before.  You have more pain than before.  You have a fever. MAKE SURE YOU:   Understand these instructions.  Will watch your condition.  Will get help right away if you are not doing well or get worse.   This information is not intended to replace advice given to you by your health care provider. Make sure you discuss any questions you have with your health care provider.   Document Released: 06/22/2008 Document Revised: 10/04/2014 Document Reviewed: 04/25/2013 Elsevier Interactive Patient Education 2016 Elsevier Inc.   Pregnancy and Urinary Tract Infection A urinary tract infection (UTI) is a bacterial infection of the urinary tract. Infection of the urinary tract can include the ureters, kidneys (pyelonephritis), bladder (cystitis), and urethra (urethritis). All pregnant women should be screened for bacteria in the urinary tract. Identifying and treating a UTI will decrease the risk of preterm labor and developing more serious infections in both the mother and baby. CAUSES Bacteria germs cause almost all UTIs.    RISK FACTORS Many factors can increase your chances of getting a UTI during pregnancy. These include:  Having a short urethra.  Poor toilet and hygiene habits.  Sexual intercourse.  Blockage of urine along the urinary tract.  Problems with the pelvic muscles or nerves.  Diabetes.  Obesity.  Bladder problems after having several children.  Previous history of UTI. SIGNS AND SYMPTOMS   Pain, burning, or a stinging feeling when urinating.  Suddenly feeling the need to urinate right away (urgency).  Loss of bladder control (urinary incontinence).  Frequent urination, more than is common with pregnancy.  Lower abdominal or back discomfort.  Cloudy urine.  Blood in the urine (hematuria).  Fever. When the kidneys are infected, the symptoms may be:  Back pain.  Flank pain on the right side more so than the left.  Fever.  Chills.  Nausea.  Vomiting. DIAGNOSIS  A urinary tract infection is usually diagnosed through urine tests. Additional tests and procedures are sometimes done. These may include:  Ultrasound exam of the kidneys, ureters, bladder, and urethra.  Looking in the bladder with a lighted tube (cystoscopy). TREATMENT Typically, UTIs can be treated with antibiotic medicines.  HOME CARE INSTRUCTIONS   Only take over-the-counter or prescription medicines as directed by your health care provider. If you were prescribed antibiotics, take them as directed. Finish them even if you start to feel better.  Drink enough fluids to keep your urine clear or pale yellow.  Do not have sexual intercourse until the infection is gone and your health care provider says it is  okay.  Make sure you are tested for UTIs throughout your pregnancy. These infections often come back. Preventing a UTI in the Future  Practice good toilet habits. Always wipe from front to back. Use the tissue only once.  Do not hold your urine. Empty your bladder as soon as possible when  the urge comes.  Do not douche or use deodorant sprays.  Wash with soap and warm water around the genital area and the anus.  Empty your bladder before and after sexual intercourse.  Wear underwear with a cotton crotch.  Avoid caffeine and carbonated drinks. They can irritate the bladder.  Drink cranberry juice or take cranberry pills. This may decrease the risk of getting a UTI.  Do not drink alcohol.  Keep all your appointments and tests as scheduled. SEEK MEDICAL CARE IF:   Your symptoms get worse.  You are still having fevers 2 or more days after treatment begins.  You have a rash.  You feel that you are having problems with medicines prescribed.  You have abnormal vaginal discharge. SEEK IMMEDIATE MEDICAL CARE IF:   You have back or flank pain.  You have chills.  You have blood in your urine.  You have nausea and vomiting.  You have contractions of your uterus.  You have a gush of fluid from the vagina. MAKE SURE YOU:  Understand these instructions.   Will watch your condition.   Will get help right away if you are not doing well or get worse.    This information is not intended to replace advice given to you by your health care provider. Make sure you discuss any questions you have with your health care provider.   Document Released: 01/08/2011 Document Revised: 07/04/2013 Document Reviewed: 04/12/2013 Elsevier Interactive Patient Education 2016 Elsevier Inc.   Vaginal Bleeding During Pregnancy, Second Trimester A small amount of bleeding (spotting) from the vagina is relatively common in pregnancy. It usually stops on its own. Various things can cause bleeding or spotting in pregnancy. Some bleeding may be related to the pregnancy, and some may not. Sometimes the bleeding is normal and is not a problem. However, bleeding can also be a sign of something serious. Be sure to tell your health care provider about any vaginal bleeding right away. Some  possible causes of vaginal bleeding during the second trimester include:  Infection, inflammation, or growths on the cervix.   The placenta may be partially or completely covering the opening of the cervix inside the uterus (placenta previa).  The placenta may have separated from the uterus (abruption of the placenta).   You may be having early (preterm) labor.   The cervix may not be strong enough to keep a baby inside the uterus (cervical insufficiency).   Tiny cysts may have developed in the uterus instead of pregnancy tissue (molar pregnancy). HOME CARE INSTRUCTIONS  Watch your condition for any changes. The following actions may help to lessen any discomfort you are feeling:  Follow your health care provider's instructions for limiting your activity. If your health care provider orders bed rest, you may need to stay in bed and only get up to use the bathroom. However, your health care provider may allow you to continue light activity.  If needed, make plans for someone to help with your regular activities and responsibilities while you are on bed rest.  Keep track of the number of pads you use each day, how often you change pads, and how soaked (saturated) they are. Write  this down.  Do not use tampons. Do not douche.  Do not have sexual intercourse or orgasms until approved by your health care provider.  If you pass any tissue from your vagina, save the tissue so you can show it to your health care provider.  Only take over-the-counter or prescription medicines as directed by your health care provider.  Do not take aspirin because it can make you bleed.  Do not exercise or perform any strenuous activities or heavy lifting without your health care provider's permission.  Keep all follow-up appointments as directed by your health care provider. SEEK MEDICAL CARE IF:  You have any vaginal bleeding during any part of your pregnancy.  You have cramps or labor  pains.  You have a fever, not controlled by medicine. SEEK IMMEDIATE MEDICAL CARE IF:   You have severe cramps in your back or belly (abdomen).  You have contractions.  You have chills.  You pass large clots or tissue from your vagina.  Your bleeding increases.  You feel light-headed or weak, or you have fainting episodes.  You are leaking fluid or have a gush of fluid from your vagina. MAKE SURE YOU:  Understand these instructions.  Will watch your condition.  Will get help right away if you are not doing well or get worse.   This information is not intended to replace advice given to you by your health care provider. Make sure you discuss any questions you have with your health care provider.   Document Released: 06/23/2005 Document Revised: 09/18/2013 Document Reviewed: 05/21/2013 Elsevier Interactive Patient Education [employer] Elsevier Inc.

## 2016-04-09 NOTE — MAU Note (Signed)
Urine sent to lab 

## 2016-04-09 NOTE — MAU Provider Note (Signed)
Chief Complaint:  Vaginal Bleeding   None    HPI: Shelby Duncan is a 25 y.o. G3P1011 at [redacted]w[redacted]d who presents to maternity admissions reporting vaginal bleeding.  Patient noticed around 4PM, after using the bathroom, she stood up and a few drops of dark red blood came out, denies any clot. Denies abdominal fluid. Also noticed some very small amount of clear fluid that came out this morning when she woke up getting out of bed. Denies contractions. Good fetal movement.   Most recent intercourse was 1 week ago, unprotected.   Patient also describes +urinary urgency, +incomplete emptying, +urinary frequency, +suprapubic tenderness. Patient was treated in 1st trimester for UTI.  Denies fevers, CVA tenderness.   Pregnancy Course:   Early twin pregnancy with vanishing twin. Pregnancy uncomplicated otherwise.  Past Medical History: Past Medical History  Diagnosis Date  . Infection     UTI  . PTSD (post-traumatic stress disorder)   . OCD (obsessive compulsive disorder)   . Depression     Past obstetric history: OB History  Gravida Para Term Preterm AB SAB TAB Ectopic Multiple Living  0 1 1 0 0 0 1    # Outcome Date GA Lbr Len/2nd Weight Sex Delivery Anes PTL Lv  3 Current           2 SAB 07/31/15          1 Term 05/15/09 [redacted]w[redacted]d  7 lb 2.3 oz (3.24 kg) M Vag-Spont None N Y      Past Surgical History: Past Surgical History  Procedure Laterality Date  . No past surgeries       Family History: Family History  Problem Relation Age of Onset  . Cancer Maternal Grandmother     breast  . Cancer Paternal Grandfather     Social History: Social History  Substance Use Topics  . Smoking status: Former Smoker    Types: Cigarettes    Quit date: 08/18/2006  . Smokeless tobacco: Never Used     Comment: age 81  . Alcohol Use: No    Allergies:  Allergies  Allergen Reactions  . Chocolate Swelling  . Metrogel [Metronidazole] Swelling and Other (See Comments)    Skin irritation,  swelling of vulva  . Orange Concentrate [Flavoring Agent] Swelling    Patient is allergic to oranges.    Meds:  Prescriptions prior to admission  Medication Sig Dispense Refill Last Dose  . Prenat-FeAsp-Meth-FA-DHA w/o A (PRENATE PIXIE) 10-0.6-0.4-200 MG CAPS Take 1 capsule by mouth daily before breakfast. 90 capsule 4 04/08/2016 at Unknown time  . Prenatal Vit-Min-FA-Fish Oil ([facility] PRENATAL GUMMY) 0.4-113.5 MG CHEW Chew 1 tablet by mouth daily. Reported on 01/09/2016 (Patient not taking: Reported on 04/09/2016) 30 tablet 2 Not Taking at Unknown time    I have reviewed patient's Past Medical Hx, Surgical Hx, Family Hx, Social Hx, medications and allergies.   ROS:  Review of Systems  Constitutional: Negative for fever, chills, appetite change and fatigue.  Eyes: Negative for photophobia and visual disturbance.  Respiratory: Negative for cough, chest tightness, shortness of breath and wheezing.   Gastrointestinal: Negative for nausea, vomiting, abdominal pain, diarrhea, blood in stool, anal bleeding and rectal pain.  Genitourinary: Positive for urgency, frequency, vaginal bleeding and vaginal discharge. Negative for dysuria.  Musculoskeletal: Negative for myalgias and back pain.  Skin: Negative for rash.  Neurological: Negative for light-headedness and headaches.    Physical Exam  Patient Vitals for the past 24 hrs:  BP  Temp Temp src Pulse Resp Weight  04/09/16 1707 126/56 mmHg 98.2 F (36.8 C) Oral 93 18 221 lb 4 oz (100.358 kg)   Constitutional: Well-developed, well-nourished female in no acute distress.  Cardiovascular: normal rate, rhythm. No murmurs. Respiratory: normal effort, CTAB GI: Abd soft, non-tender, gravid appropriate for gestational age. Pos BS x 4. Mild suprapubic tenderness MS: Extremities nontender, trace edema, normal ROM Neurologic: Alert and oriented x 4.  GU: Neg CVAT. Pelvic/Speculum exam: NEFG, +mucopurulent discharge from cervical os; dark red blood when  touched cervix, friable cervix; +Mild CMT     FHT:  Baseline 140 , moderate variability, accelerations present, no decelerations Contractions: Infrequent, not feeling.   Labs: Results for orders placed or performed during the hospital encounter of 04/09/16 (from the past 24 hour(s))  Urinalysis, Routine w reflex microscopic (not at [facility])     Status: Abnormal   Collection Time: 04/09/16  5:10 PM  Result Value Ref Range   Color, Urine YELLOW YELLOW   APPearance CLOUDY (A) CLEAR   Specific Gravity, Urine 1.025 1.005 - 1.030   pH 6.0 5.0 - 8.0   Glucose, UA NEGATIVE NEGATIVE mg/dL   Hgb urine dipstick MODERATE (A) NEGATIVE   Bilirubin Urine NEGATIVE NEGATIVE   Ketones, ur 15 (A) NEGATIVE mg/dL   Protein, ur 30 (A) NEGATIVE mg/dL   Nitrite POSITIVE (A) NEGATIVE   Leukocytes, UA SMALL (A) NEGATIVE  Urine microscopic-add on     Status: Abnormal   Collection Time: 04/09/16  5:10 PM  Result Value Ref Range   Squamous Epithelial / LPF 6-30 (A) NONE SEEN   WBC, UA 6-30 0 - 5 WBC/hpf   RBC / HPF 0-5 0 - 5 RBC/hpf   Bacteria, UA MANY (A) NONE SEEN   Crystals CA OXALATE CRYSTALS (A) NEGATIVE   Urine-Other AMORPHOUS URATES/PHOSPHATES   Wet prep, genital     Status: Abnormal   Collection Time: 04/09/16  6:00 PM  Result Value Ref Range   Yeast Wet Prep HPF POC NONE SEEN NONE SEEN   Trich, Wet Prep NONE SEEN NONE SEEN   Clue Cells Wet Prep HPF POC PRESENT (A) NONE SEEN   WBC, Wet Prep HPF POC MANY (A) NONE SEEN   Sperm NONE SEEN     Imaging:  No results found.  MAU Course: Speculum exam showed mucopurulent, friable cervix. Mild CMT. Wet mount performed, +BV. No ferning on microscopic exam.    Assessment: 1. UTI (urinary tract infection) during pregnancy, second trimester   2. Bacterial vaginosis   3. Vaginal bleeding in pregnancy, second trimester     Plan: Discharge home in stable condition with prescription medications, reviewed antibiotics given. Discussed with patient to  expect a call if cultures are positive.  Labor precautions and fetal kick counts given. Discussed signs/symptoms of abruption to seek immediate medical care. Discussed if bleeding worsens or becomes heavier to return to MAU.      Medication List    ASK your doctor about these medications        [facility] PRENATAL GUMMY 0.4-113.5 MG Chew  Chew 1 tablet by mouth daily. Reported on 01/09/2016     PRENATE PIXIE 10-0.6-0.4-200 MG Caps  Take 1 capsule by mouth daily before breakfast.        Hiram ComberElizabeth Woodland Mumaw, DO 04/09/2016 5:31 PM  -

## 2016-04-09 NOTE — MAU Note (Addendum)
Noted blood when wiped after using restroom.  Only noted x1.  Urinated only.  No bleeding any other time with preg

## 2016-04-12 LAB — GC/CHLAMYDIA PROBE AMP (~~LOC~~) NOT AT ARMC
Chlamydia: POSITIVE — AB
NEISSERIA GONORRHEA: NEGATIVE

## 2016-04-13 ENCOUNTER — Telehealth (HOSPITAL_COMMUNITY): Payer: Self-pay | Admitting: *Deleted

## 2016-04-13 DIAGNOSIS — A749 Chlamydial infection, unspecified: Secondary | ICD-10-CM

## 2016-04-13 MED ORDER — AZITHROMYCIN 500 MG PO TABS: ORAL_TABLET | ORAL | Status: DC

## 2016-04-13 NOTE — Telephone Encounter (Signed)
Telephone call to patient regarding positive chlamydia culture, patient notified.  Patient has not been treated.  Rx routed to patients pharmacy.  Instructed patient to notify her partner for treatment and to abstain from sex for seven days post treatment.  Report faxed to health department.

## 2016-04-14 ENCOUNTER — Ambulatory Visit (INDEPENDENT_AMBULATORY_CARE_PROVIDER_SITE_OTHER): Payer: Medicaid Other | Admitting: Certified Nurse Midwife

## 2016-04-14 VITALS — BP 123/75 | HR 94 | Temp 98.3°F | Wt 223.0 lb

## 2016-04-14 DIAGNOSIS — O3121X0 Continuing pregnancy after intrauterine death of one fetus or more, first trimester, not applicable or unspecified: Secondary | ICD-10-CM

## 2016-04-14 DIAGNOSIS — O3110X0 Continuing pregnancy after spontaneous abortion of one fetus or more, unspecified trimester, not applicable or unspecified: Secondary | ICD-10-CM

## 2016-04-14 DIAGNOSIS — O0993 Supervision of high risk pregnancy, unspecified, third trimester: Secondary | ICD-10-CM

## 2016-04-14 NOTE — Progress Notes (Signed)
Subjective:    Shelby Duncan is a 25 y.o. female being seen today for her obstetrical visit. She is at 1386w3d gestation. Patient reports backache, no bleeding, no leaking and occasional contractions. Fetal movement: normal.  Discussed with patient at length about need for prenatal visits; patient stated that it was "too hot" to come to the office, and has transportation issues.     Problem List Items Addressed This Visit      Other   Vanishing twin syndrome - Primary   Relevant Orders   US MFM OB FOLLOW UP    Other Visit Diagnoses    Supervision of high risk pregnancy, antepartum, third trimester        Relevant Orders    US MFM OB FOLLOW UP      Patient Active Problem List   Diagnosis Date Noted  . 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 Twin pregnancy w antenatal problem 12/11/2015   Objective:    BP 123/75 mmHg  Pulse 94  Temp(Src) 98.3 F (36.8 C)  Wt 223 lb (101.152 kg)  LMP 09/28/2015 FHT:  153 BPM  Uterine Size: size equals dates  Presentation: cephalic    NST: + accels, no decels, moderate variability, Cat. 1 tracing. No contractions on toco.   Assessment:    Pregnancy @ 886w3d weeks    Reactive NST  Limited prenatal care  Vanishing twin syndrome  Plan:   Needs 2 hour OGTT  RX: abdominal maternity support belt   labs reviewed, problem list updated Consent signed. GBS planning TDAP offered  Rhogam given for RH negative Pediatrician: discussed. Infant feeding: plans to breastfeed. Maternity leave: N/A. Cigarette smoking: never smoked. Orders Placed This Encounter  Procedures  . US MFM OB FOLLOW UP    Standing Status: Future     Number of Occurrences:      Standing Expiration Date: 06/15/2017    Order Specific Question:  Reason for Exam (SYMPTOM  OR DIAGNOSIS REQUIRED)    Answer:  growth, limited prenatal care, vanishing twin syndrome    Order Specific Question:  Preferred imaging location?    Answer:  [facility]   No orders of the defined  types were placed in this encounter.   Follow up in 2 Weeks.

## 2016-04-15 ENCOUNTER — Other Ambulatory Visit: Payer: Medicaid Other

## 2016-04-16 ENCOUNTER — Other Ambulatory Visit: Payer: Medicaid Other

## 2016-04-16 DIAGNOSIS — Z3493 Encounter for supervision of normal pregnancy, unspecified, third trimester: Secondary | ICD-10-CM

## 2016-04-17 LAB — RPR: RPR Ser Ql: NONREACTIVE

## 2016-04-17 LAB — CBC
Hematocrit: 30.5 % — ABNORMAL LOW (ref 34.0–46.6)
Hemoglobin: 10.2 g/dL — ABNORMAL LOW (ref 11.1–15.9)
MCH: 27.8 pg (ref 26.6–33.0)
MCHC: 33.4 g/dL (ref 31.5–35.7)
MCV: 83 fL (ref 79–97)
PLATELETS: 354 10*3/uL (ref 150–379)
RBC: 3.67 x10E6/uL — ABNORMAL LOW (ref 3.77–5.28)
RDW: 13.4 % (ref 12.3–15.4)
WBC: 13 10*3/uL — AB (ref 3.4–10.8)

## 2016-04-17 LAB — GLUCOSE TOLERANCE, 2 HOURS W/ 1HR
GLUCOSE, 1 HOUR: 87 mg/dL (ref 65–179)
Glucose, 2 hour: 81 mg/dL (ref 65–152)
Glucose, Fasting: 79 mg/dL (ref 65–91)

## 2016-04-17 LAB — HIV ANTIBODY (ROUTINE TESTING W REFLEX): HIV SCREEN 4TH GENERATION: NONREACTIVE

## 2016-04-20 ENCOUNTER — Other Ambulatory Visit: Payer: Self-pay | Admitting: Certified Nurse Midwife

## 2016-04-20 ENCOUNTER — Ambulatory Visit (HOSPITAL_COMMUNITY)
Admission: RE | Admit: 2016-04-20 | Discharge: 2016-04-20 | Disposition: A | Discharge status: Home / Self Care | Payer: Medicaid Other | Source: Ambulatory Visit | Attending: Certified Nurse Midwife | Admitting: Certified Nurse Midwife

## 2016-04-20 DIAGNOSIS — Z3A29 29 weeks gestation of pregnancy: Secondary | ICD-10-CM | POA: Diagnosis not present

## 2016-04-20 DIAGNOSIS — O3113X0 Continuing pregnancy after spontaneous abortion of one fetus or more, third trimester, not applicable or unspecified: Secondary | ICD-10-CM | POA: Diagnosis not present

## 2016-04-20 DIAGNOSIS — O0993 Supervision of high risk pregnancy, unspecified, third trimester: Secondary | ICD-10-CM

## 2016-04-20 DIAGNOSIS — O99019 Anemia complicating pregnancy, unspecified trimester: Secondary | ICD-10-CM

## 2016-04-20 DIAGNOSIS — O99343 Other mental disorders complicating pregnancy, third trimester: Secondary | ICD-10-CM

## 2016-04-20 DIAGNOSIS — IMO0002 Reserved for concepts with insufficient information to code with codable children: Secondary | ICD-10-CM

## 2016-04-20 DIAGNOSIS — O30003 Twin pregnancy, unspecified number of placenta and unspecified number of amniotic sacs, third trimester: Secondary | ICD-10-CM | POA: Diagnosis not present

## 2016-04-20 DIAGNOSIS — O0933 Supervision of pregnancy with insufficient antenatal care, third trimester: Secondary | ICD-10-CM | POA: Diagnosis not present

## 2016-04-20 DIAGNOSIS — O3110X0 Continuing pregnancy after spontaneous abortion of one fetus or more, unspecified trimester, not applicable or unspecified: Secondary | ICD-10-CM

## 2016-04-20 MED ORDER — IRON POLYSACCH CMPLX-B12-FA 150-0.025-1 MG PO CAPS: 1.0000 | ORAL_CAPSULE | Freq: Every day | ORAL | 4 refills | Status: DC

## 2016-04-28 ENCOUNTER — Encounter: Payer: Self-pay | Admitting: Obstetrics

## 2016-04-28 ENCOUNTER — Ambulatory Visit (INDEPENDENT_AMBULATORY_CARE_PROVIDER_SITE_OTHER): Payer: Medicaid Other | Admitting: Obstetrics

## 2016-04-28 VITALS — BP 139/85 | HR 96 | Temp 98.2°F | Wt 224.2 lb

## 2016-04-28 DIAGNOSIS — Z3493 Encounter for supervision of normal pregnancy, unspecified, third trimester: Secondary | ICD-10-CM

## 2016-04-28 DIAGNOSIS — K219 Gastro-esophageal reflux disease without esophagitis: Secondary | ICD-10-CM

## 2016-04-28 LAB — POCT URINALYSIS DIPSTICK
BILIRUBIN UA: NEGATIVE
GLUCOSE UA: NEGATIVE
Ketones, UA: NEGATIVE
NITRITE UA: NEGATIVE
PH UA: 6
Protein, UA: NEGATIVE
RBC UA: NEGATIVE
SPEC GRAV UA: 1.02
Urobilinogen, UA: 4

## 2016-04-28 MED ORDER — OMEPRAZOLE 20 MG PO CPDR: 20.0000 mg | DELAYED_RELEASE_CAPSULE | Freq: Two times a day (BID) | ORAL | 5 refills | Status: DC

## 2016-04-28 NOTE — Progress Notes (Signed)
Subjective:    Shelby Duncan is a 25 y.o. female being seen today for her obstetrical visit. She is at [redacted]w[redacted]d gestation. Patient reports occasional contractions. Fetal movement: normal.  Problem List Items Addressed This Visit    None    Visit Diagnoses    Prenatal care, third trimester    -  Primary   Relevant Orders   POCT urinalysis dipstick (Completed)   Fetal nonstress test   GERD without esophagitis       Relevant Medications   omeprazole (PRILOSEC) 20 MG capsule     Patient Active Problem List   Diagnosis Date Noted  . 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 Twin pregnancy w antenatal problem 12/11/2015   Objective:    BP 139/85   Pulse 96   Temp 98.2 F (36.8 C)   Wt 224 lb 3.2 oz (101.7 kg)   LMP 09/28/2015   BMI 33.11 kg/m  FHT:  150 BPM  Uterine Size: size equals dates  Presentation: unsure     Assessment:    Pregnancy @ [redacted]w[redacted]d weeks   Plan:     labs reviewed, problem list updated Consent signed. GBS sent TDAP offered  Rhogam given for RH negative Pediatrician: discussed. Infant feeding: plans to breastfeed. Maternity leave: discussed. Cigarette smoking: never smoked. Orders Placed This Encounter  Procedures  . POCT urinalysis dipstick  . Fetal nonstress test    Standing Status:   Future    Standing Expiration Date:   04/28/2017   Meds ordered this encounter  Medications  . omeprazole (PRILOSEC) 20 MG capsule    Sig: Take 1 capsule (20 mg total) by mouth 2 (two) times daily before a meal.    Dispense:  60 capsule    Refill:  5   Follow up in 2 Weeks.

## 2016-04-28 NOTE — Progress Notes (Signed)
Patient states she has been having daily contractions that are rather painful- she is having them now.

## 2016-05-06 ENCOUNTER — Telehealth: Payer: Self-pay | Admitting: *Deleted

## 2016-05-06 NOTE — Telephone Encounter (Signed)
Patient states has had back pain all night that changing position has not helped.Asked patient to go to MAU to make sure that she is not contracting.

## 2016-05-12 ENCOUNTER — Ambulatory Visit (INDEPENDENT_AMBULATORY_CARE_PROVIDER_SITE_OTHER): Payer: Medicaid Other | Admitting: Obstetrics

## 2016-05-12 ENCOUNTER — Encounter: Payer: Self-pay | Admitting: Obstetrics

## 2016-05-12 VITALS — BP 119/69 | HR 98 | Temp 98.5°F | Wt 226.8 lb

## 2016-05-12 DIAGNOSIS — Z3493 Encounter for supervision of normal pregnancy, unspecified, third trimester: Secondary | ICD-10-CM

## 2016-05-12 NOTE — Progress Notes (Signed)
Subjective:    Shelby Duncan is a 25 y.o. female being seen today for her obstetrical visit. She is at 6874w3d gestation. Patient reports no complaints. Fetal movement: normal.  Problem List Items Addressed This Visit    None    Visit Diagnoses   None.    Patient Active Problem List   Diagnosis Date Noted  . 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 Twin pregnancy w antenatal problem 12/11/2015   Objective:    BP 119/69   Pulse 98   Temp 98.5 F (36.9 C)   Wt 226 lb 12.8 oz (102.9 kg)   LMP 09/28/2015   BMI 33.49 kg/m  FHT:  150 BPM  Uterine Size: size equals dates  Presentation: unsure     Assessment:    Pregnancy @ 5774w3d weeks   Plan:     labs reviewed, problem list updated Consent signed. GBS sent TDAP offered  Rhogam given for RH negative Pediatrician: discussed. Infant feeding: plans to breastfeed. Maternity leave: discussed. Cigarette smoking: never smoked. No orders of the defined types were placed in this encounter.  No orders of the defined types were placed in this encounter.  Follow up in 2 Weeks.

## 2016-05-12 NOTE — Progress Notes (Signed)
Pt c/o low back pain and vaginal pressure.

## 2016-05-21 ENCOUNTER — Ambulatory Visit (HOSPITAL_COMMUNITY): Payer: Medicaid Other

## 2016-05-23 ENCOUNTER — Inpatient Hospital Stay (HOSPITAL_COMMUNITY)
Admission: AD | Admit: 2016-05-23 | Discharge: 2016-05-23 | Disposition: A | Discharge status: Home / Self Care | Payer: Medicaid Other | Source: Ambulatory Visit | Attending: Obstetrics and Gynecology | Admitting: Obstetrics and Gynecology

## 2016-05-23 ENCOUNTER — Encounter (HOSPITAL_COMMUNITY): Payer: Self-pay | Admitting: Certified Nurse Midwife

## 2016-05-23 DIAGNOSIS — Z87891 Personal history of nicotine dependence: Secondary | ICD-10-CM | POA: Insufficient documentation

## 2016-05-23 DIAGNOSIS — O99013 Anemia complicating pregnancy, third trimester: Secondary | ICD-10-CM | POA: Insufficient documentation

## 2016-05-23 DIAGNOSIS — Z3A34 34 weeks gestation of pregnancy: Secondary | ICD-10-CM | POA: Diagnosis not present

## 2016-05-23 DIAGNOSIS — R631 Polydipsia: Secondary | ICD-10-CM | POA: Diagnosis present

## 2016-05-23 DIAGNOSIS — O9989 Other specified diseases and conditions complicating pregnancy, childbirth and the puerperium: Secondary | ICD-10-CM | POA: Insufficient documentation

## 2016-05-23 DIAGNOSIS — F4310 Post-traumatic stress disorder, unspecified: Secondary | ICD-10-CM | POA: Diagnosis not present

## 2016-05-23 DIAGNOSIS — N390 Urinary tract infection, site not specified: Secondary | ICD-10-CM | POA: Insufficient documentation

## 2016-05-23 DIAGNOSIS — F429 Obsessive-compulsive disorder, unspecified: Secondary | ICD-10-CM | POA: Diagnosis not present

## 2016-05-23 DIAGNOSIS — O2343 Unspecified infection of urinary tract in pregnancy, third trimester: Secondary | ICD-10-CM | POA: Insufficient documentation

## 2016-05-23 DIAGNOSIS — O99343 Other mental disorders complicating pregnancy, third trimester: Secondary | ICD-10-CM | POA: Diagnosis not present

## 2016-05-23 LAB — CBC
HEMATOCRIT: 26.4 % — AB (ref 36.0–46.0)
HEMOGLOBIN: 8.7 g/dL — AB (ref 12.0–15.0)
MCH: 26.5 pg (ref 26.0–34.0)
MCHC: 33 g/dL (ref 30.0–36.0)
MCV: 80.5 fL (ref 78.0–100.0)
Platelets: 289 10*3/uL (ref 150–400)
RBC: 3.28 MIL/uL — ABNORMAL LOW (ref 3.87–5.11)
RDW: 13.5 % (ref 11.5–15.5)
WBC: 10.3 10*3/uL (ref 4.0–10.5)

## 2016-05-23 LAB — COMPREHENSIVE METABOLIC PANEL
ALBUMIN: 2.8 g/dL — AB (ref 3.5–5.0)
ALK PHOS: 74 U/L (ref 38–126)
ALT: 9 U/L — ABNORMAL LOW (ref 14–54)
ANION GAP: 5 (ref 5–15)
AST: 17 U/L (ref 15–41)
BILIRUBIN TOTAL: 0.5 mg/dL (ref 0.3–1.2)
BUN: 7 mg/dL (ref 6–20)
CALCIUM: 8.8 mg/dL — AB (ref 8.9–10.3)
CO2: 23 mmol/L (ref 22–32)
Chloride: 106 mmol/L (ref 101–111)
Creatinine, Ser: 0.52 mg/dL (ref 0.44–1.00)
GLUCOSE: 78 mg/dL (ref 65–99)
Potassium: 3.6 mmol/L (ref 3.5–5.1)
Sodium: 134 mmol/L — ABNORMAL LOW (ref 135–145)
TOTAL PROTEIN: 6.7 g/dL (ref 6.5–8.1)

## 2016-05-23 LAB — GLUCOSE, CAPILLARY: Glucose-Capillary: 101 mg/dL — ABNORMAL HIGH (ref 65–99)

## 2016-05-23 LAB — URINALYSIS, ROUTINE W REFLEX MICROSCOPIC
Bilirubin Urine: NEGATIVE
Glucose, UA: NEGATIVE mg/dL
Hgb urine dipstick: NEGATIVE
KETONES UR: NEGATIVE mg/dL
NITRITE: NEGATIVE
PROTEIN: NEGATIVE mg/dL
Specific Gravity, Urine: 1.02 (ref 1.005–1.030)
pH: 6 (ref 5.0–8.0)

## 2016-05-23 LAB — URINE MICROSCOPIC-ADD ON

## 2016-05-23 MED ORDER — FERROUS SULFATE 325 (65 FE) MG PO TABS: 325.0000 mg | ORAL_TABLET | Freq: Two times a day (BID) | ORAL | 0 refills | Status: DC

## 2016-05-23 NOTE — MAU Provider Note (Signed)
History     CSN: [id number]  Arrival date and time: 05/23/16 1105   First Provider Initiated Contact with Patient 05/23/16 1159      Chief Complaint  Patient presents with  . Polydipsia   HPI Shelby VINCIGUERRA is a 25 y.o. G3P1011 at [redacted]w[redacted]d who presents with increased thirst. Symptoms began yesterday. Reports drinking 2 liters of water yesterday and 1 liter so far today and still feels thirsty. Aside from water, has had 1 cup of iced tea. Denies hx of diabetes. Denies urinary complaints, fever, abdominal pain, vaginal bleeding, LOF, n/v/d. Positive fetal movement. Has increased urinary frequency recently that she thought was normal for pregnancy; voids small amounts several times a day; worse when walking up and down her steps. No recent change in medications.   OB History    Gravida Para Term Preterm AB Living   3 1 1  0 1 1   SAB TAB Ectopic Multiple Live Births   1 0 0 0 1      Past Medical History:  Diagnosis Date  . Depression   . Infection    UTI  . OCD (obsessive compulsive disorder)   . PTSD (post-traumatic stress disorder)     Past Surgical History:  Procedure Laterality Date  . NO PAST SURGERIES      Family History  Problem Relation Age of Onset  . Cancer Maternal Grandmother     breast  . Cancer Paternal Grandfather     Social History  Substance Use Topics  . Smoking status: Former Smoker    Types: Cigarettes    Quit date: 08/18/2006  . Smokeless tobacco: Never Used     Comment: age 15  . Alcohol use No    Allergies:  Allergies  Allergen Reactions  . Chocolate Anaphylaxis and Rash  . 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    . Metrogel [Metronidazole] Swelling and Other (See Comments)    Reaction:  Swelling of vulva     Prescriptions Prior to Admission  Medication Sig Dispense Refill Last Dose  . Prenat-FeAsp-Meth-FA-DHA w/o A (PRENATE PIXIE) 10-0.6-0.4-200 MG CAPS Take 1 capsule by mouth  daily before breakfast. 90 capsule 4 Past Week at Unknown time  . clindamycin (CLEOCIN) 300 MG capsule Take 1 capsule (300 mg total) by mouth 2 (two) times daily. (Patient not taking: Reported on 05/23/2016) 14 capsule 0   . Iron Polysacch Cmplx-B12-FA 150-0.025-1 MG CAPS Take 1 tablet by mouth daily. (Patient not taking: Reported on 05/23/2016) 30 each 4   . omeprazole (PRILOSEC) 20 MG capsule Take 1 capsule (20 mg total) by mouth 2 (two) times daily before a meal. (Patient not taking: Reported on 05/23/2016) 60 capsule 5     Review of Systems  Constitutional: Negative.   Respiratory: Negative.   Cardiovascular: Negative.   Gastrointestinal: Positive for constipation. Negative for abdominal pain, diarrhea, nausea and vomiting.  Genitourinary: Positive for frequency. Negative for dysuria, hematuria and urgency.  Endo/Heme/Allergies: Positive for polydipsia.   Physical Exam   Blood pressure 127/70, pulse 98, temperature 97.8 F (36.6 C), temperature source Oral, resp. rate 20, last menstrual period 09/28/2015, unknown if currently breastfeeding.  Physical Exam  Nursing note and vitals reviewed. Constitutional: She is oriented to person, place, and time. She appears well-developed and well-nourished. No distress.  HENT:  Head: Normocephalic and atraumatic.  Mouth/Throat: Mucous membranes are pale.  Eyes: Conjunctivae are normal. Right eye exhibits no  discharge. Left eye exhibits no discharge. No scleral icterus.  Neck: Normal range of motion.  Cardiovascular: Normal rate, regular rhythm and normal heart sounds.   No murmur heard. Respiratory: Effort normal and breath sounds normal. No respiratory distress. She has no wheezes.  GI: Soft. There is no tenderness.  Neurological: She is alert and oriented to person, place, and time.  Skin: Skin is warm and dry. She is not diaphoretic.  Psychiatric: She has a normal mood and affect. Her behavior is normal. Judgment and thought content normal.    Fetal Tracing:  Baseline: 145 Variability: moderate Accelerations: 15x15 Decelerations: none  Toco: none   MAU Course  Procedures Results for orders placed or performed during the hospital encounter of 05/23/16 (from the past 24 hour(s))  Glucose, capillary     Status: Abnormal   Collection Time: 05/23/16 11:49 AM  Result Value Ref Range   Glucose-Capillary 101 (H) 65 - 99 mg/dL  CBC     Status: Abnormal   Collection Time: 05/23/16 12:23 PM  Result Value Ref Range   WBC 10.3 4.0 - 10.5 K/uL   RBC 3.28 (L) 3.87 - 5.11 MIL/uL   Hemoglobin 8.7 (L) 12.0 - 15.0 g/dL   HCT 16.1 (L) 09.6 - 04.5 %   MCV 80.5 78.0 - 100.0 fL   MCH 26.5 26.0 - 34.0 pg   MCHC 33.0 30.0 - 36.0 g/dL   RDW 40.9 81.1 - 91.4 %   Platelets 289 150 - 400 K/uL  Comprehensive metabolic panel     Status: Abnormal   Collection Time: 05/23/16 12:23 PM  Result Value Ref Range   Sodium 134 (L) 135 - 145 mmol/L   Potassium 3.6 3.5 - 5.1 mmol/L   Chloride 106 101 - 111 mmol/L   CO2 23 22 - 32 mmol/L   Glucose, Bld 78 65 - 99 mg/dL   BUN 7 6 - 20 mg/dL   Creatinine, Ser 7.82 0.44 - 1.00 mg/dL   Calcium 8.8 (L) 8.9 - 10.3 mg/dL   Total Protein 6.7 6.5 - 8.1 g/dL   Albumin 2.8 (L) 3.5 - 5.0 g/dL   AST 17 15 - 41 U/L   ALT 9 (L) 14 - 54 U/L   Alkaline Phosphatase 74 38 - 126 U/L   Total Bilirubin 0.5 0.3 - 1.2 mg/dL   GFR calc non Af Amer >60 >60 mL/min   GFR calc Af Amer >60 >60 mL/min   Anion gap 5 5 - 15  Urinalysis, Routine w reflex microscopic (not at [facility])     Status: Abnormal   Collection Time: 05/23/16  1:05 PM  Result Value Ref Range   Color, Urine YELLOW YELLOW   APPearance HAZY (A) CLEAR   Specific Gravity, Urine 1.020 1.005 - 1.030   pH 6.0 5.0 - 8.0   Glucose, UA NEGATIVE NEGATIVE mg/dL   Hgb urine dipstick NEGATIVE NEGATIVE   Bilirubin Urine NEGATIVE NEGATIVE   Ketones, ur NEGATIVE NEGATIVE mg/dL   Protein, ur NEGATIVE NEGATIVE mg/dL   Nitrite NEGATIVE NEGATIVE   Leukocytes, UA SMALL  (A) NEGATIVE  Urine microscopic-add on     Status: Abnormal   Collection Time: 05/23/16  1:05 PM  Result Value Ref Range   Squamous Epithelial / LPF 6-30 (A) NONE SEEN   WBC, UA 6-30 0 - 5 WBC/hpf   RBC / HPF 0-5 0 - 5 RBC/hpf   Bacteria, UA MANY (A) NONE SEEN    MDM Reactive fetal tracing CBG 101 CBC,  CMP U/a normal NAD, VSS  Assessment and Plan  A: 1. Anemia in pregnancy, third trimester   2. Polydipsia    P: Discharge home Rx ferrous sulfate Discussed reasons to return  Keep f/u with OB this week  Judeth Hornrin Akita Maxim 05/23/2016, 11:59 AM

## 2016-05-23 NOTE — MAU Note (Signed)
Pt states she came to the MAU today because she is thirsty. Pt states she is drinking 2 liters of water per day but she is still thirsty. Pt on her cell phone with a friend during questioning. Pt denies LOF, Vaginal bleeding, or ctxs. Fetus active.

## 2016-05-23 NOTE — Discharge Instructions (Signed)
Pregnancy and Anemia °Anemia is a condition in which the concentration of red blood cells or hemoglobin in the blood is below normal. Hemoglobin is a substance in red blood cells that carries oxygen to the tissues of the body. Anemia results in not enough oxygen reaching these tissues.  °Anemia during pregnancy is common because the fetus uses more iron and folic acid as it is developing. Your body may not produce enough red blood cells because of this. Also, during pregnancy, the liquid part of the blood (plasma) increases by about 50%, and the red blood cells increase by only 25%. This lowers the concentration of the red blood cells and creates a natural anemia-like situation.  °CAUSES  °The most common cause of anemia during pregnancy is not having enough iron in the body to make red blood cells (iron deficiency anemia). Other causes may include: °· Folic acid deficiency. °· Vitamin B12 deficiency. °· Certain prescription or over-the-counter medicines. °· Certain medical conditions or infections that destroy red blood cells. °· A low platelet count and bleeding caused by antibodies that go through the placenta to the fetus from the mother's blood. °SIGNS AND SYMPTOMS  °Mild anemia may not be noticeable. If it becomes severe, symptoms may include: °· Tiredness. °· Shortness of breath, especially with exercise. °· Weakness. °· Fainting. °· Pale looking skin. °· Headaches. °· Feeling a fast or irregular heartbeat (palpitations). °DIAGNOSIS  °The type of anemia is usually diagnosed from your family and medical history and blood tests. °TREATMENT  °Treatment of anemia during pregnancy depends on the cause of the anemia. Treatment can include: °· Supplements of iron, vitamin B12, or folic acid. °· A blood transfusion. This may be needed if blood loss is severe. °· Hospitalization. This may be needed if there is significant continual blood loss. °· Dietary changes. °HOME CARE INSTRUCTIONS  °· Follow your dietitian's or  health care provider's dietary recommendations. °· Increase your vitamin C intake. This will help the stomach absorb more iron. °· Eat a diet rich in iron. This would include foods such as: °¨ Liver. °¨ Beef. °¨ Whole grain bread. °¨ Eggs. °¨ Dried fruit. °· Take iron and vitamins as directed by your health care provider. °· Eat green leafy vegetables. These are a good source of folic acid. °SEEK MEDICAL CARE IF:  °· You have frequent or lasting headaches. °· You are looking pale. °· You are bruising easily. °SEEK IMMEDIATE MEDICAL CARE IF:  °· You have extreme weakness, shortness of breath, or chest pain. °· You become dizzy or have trouble concentrating. °· You have heavy vaginal bleeding. °· You develop a rash. °· You have bloody or black, tarry stools. °· You faint. °· You vomit up blood. °· You vomit repeatedly. °· You have abdominal pain. °· You have a fever or persistent symptoms for more than 2-3 days. °· You have a fever and your symptoms suddenly get worse. °· You are dehydrated. °MAKE SURE YOU:  °· Understand these instructions. °· Will watch your condition. °· Will get help right away if you are not doing well or get worse. °  °This information is not intended to replace advice given to you by your health care provider. Make sure you discuss any questions you have with your health care provider. °  °Document Released: 09/10/2000 Document Revised: 07/04/2013 Document Reviewed: 04/25/2013 °Elsevier Interactive Patient Education ©2016 [employer] ° °

## 2016-05-26 ENCOUNTER — Other Ambulatory Visit: Payer: Self-pay | Admitting: Certified Nurse Midwife

## 2016-05-26 ENCOUNTER — Encounter (HOSPITAL_COMMUNITY): Payer: Self-pay

## 2016-05-26 ENCOUNTER — Ambulatory Visit (INDEPENDENT_AMBULATORY_CARE_PROVIDER_SITE_OTHER): Payer: Medicaid Other | Admitting: Obstetrics

## 2016-05-26 ENCOUNTER — Ambulatory Visit (HOSPITAL_COMMUNITY)
Admission: RE | Admit: 2016-05-26 | Discharge: 2016-05-26 | Disposition: A | Discharge status: Home / Self Care | Payer: Medicaid Other | Source: Ambulatory Visit | Attending: Certified Nurse Midwife | Admitting: Certified Nurse Midwife

## 2016-05-26 ENCOUNTER — Encounter: Payer: Self-pay | Admitting: Obstetrics

## 2016-05-26 VITALS — BP 114/72 | HR 92 | Temp 98.2°F | Wt 225.4 lb

## 2016-05-26 VITALS — BP 118/67 | HR 84 | Wt 224.6 lb

## 2016-05-26 DIAGNOSIS — Z3493 Encounter for supervision of normal pregnancy, unspecified, third trimester: Secondary | ICD-10-CM

## 2016-05-26 DIAGNOSIS — O365990 Maternal care for other known or suspected poor fetal growth, unspecified trimester, not applicable or unspecified: Secondary | ICD-10-CM

## 2016-05-26 DIAGNOSIS — O3113X0 Continuing pregnancy after spontaneous abortion of one fetus or more, third trimester, not applicable or unspecified: Secondary | ICD-10-CM

## 2016-05-26 DIAGNOSIS — O0993 Supervision of high risk pregnancy, unspecified, third trimester: Secondary | ICD-10-CM

## 2016-05-26 DIAGNOSIS — Z3A34 34 weeks gestation of pregnancy: Secondary | ICD-10-CM

## 2016-05-26 DIAGNOSIS — O30003 Twin pregnancy, unspecified number of placenta and unspecified number of amniotic sacs, third trimester: Secondary | ICD-10-CM

## 2016-05-26 NOTE — Progress Notes (Signed)
Patient reports she is having pressure and a few contractions.

## 2016-05-26 NOTE — Progress Notes (Signed)
Subjective:    Shelby Duncan is a 25 y.o. female being seen today for her obstetrical visit. She is at 4874w3d gestation. Patient reports backache and occasional contractions. Fetal movement: normal.  Problem List Items Addressed This Visit    None    Visit Diagnoses    Prenatal care, third trimester    -  Primary   Relevant Orders   POCT urinalysis dipstick     Patient Active Problem List   Diagnosis Date Noted  . 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 Twin pregnancy w antenatal problem 12/11/2015   Objective:    BP 114/72   Pulse 92   Temp 98.2 F (36.8 C)   Wt 225 lb 6.4 oz (102.2 kg)   LMP 09/28/2015   BMI 33.29 kg/m  FHT:  150 BPM  Uterine Size: size equals dates  Presentation: unsure     Assessment:    Pregnancy @ 7174w3d weeks   Plan:     labs reviewed, problem list updated Consent signed. GBS sent TDAP offered  Rhogam given for RH negative Pediatrician: discussed. Infant feeding: plans to breastfeed. Maternity leave: discussed. Cigarette smoking: former smoker. Orders Placed This Encounter  Procedures  . POCT urinalysis dipstick   No orders of the defined types were placed in this encounter.  Follow up in 1 Week.   Patient ID: Shelby Duncan, female   DOB: 1991-06-15, 25 y.o.   MRN: [id number]

## 2016-06-02 ENCOUNTER — Ambulatory Visit (INDEPENDENT_AMBULATORY_CARE_PROVIDER_SITE_OTHER): Payer: Medicaid Other | Admitting: Obstetrics and Gynecology

## 2016-06-02 VITALS — BP 111/71 | HR 89 | Temp 97.6°F | Wt 225.0 lb

## 2016-06-02 DIAGNOSIS — O3121X0 Continuing pregnancy after intrauterine death of one fetus or more, first trimester, not applicable or unspecified: Secondary | ICD-10-CM

## 2016-06-02 DIAGNOSIS — O30009 Twin pregnancy, unspecified number of placenta and unspecified number of amniotic sacs, unspecified trimester: Secondary | ICD-10-CM

## 2016-06-02 DIAGNOSIS — O3110X0 Continuing pregnancy after spontaneous abortion of one fetus or more, unspecified trimester, not applicable or unspecified: Secondary | ICD-10-CM

## 2016-06-02 NOTE — Progress Notes (Signed)
Pt. Denies questions or concerns at this time. 

## 2016-06-02 NOTE — Progress Notes (Signed)
Subjective:  Shelby Duncan is a 25 y.o. G3P1011 at 6813w3d being seen today for ongoing prenatal care.  She is currently monitored for the following issues for this high-risk pregnancy and has Twin pregnancy w antenatal problem and Vanishing twin syndrome on her problem list.  Patient reports backache.  Contractions: Irregular. Vag. Bleeding: None.  Movement: Present. Denies leaking of fluid.   The following portions of the patient's history were reviewed and updated as appropriate: allergies, current medications, past family history, past medical history, past social history, past surgical history and problem list. Problem list updated.  Objective:   Vitals:   06/02/16 0945  BP: 111/71  Pulse: 89  Temp: 97.6 F (36.4 C)  Weight: 225 lb (102.1 kg)    Fetal Status:     Movement: Present     General:  Alert, oriented and cooperative. Patient is in no acute distress.  Skin: Skin is warm and dry. No rash noted.   Cardiovascular: Normal heart rate noted  Respiratory: Normal respiratory effort, no problems with respiration noted  Abdomen: Soft, gravid, appropriate for gestational age. Pain/Pressure: Present     Pelvic:  Cervical exam deferred        Extremities: Normal range of motion.  Edema: None  Mental Status: Normal mood and affect. Normal behavior. Normal judgment and thought content.   Urinalysis:      Assessment and Plan:  Pregnancy: G3P1011 at 4913w3d  1. Vanishing twin syndrome   2. Twin pregnancy w antenatal problem GBS with next office visit  Preterm labor symptoms and general obstetric precautions including but not limited to vaginal bleeding, contractions, leaking of fluid and fetal movement were reviewed in detail with the patient. Please refer to After Visit Summary for other counseling recommendations.  Return in about 1 week (around 06/09/2016) for OB visit.   Hermina StaggersMichael L Ceaser Ebeling, MD

## 2016-06-11 ENCOUNTER — Encounter: Payer: Self-pay | Admitting: *Deleted

## 2016-06-11 DIAGNOSIS — Z3480 Encounter for supervision of other normal pregnancy, unspecified trimester: Secondary | ICD-10-CM

## 2016-06-11 DIAGNOSIS — Z3490 Encounter for supervision of normal pregnancy, unspecified, unspecified trimester: Secondary | ICD-10-CM | POA: Insufficient documentation

## 2016-06-11 NOTE — Progress Notes (Signed)
Created OB Box

## 2016-06-14 ENCOUNTER — Ambulatory Visit (INDEPENDENT_AMBULATORY_CARE_PROVIDER_SITE_OTHER): Payer: Medicaid Other | Admitting: Family Medicine

## 2016-06-14 DIAGNOSIS — Z3480 Encounter for supervision of other normal pregnancy, unspecified trimester: Secondary | ICD-10-CM

## 2016-06-14 NOTE — Progress Notes (Signed)
   PRENATAL VISIT NOTE  Subjective:  Shelby Duncan is a 25 y.o. G3P1011 at 9079w1d being seen today for ongoing prenatal care.  She is currently monitored for the following issues for this low-risk pregnancy and has Twin pregnancy w antenatal problem; Vanishing twin syndrome; Supervision of normal pregnancy, antepartum; Depression; and Schizophrenia (HCC) on her problem list.  Patient reports no complaints.  Contractions: Not present. Vag. Bleeding: None.  Movement: Present. Denies leaking of fluid.   The following portions of the patient's history were reviewed and updated as appropriate: allergies, current medications, past family history, past medical history, past social history, past surgical history and problem list. Problem list updated.  Objective:   Vitals:   06/14/16 1135  BP: 117/73  Pulse: 88  Temp: 98.5 F (36.9 C)  Weight: 220 lb 6.4 oz (100 kg)    Fetal Status: Fetal Heart Rate (bpm): 146 Fundal Height: 34 cm Movement: Present     General:  Alert, oriented and cooperative. Patient is in no acute distress.  Skin: Skin is warm and dry. No rash noted.   Cardiovascular: Normal heart rate noted  Respiratory: Normal respiratory effort, no problems with respiration noted  Abdomen: Soft, gravid, appropriate for gestational age. Pain/Pressure: Absent     Pelvic:  Cervical exam performed Dilation: 1 Effacement (%): 50 Station: -1  Extremities: Normal range of motion.  Edema: None  Mental Status: Normal mood and affect. Normal behavior. Normal judgment and thought content.   Urinalysis: Urine Protein: Trace Urine Glucose: Negative  Assessment and Plan:  Pregnancy: G3P1011 at 5179w1d  1. Supervision of normal pregnancy, antepartum, unspecified trimester Continue routine prenatal care.  - Strep Gp B NAA - GC/Chlamydia Probe Amp  Preterm labor symptoms and general obstetric precautions including but not limited to vaginal bleeding, contractions, leaking of fluid and fetal  movement were reviewed in detail with the patient. Please refer to After Visit Summary for other counseling recommendations.  Return in 1 week (on 06/21/2016).  Reva Boresanya S Kate Larock, MD

## 2016-06-14 NOTE — Patient Instructions (Signed)
Third Trimester of Pregnancy The third trimester is from week 29 through week 42, months 7 through 9. The third trimester is a time when the fetus is growing rapidly. At the end of the ninth month, the fetus is about 20 inches in length and weighs 6-10 pounds.  BODY CHANGES Your body goes through many changes during pregnancy. The changes vary from woman to woman.   Your weight will continue to increase. You can expect to gain 25-35 pounds (11-16 kg) by the end of the pregnancy.  You may begin to get stretch marks on your hips, abdomen, and breasts.  You may urinate more often because the fetus is moving lower into your pelvis and pressing on your bladder.  You may develop or continue to have heartburn as a result of your pregnancy.  You may develop constipation because certain hormones are causing the muscles that push waste through your intestines to slow down.  You may develop hemorrhoids or swollen, bulging veins (varicose veins).  You may have pelvic pain because of the weight gain and pregnancy hormones relaxing your joints between the bones in your pelvis. Backaches may result from overexertion of the muscles supporting your posture.  You may have changes in your hair. These can include thickening of your hair, rapid growth, and changes in texture. Some women also have hair loss during or after pregnancy, or hair that feels dry or thin. Your hair will most likely return to normal after your baby is born.  Your breasts will continue to grow and be tender. A yellow discharge may leak from your breasts called colostrum.  Your belly button may stick out.  You may feel short of breath because of your expanding uterus.  You may notice the fetus "dropping," or moving lower in your abdomen.  You may have a bloody mucus discharge. This usually occurs a few days to a week before labor begins.  Your cervix becomes thin and soft (effaced) near your due date. WHAT TO EXPECT AT YOUR  PRENATAL EXAMS  You will have prenatal exams every 2 weeks until week 36. Then, you will have weekly prenatal exams. During a routine prenatal visit:  You will be weighed to make sure you and the fetus are growing normally.  Your blood pressure is taken.  Your abdomen will be measured to track your baby's growth.  The fetal heartbeat will be listened to.  Any test results from the previous visit will be discussed.  You may have a cervical check near your due date to see if you have effaced. At around 36 weeks, your caregiver will check your cervix. At the same time, your caregiver will also perform a test on the secretions of the vaginal tissue. This test is to determine if a type of bacteria, Group B streptococcus, is present. Your caregiver will explain this further. Your caregiver may ask you:  What your birth plan is.  How you are feeling.  If you are feeling the baby move.  If you have had any abnormal symptoms, such as leaking fluid, bleeding, severe headaches, or abdominal cramping.  If you are using any tobacco products, including cigarettes, chewing tobacco, and electronic cigarettes.  If you have any questions. Other tests or screenings that may be performed during your third trimester include:  Blood tests that check for low iron levels (anemia).  Fetal testing to check the health, activity level, and growth of the fetus. Testing is done if you have certain medical conditions or if   there are problems during the pregnancy.  HIV (human immunodeficiency virus) testing. If you are at high risk, you may be screened for HIV during your third trimester of pregnancy. FALSE LABOR You may feel small, irregular contractions that eventually go away. These are called Braxton Hicks contractions, or false labor. Contractions may last for hours, days, or even weeks before true labor sets in. If contractions come at regular intervals, intensify, or become painful, it is best to be seen  by your caregiver.  SIGNS OF LABOR   Menstrual-like cramps.  Contractions that are 5 minutes apart or less.  Contractions that start on the top of the uterus and spread down to the lower abdomen and back.  A sense of increased pelvic pressure or back pain.  A watery or bloody mucus discharge that comes from the vagina. If you have any of these signs before the 37th week of pregnancy, call your caregiver right away. You need to go to the hospital to get checked immediately. HOME CARE INSTRUCTIONS   Avoid all smoking, herbs, alcohol, and unprescribed drugs. These chemicals affect the formation and growth of the baby.  Do not use any tobacco products, including cigarettes, chewing tobacco, and electronic cigarettes. If you need help quitting, ask your health care provider. You may receive counseling support and other resources to help you quit.  Follow your caregiver's instructions regarding medicine use. There are medicines that are either safe or unsafe to take during pregnancy.  Exercise only as directed by your caregiver. Experiencing uterine cramps is a good sign to stop exercising.  Continue to eat regular, healthy meals.  Wear a good support bra for breast tenderness.  Do not use hot tubs, steam rooms, or saunas.  Wear your seat belt at all times when driving.  Avoid raw meat, uncooked cheese, cat litter boxes, and soil used by cats. These carry germs that can cause birth defects in the baby.  Take your prenatal vitamins.  Take 1500-2000 mg of calcium daily starting at the 20th week of pregnancy until you deliver your baby.  Try taking a stool softener (if your caregiver approves) if you develop constipation. Eat more high-fiber foods, such as fresh vegetables or fruit and whole grains. Drink plenty of fluids to keep your urine clear or pale yellow.  Take warm sitz baths to soothe any pain or discomfort caused by hemorrhoids. Use hemorrhoid cream if your caregiver  approves.  If you develop varicose veins, wear support hose. Elevate your feet for 15 minutes, 3-4 times a day. Limit salt in your diet.  Avoid heavy lifting, wear low heal shoes, and practice good posture.  Rest a lot with your legs elevated if you have leg cramps or low back pain.  Visit your dentist if you have not gone during your pregnancy. Use a soft toothbrush to brush your teeth and be gentle when you floss.  A sexual relationship may be continued unless your caregiver directs you otherwise.  Do not travel far distances unless it is absolutely necessary and only with the approval of your caregiver.  Take prenatal classes to understand, practice, and ask questions about the labor and delivery.  Make a trial run to the hospital.  Pack your hospital bag.  Prepare the baby's nursery.  Continue to go to all your prenatal visits as directed by your caregiver. SEEK MEDICAL CARE IF:  You are unsure if you are in labor or if your water has broken.  You have dizziness.  You have   mild pelvic cramps, pelvic pressure, or nagging pain in your abdominal area.  You have persistent nausea, vomiting, or diarrhea.  You have a bad smelling vaginal discharge.  You have pain with urination. SEEK IMMEDIATE MEDICAL CARE IF:   You have a fever.  You are leaking fluid from your vagina.  You have spotting or bleeding from your vagina.  You have severe abdominal cramping or pain.  You have rapid weight loss or gain.  You have shortness of breath with chest pain.  You notice sudden or extreme swelling of your face, hands, ankles, feet, or legs.  You have not felt your baby move in over an hour.  You have severe headaches that do not go away with medicine.  You have vision changes.   This information is not intended to replace advice given to you by your health care provider. Make sure you discuss any questions you have with your health care provider.   Document Released:  09/07/2001 Document Revised: 10/04/2014 Document Reviewed: 11/14/2012 Elsevier Interactive Patient Education 2016 [employer]  Breastfeeding Deciding to breastfeed is one of the best choices you can make for you and your baby. A change in hormones during pregnancy causes your breast tissue to grow and increases the number and size of your milk ducts. These hormones also allow proteins, sugars, and fats from your blood supply to make breast milk in your milk-producing glands. Hormones prevent breast milk from being released before your baby is born as well as prompt milk flow after birth. Once breastfeeding has begun, thoughts of your baby, as well as his or her sucking or crying, can stimulate the release of milk from your milk-producing glands.  BENEFITS OF BREASTFEEDING For Your Baby  Your first milk (colostrum) helps your baby's digestive system function better.  There are antibodies in your milk that help your baby fight off infections.  Your baby has a lower incidence of asthma, allergies, and sudden infant death syndrome.  The nutrients in breast milk are better for your baby than infant formulas and are designed uniquely for your baby's needs.  Breast milk improves your baby's brain development.  Your baby is less likely to develop other conditions, such as childhood obesity, asthma, or type 2 diabetes mellitus. For You  Breastfeeding helps to create a very special bond between you and your baby.  Breastfeeding is convenient. Breast milk is always available at the correct temperature and costs nothing.  Breastfeeding helps to burn calories and helps you lose the weight gained during pregnancy.  Breastfeeding makes your uterus contract to its prepregnancy size faster and slows bleeding (lochia) after you give birth.   Breastfeeding helps to lower your risk of developing type 2 diabetes mellitus, osteoporosis, and breast or ovarian cancer later in life. SIGNS THAT YOUR BABY IS  HUNGRY Early Signs of Hunger  Increased alertness or activity.  Stretching.  Movement of the head from side to side.  Movement of the head and opening of the mouth when the corner of the mouth or cheek is stroked (rooting).  Increased sucking sounds, smacking lips, cooing, sighing, or squeaking.  Hand-to-mouth movements.  Increased sucking of fingers or hands. Late Signs of Hunger  Fussing.  Intermittent crying. Extreme Signs of Hunger Signs of extreme hunger will require calming and consoling before your baby will be able to breastfeed successfully. Do not wait for the following signs of extreme hunger to occur before you initiate breastfeeding:  Restlessness.  A loud, strong cry.  Screaming.   BREASTFEEDING BASICS Breastfeeding Initiation  Find a comfortable place to sit or lie down, with your neck and back well supported.  Place a pillow or rolled up blanket under your baby to bring him or her to the level of your breast (if you are seated). Nursing pillows are specially designed to help support your arms and your baby while you breastfeed.  Make sure that your baby's abdomen is facing your abdomen.  Gently massage your breast. With your fingertips, massage from your chest wall toward your nipple in a circular motion. This encourages milk flow. You may need to continue this action during the feeding if your milk flows slowly.  Support your breast with 4 fingers underneath and your thumb above your nipple. Make sure your fingers are well away from your nipple and your baby's mouth.  Stroke your baby's lips gently with your finger or nipple.  When your baby's mouth is open wide enough, quickly bring your baby to your breast, placing your entire nipple and as much of the colored area around your nipple (areola) as possible into your baby's mouth.  More areola should be visible above your baby's upper lip than below the lower lip.  Your baby's tongue should be between his  or her lower gum and your breast.  Ensure that your baby's mouth is correctly positioned around your nipple (latched). Your baby's lips should create a seal on your breast and be turned out (everted).  It is common for your baby to suck about 2-3 minutes in order to start the flow of breast milk. Latching Teaching your baby how to latch on to your breast properly is very important. An improper latch can cause nipple pain and decreased milk supply for you and poor weight gain in your baby. Also, if your baby is not latched onto your nipple properly, he or she may swallow some air during feeding. This can make your baby fussy. Burping your baby when you switch breasts during the feeding can help to get rid of the air. However, teaching your baby to latch on properly is still the best way to prevent fussiness from swallowing air while breastfeeding. Signs that your baby has successfully latched on to your nipple:  Silent tugging or silent sucking, without causing you pain.  Swallowing heard between every 3-4 sucks.  Muscle movement above and in front of his or her ears while sucking. Signs that your baby has not successfully latched on to nipple:  Sucking sounds or smacking sounds from your baby while breastfeeding.  Nipple pain. If you think your baby has not latched on correctly, slip your finger into the corner of your baby's mouth to break the suction and place it between your baby's gums. Attempt breastfeeding initiation again. Signs of Successful Breastfeeding Signs from your baby:  A gradual decrease in the number of sucks or complete cessation of sucking.  Falling asleep.  Relaxation of his or her body.  Retention of a small amount of milk in his or her mouth.  Letting go of your breast by himself or herself. Signs from you:  Breasts that have increased in firmness, weight, and size 1-3 hours after feeding.  Breasts that are softer immediately after  breastfeeding.  Increased milk volume, as well as a change in milk consistency and color by the fifth day of breastfeeding.  Nipples that are not sore, cracked, or bleeding. Signs That Your Baby is Getting Enough Milk  Wetting at least 3 diapers in a 24-hour period.   The urine should be clear and pale yellow by age 5 days.  At least 3 stools in a 24-hour period by age 5 days. The stool should be soft and yellow.  At least 3 stools in a 24-hour period by age 7 days. The stool should be seedy and yellow.  No loss of weight greater than 10% of birth weight during the first 3 days of age.  Average weight gain of 4-7 ounces (113-198 g) per week after age 4 days.  Consistent daily weight gain by age 5 days, without weight loss after the age of 2 weeks. After a feeding, your baby may spit up a small amount. This is common. BREASTFEEDING FREQUENCY AND DURATION Frequent feeding will help you make more milk and can prevent sore nipples and breast engorgement. Breastfeed when you feel the need to reduce the fullness of your breasts or when your baby shows signs of hunger. This is called "breastfeeding on demand." Avoid introducing a pacifier to your baby while you are working to establish breastfeeding (the first 4-6 weeks after your baby is born). After this time you may choose to use a pacifier. Research has shown that pacifier use during the first year of a baby's life decreases the risk of sudden infant death syndrome (SIDS). Allow your baby to feed on each breast as long as he or she wants. Breastfeed until your baby is finished feeding. When your baby unlatches or falls asleep while feeding from the first breast, offer the second breast. Because newborns are often sleepy in the first few weeks of life, you may need to awaken your baby to get him or her to feed. Breastfeeding times will vary from baby to baby. However, the following rules can serve as a guide to help you ensure that your baby is  properly fed:  Newborns (babies 4 weeks of age or younger) may breastfeed every 1-3 hours.  Newborns should not go longer than 3 hours during the day or 5 hours during the night without breastfeeding.  You should breastfeed your baby a minimum of 8 times in a 24-hour period until you begin to introduce solid foods to your baby at around 6 months of age. BREAST MILK PUMPING Pumping and storing breast milk allows you to ensure that your baby is exclusively fed your breast milk, even at times when you are unable to breastfeed. This is especially important if you are going back to work while you are still breastfeeding or when you are not able to be present during feedings. Your lactation consultant can give you guidelines on how long it is safe to store breast milk. A breast pump is a machine that allows you to pump milk from your breast into a sterile bottle. The pumped breast milk can then be stored in a refrigerator or freezer. Some breast pumps are operated by hand, while others use electricity. Ask your lactation consultant which type will work best for you. Breast pumps can be purchased, but some hospitals and breastfeeding support groups lease breast pumps on a monthly basis. A lactation consultant can teach you how to hand express breast milk, if you prefer not to use a pump. CARING FOR YOUR BREASTS WHILE YOU BREASTFEED Nipples can become dry, cracked, and sore while breastfeeding. The following recommendations can help keep your breasts moisturized and healthy:  Avoid using soap on your nipples.  Wear a supportive bra. Although not required, special nursing bras and tank tops are designed to allow access to your   breasts for breastfeeding without taking off your entire bra or top. Avoid wearing underwire-style bras or extremely tight bras.  Air dry your nipples for 3-4minutes after each feeding.  Use only cotton bra pads to absorb leaked breast milk. Leaking of breast milk between feedings  is normal.  Use lanolin on your nipples after breastfeeding. Lanolin helps to maintain your skin's normal moisture barrier. If you use pure lanolin, you do not need to wash it off before feeding your baby again. Pure lanolin is not toxic to your baby. You may also hand express a few drops of breast milk and gently massage that milk into your nipples and allow the milk to air dry. In the first few weeks after giving birth, some women experience extremely full breasts (engorgement). Engorgement can make your breasts feel heavy, warm, and tender to the touch. Engorgement peaks within 3-5 days after you give birth. The following recommendations can help ease engorgement:  Completely empty your breasts while breastfeeding or pumping. You may want to start by applying warm, moist heat (in the shower or with warm water-soaked hand towels) just before feeding or pumping. This increases circulation and helps the milk flow. If your baby does not completely empty your breasts while breastfeeding, pump any extra milk after he or she is finished.  Wear a snug bra (nursing or regular) or tank top for 1-2 days to signal your body to slightly decrease milk production.  Apply ice packs to your breasts, unless this is too uncomfortable for you.  Make sure that your baby is latched on and positioned properly while breastfeeding. If engorgement persists after 48 hours of following these recommendations, contact your health care provider or a lactation consultant. OVERALL HEALTH CARE RECOMMENDATIONS WHILE BREASTFEEDING  Eat healthy foods. Alternate between meals and snacks, eating 3 of each per day. Because what you eat affects your breast milk, some of the foods may make your baby more irritable than usual. Avoid eating these foods if you are sure that they are negatively affecting your baby.  Drink milk, fruit juice, and water to satisfy your thirst (about 10 glasses a day).  Rest often, relax, and continue to take  your prenatal vitamins to prevent fatigue, stress, and anemia.  Continue breast self-awareness checks.  Avoid chewing and smoking tobacco. Chemicals from cigarettes that pass into breast milk and exposure to secondhand smoke may harm your baby.  Avoid alcohol and drug use, including marijuana. Some medicines that may be harmful to your baby can pass through breast milk. It is important to ask your health care provider before taking any medicine, including all over-the-counter and prescription medicine as well as vitamin and herbal supplements. It is possible to become pregnant while breastfeeding. If birth control is desired, ask your health care provider about options that will be safe for your baby. SEEK MEDICAL CARE IF:  You feel like you want to stop breastfeeding or have become frustrated with breastfeeding.  You have painful breasts or nipples.  Your nipples are cracked or bleeding.  Your breasts are red, tender, or warm.  You have a swollen area on either breast.  You have a fever or chills.  You have nausea or vomiting.  You have drainage other than breast milk from your nipples.  Your breasts do not become full before feedings by the fifth day after you give birth.  You feel sad and depressed.  Your baby is too sleepy to eat well.  Your baby is having trouble sleeping.     Your baby is wetting less than 3 diapers in a 24-hour period.  Your baby has less than 3 stools in a 24-hour period.  Your baby's skin or the white part of his or her eyes becomes yellow.   Your baby is not gaining weight by 5 days of age. SEEK IMMEDIATE MEDICAL CARE IF:  Your baby is overly tired (lethargic) and does not want to wake up and feed.  Your baby develops an unexplained fever.   This information is not intended to replace advice given to you by your health care provider. Make sure you discuss any questions you have with your health care provider.   Document Released: 09/13/2005  Document Revised: 06/04/2015 Document Reviewed: 03/07/2013 [employer] Interactive Patient Education 2016 [employer]  

## 2016-06-14 NOTE — Progress Notes (Signed)
Patient states that she overall feels well not feeling and pain/pressure and no contractions. Patient states that she does not have to urinate, patient is drinking water.

## 2016-06-15 LAB — GC/CHLAMYDIA PROBE AMP
Chlamydia trachomatis, NAA: NEGATIVE
NEISSERIA GONORRHOEAE BY PCR: NEGATIVE

## 2016-06-16 LAB — STREP GP B NAA: Strep Gp B NAA: NEGATIVE

## 2016-06-17 ENCOUNTER — Encounter (HOSPITAL_COMMUNITY): Payer: Self-pay | Admitting: *Deleted

## 2016-06-17 ENCOUNTER — Inpatient Hospital Stay (HOSPITAL_COMMUNITY)
Admission: AD | Admit: 2016-06-17 | Discharge: 2016-06-17 | Disposition: A | Discharge status: Home / Self Care | Payer: Medicaid Other | Source: Ambulatory Visit | Attending: Obstetrics and Gynecology | Admitting: Obstetrics and Gynecology

## 2016-06-17 DIAGNOSIS — R195 Other fecal abnormalities: Secondary | ICD-10-CM | POA: Diagnosis not present

## 2016-06-17 DIAGNOSIS — Z3A37 37 weeks gestation of pregnancy: Secondary | ICD-10-CM | POA: Insufficient documentation

## 2016-06-17 DIAGNOSIS — Z888 Allergy status to other drugs, medicaments and biological substances status: Secondary | ICD-10-CM | POA: Insufficient documentation

## 2016-06-17 DIAGNOSIS — Z87891 Personal history of nicotine dependence: Secondary | ICD-10-CM | POA: Insufficient documentation

## 2016-06-17 DIAGNOSIS — O26893 Other specified pregnancy related conditions, third trimester: Secondary | ICD-10-CM | POA: Insufficient documentation

## 2016-06-17 LAB — URINE MICROSCOPIC-ADD ON

## 2016-06-17 LAB — URINALYSIS, ROUTINE W REFLEX MICROSCOPIC
Bilirubin Urine: NEGATIVE
GLUCOSE, UA: NEGATIVE mg/dL
HGB URINE DIPSTICK: NEGATIVE
Ketones, ur: NEGATIVE mg/dL
Nitrite: NEGATIVE
PROTEIN: NEGATIVE mg/dL
pH: 6 (ref 5.0–8.0)

## 2016-06-17 NOTE — Discharge Instructions (Signed)
Stool Culture  WHY AM I HAVING THIS TEST?  A stool culture tests your stool (feces) for infections of the intestines that are caused by bacteria, a virus, or parasites. Your health care provider may order this test if you have a fever, abdominal pain, and diarrhea that lasts for more than a few days.  WHAT KIND OF SAMPLE IS TAKEN?  A stool sample is required for this test. You will collect the sample when you have a bowel movement.  WILL I NEED TO COLLECT SAMPLES AT HOME?  You will collect a sample of your stool at home. Your health care provider will give you instructions. Carefully follow them each time you collect a sample. Your health care provider will also give you all of the supplies that you need. For each sample that you collect, you may get:  · A small container. You may be given different colored containers. Each container may come with different instructions.  · Gloves that can be thrown away.  · A plastic bag.  When collecting the sample:  · Do not pour out the fluid that is in the container. This fluid will preserve your sample.  · Choose the parts of the sample that are bloody, slimy, or watery.  · If your stool is hard, choose samples from each end and the middle.  · Do not mix urine, toilet paper, or water with your sample.  You may be instructed to collect the sample in the following way:  · Before you collect the sample:    Cover the toilet bowl with plastic wrap or a plastic bag.    Tape the wrap or bag to the bowl of the toilet, not to the seat. Do not stretch the plastic tight across the bowl. Leave room for your stool to fall during your bowel movement.    Wash and dry any containers that you were given. Keep them in the bathroom.  · When you are ready to collect a sample:    Wash your hands. thoroughly with soap and water.    Put on the gloves that were provided to you.    Using the small shovel that is built into the top of the container, put small scoops of your stool into the container.  Fill the container up to the red line on the label.    Use the shovel to stir the stool sample into the liquid in the container if directed to do so by the instructions that came with the collection container.    Close the lid of the collection container tightly.    Shake the sample until it is well mixed.    On the label, write the date, time, and your initials.    Put the container in the plastic bag that was given to you.    Flush the rest of your stool down the toilet. Throw away the gloves.    Wash your hands thoroughly with soap and water.  · Store the sample using the instructions on the container that you used to collect the sample.  · Repeat this procedure at a later time if you need to collect another sample. Additional samples should be collected at different times.  · Return the sample or samples to your health care provider shortly after you collect them. Check the instructions about when they need to be returned.  HOW DO I PREPARE FOR THIS TEST?  If you are a woman and are menstruating, wait 3 days after the   end of your menstrual cycle before you collect a sample.   WHAT DO THE RESULTS MEAN?  It is your responsibility to obtain your test results. Ask the lab or department performing the test when and how you will get your results. Normal results include:  · Normal intestinal flora. This means that the bacteria and fungi that are present are typically found in your intestines and are present in normal amounts.  · No ova or parasite infestation. This means that there is no evidence of parasites in your intestines.  Abnormal results will specify the bacteria, virus, or parasite that is responsible for the infection.  Talk with your health care provider to discuss your results, treatment options, and if necessary, the need for more tests. Talk with your health care provider if you have any questions about your results.     This information is not intended to replace advice given to you by your health care  provider. Make sure you discuss any questions you have with your health care provider.     Document Released: 10/16/2010 Document Revised: 10/04/2014 Document Reviewed: 02/01/2014  Elsevier Interactive Patient Education ©2016 Elsevier Inc.

## 2016-06-17 NOTE — MAU Note (Signed)
Patient has been having green stools for past few days, but today noticed what "looked like white balls" in the toilet.  Concerned and wanted to be seen.  NO c/o pain.  Reports +fetal movement.  Denies vaginal bleeding or LOF.

## 2016-06-17 NOTE — MAU Provider Note (Signed)
  History     CSN: [id number]  Arrival date and time: 06/17/16 1232   None     Chief Complaint  Patient presents with  . white stool   HPI: Ms Durwin NoraDixon is a 25 yo G3P1011 IUP at 6837 4/7 weeks presents with c/o "white balls" in her stool. She has noted a change in her stool color to green over the last few days as well. She denies any blood or melanotic stools. No diet changes. She reports + Fm, denies ut ctx, VB or LOF.    Past Medical History:  Diagnosis Date  . Depression   . Infection    UTI  . OCD (obsessive compulsive disorder)   . PTSD (post-traumatic stress disorder)     Past Surgical History:  Procedure Laterality Date  . NO PAST SURGERIES      Family History  Problem Relation Age of Onset  . Cancer Maternal Grandmother     breast  . Cancer Paternal Grandfather     Social History  Substance Use Topics  . Smoking status: Former Smoker    Types: Cigarettes    Quit date: 08/18/2006  . Smokeless tobacco: Never Used     Comment: age 25  . Alcohol use No    Allergies:  Allergies  Allergen Reactions  . Chocolate Anaphylaxis and Rash  . 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    . Metrogel [Metronidazole] Swelling and Other (See Comments)    Reaction:  Swelling of vulva     Prescriptions Prior to Admission  Medication Sig Dispense Refill Last Dose  . ferrous sulfate 325 (65 FE) MG tablet Take 1 tablet (325 mg total) by mouth 2 (two) times daily. (Patient not taking: Reported on 06/14/2016) 60 tablet 0 Not Taking  . Prenat-FeAsp-Meth-FA-DHA w/o A (PRENATE PIXIE) 10-0.6-0.4-200 MG CAPS Take 1 capsule by mouth daily before breakfast. 90 capsule 4 Taking    Review of Systems  Gastrointestinal: Negative for abdominal pain, blood in stool, constipation, diarrhea, heartburn, melena, nausea and vomiting.   Physical Exam   Blood pressure 122/69, pulse 100, temperature 98.2 F (36.8 C), temperature source  Oral, last menstrual period 09/28/2015, SpO2 98 %, unknown if currently breastfeeding.  Physical Exam  Constitutional: She appears well-developed and well-nourished.  Cardiovascular: Regular rhythm.   Respiratory: Breath sounds normal.  GI: Soft. Bowel sounds are normal.    MAU Course  Procedures  MDM   Assessment and Plan  IUP 37 4/7 weeks Change in stool  Pt reassured that stool changes can happen with pregnancy. However will have pt collect stool sample at home and send for studies. Pt instructed to keep routine OB appt.  Hermina StaggersMichael L Nealie Mchatton 06/17/2016, 1:25 PM

## 2016-06-25 ENCOUNTER — Ambulatory Visit (INDEPENDENT_AMBULATORY_CARE_PROVIDER_SITE_OTHER): Payer: Medicaid Other | Admitting: Certified Nurse Midwife

## 2016-06-25 DIAGNOSIS — Z3483 Encounter for supervision of other normal pregnancy, third trimester: Secondary | ICD-10-CM | POA: Diagnosis not present

## 2016-06-25 NOTE — Progress Notes (Signed)
Subjective:    Shelby Duncan is a 25 y.o. female being seen today for her obstetrical visit. She is at 7568w5d gestation. Patient reports backache, headache, no bleeding, no cramping, no leaking and occasional contractions. Fetal movement: normal.  Has not tried anything for her HA.  Forgot stool sample.  Encouraged her to bring in on Monday.    Problem List Items Addressed This Visit      Other   Supervision of normal pregnancy, antepartum    Other Visit Diagnoses   None.    Patient Active Problem List   Diagnosis Date Noted  . Supervision of normal pregnancy, antepartum 06/11/2016  . 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 Twin pregnancy w antenatal problem 12/11/2015  . Depression 09/24/2013  . Schizophrenia (HCC) 09/24/2013    Objective:    BP 116/74   Pulse 86   Temp 98.2 F (36.8 C)   Wt 224 lb 4.8 oz (101.7 kg)   LMP 09/28/2015   BMI 33.12 kg/m  FHT: 160 BPM  Uterine Size: 34 cm and size equals dates  Presentations: cephalic  Pelvic Exam: deferred     Assessment:    Pregnancy @ 4568w5d weeks   HA in pregnancy  Vanishing twin syndrome  Plan:   Plans for delivery: Vaginal anticipated; labs reviewed; problem list updated Counseling: Consent signed. Infant feeding: plans to breastfeed. Cigarette smoking: never smoked. L&D discussion: symptoms of labor, discussed when to call, discussed what number to call, anesthetic/analgesic options reviewed and delivering clinician:  plans no preference. Postpartum supports and preparation: circumcision discussed and contraception plans discussed.  Follow up in 1 Week.

## 2016-06-30 ENCOUNTER — Encounter (HOSPITAL_COMMUNITY): Payer: Self-pay | Admitting: *Deleted

## 2016-06-30 ENCOUNTER — Inpatient Hospital Stay (HOSPITAL_COMMUNITY)
Admission: AD | Admit: 2016-06-30 | Discharge: 2016-07-02 | DRG: 775 | Disposition: A | Discharge status: Home / Self Care | Payer: Medicaid Other | Source: Ambulatory Visit | Attending: Obstetrics & Gynecology | Admitting: Obstetrics & Gynecology

## 2016-06-30 DIAGNOSIS — F209 Schizophrenia, unspecified: Secondary | ICD-10-CM | POA: Diagnosis present

## 2016-06-30 DIAGNOSIS — F251 Schizoaffective disorder, depressive type: Secondary | ICD-10-CM | POA: Diagnosis not present

## 2016-06-30 DIAGNOSIS — Z3480 Encounter for supervision of other normal pregnancy, unspecified trimester: Secondary | ICD-10-CM

## 2016-06-30 DIAGNOSIS — D649 Anemia, unspecified: Secondary | ICD-10-CM | POA: Diagnosis present

## 2016-06-30 DIAGNOSIS — Z3A39 39 weeks gestation of pregnancy: Secondary | ICD-10-CM

## 2016-06-30 DIAGNOSIS — O99343 Other mental disorders complicating pregnancy, third trimester: Secondary | ICD-10-CM | POA: Diagnosis not present

## 2016-06-30 DIAGNOSIS — O99344 Other mental disorders complicating childbirth: Secondary | ICD-10-CM | POA: Diagnosis present

## 2016-06-30 DIAGNOSIS — Z87891 Personal history of nicotine dependence: Secondary | ICD-10-CM

## 2016-06-30 DIAGNOSIS — Z3483 Encounter for supervision of other normal pregnancy, third trimester: Secondary | ICD-10-CM | POA: Diagnosis present

## 2016-06-30 DIAGNOSIS — O9902 Anemia complicating childbirth: Principal | ICD-10-CM | POA: Diagnosis present

## 2016-06-30 LAB — COMPREHENSIVE METABOLIC PANEL
ALBUMIN: 3.3 g/dL — AB (ref 3.5–5.0)
ALT: 9 U/L — AB (ref 14–54)
AST: 19 U/L (ref 15–41)
Alkaline Phosphatase: 119 U/L (ref 38–126)
Anion gap: 6 (ref 5–15)
BUN: 9 mg/dL (ref 6–20)
CHLORIDE: 107 mmol/L (ref 101–111)
CO2: 22 mmol/L (ref 22–32)
Calcium: 9.3 mg/dL (ref 8.9–10.3)
Creatinine, Ser: 0.41 mg/dL — ABNORMAL LOW (ref 0.44–1.00)
GFR calc Af Amer: >60 mL/min (ref >60)
Glucose, Bld: 87 mg/dL (ref 65–99)
POTASSIUM: 4.4 mmol/L (ref 3.5–5.1)
SODIUM: 135 mmol/L (ref 135–145)
Total Bilirubin: 0.5 mg/dL (ref 0.3–1.2)
Total Protein: 7 g/dL (ref 6.5–8.1)

## 2016-06-30 LAB — CBC
HEMATOCRIT: 31.1 % — AB (ref 36.0–46.0)
HEMOGLOBIN: 10.4 g/dL — AB (ref 12.0–15.0)
MCH: 26.8 pg (ref 26.0–34.0)
MCHC: 33.4 g/dL (ref 30.0–36.0)
MCV: 80.2 fL (ref 78.0–100.0)
PLATELETS: 332 10*3/uL (ref 150–400)
RBC: 3.88 MIL/uL (ref 3.87–5.11)
RDW: 14.3 % (ref 11.5–15.5)
WBC: 15.2 10*3/uL — AB (ref 4.0–10.5)

## 2016-06-30 LAB — PROTEIN / CREATININE RATIO, URINE
CREATININE, URINE: 72 mg/dL
Protein Creatinine Ratio: 0.11 mg/mg{Cre} (ref 0.00–0.15)
Total Protein, Urine: 8 mg/dL

## 2016-06-30 LAB — TYPE AND SCREEN
ABO/RH(D): B POS
Antibody Screen: NEGATIVE

## 2016-06-30 LAB — OB RESULTS CONSOLE GBS: STREP GROUP B AG: NEGATIVE

## 2016-06-30 MED ORDER — SOD CITRATE-CITRIC ACID 500-334 MG/5ML PO SOLN: 30.0000 mL | ORAL | Status: DC | PRN

## 2016-06-30 MED ORDER — DIBUCAINE 1 % RE OINT
1.0000 "application " | TOPICAL_OINTMENT | RECTAL | Status: DC | PRN
  Filled 2016-06-30: qty 56.7

## 2016-06-30 MED ORDER — OXYTOCIN BOLUS FROM INFUSION
500.0000 mL | Freq: Once | INTRAVENOUS | Status: AC
  Administered 2016-06-30: 500 mL via INTRAVENOUS

## 2016-06-30 MED ORDER — SENNOSIDES-DOCUSATE SODIUM 8.6-50 MG PO TABS
2.0000 | ORAL_TABLET | ORAL | Status: DC
  Filled 2016-06-30 (×2): qty 2

## 2016-06-30 MED ORDER — ACETAMINOPHEN 325 MG PO TABS: 650.0000 mg | ORAL_TABLET | ORAL | Status: DC | PRN

## 2016-06-30 MED ORDER — TETANUS-DIPHTH-ACELL PERTUSSIS 5-2.5-18.5 LF-MCG/0.5 IM SUSP
0.5000 mL | Freq: Once | INTRAMUSCULAR | Status: DC
  Filled 2016-06-30: qty 0.5

## 2016-06-30 MED ORDER — IBUPROFEN 600 MG PO TABS
600.0000 mg | ORAL_TABLET | Freq: Four times a day (QID) | ORAL | Status: DC
  Filled 2016-06-30 (×4): qty 1

## 2016-06-30 MED ORDER — SIMETHICONE 80 MG PO CHEW: 80.0000 mg | CHEWABLE_TABLET | ORAL | Status: DC | PRN

## 2016-06-30 MED ORDER — ZOLPIDEM TARTRATE 5 MG PO TABS: 5.0000 mg | ORAL_TABLET | Freq: Every evening | ORAL | Status: DC | PRN

## 2016-06-30 MED ORDER — DIPHENHYDRAMINE HCL 25 MG PO CAPS: 25.0000 mg | ORAL_CAPSULE | Freq: Four times a day (QID) | ORAL | Status: DC | PRN

## 2016-06-30 MED ORDER — ONDANSETRON HCL 4 MG/2ML IJ SOLN: 4.0000 mg | INTRAMUSCULAR | Status: DC | PRN

## 2016-06-30 MED ORDER — OXYTOCIN 40 UNITS IN LACTATED RINGERS INFUSION - SIMPLE MED
2.5000 [IU]/h | INTRAVENOUS | Status: DC
  Filled 2016-06-30: qty 1000

## 2016-06-30 MED ORDER — ONDANSETRON HCL 4 MG/2ML IJ SOLN: 4.0000 mg | Freq: Four times a day (QID) | INTRAMUSCULAR | Status: DC | PRN

## 2016-06-30 MED ORDER — COCONUT OIL OIL
1.0000 "application " | TOPICAL_OIL | Status: DC | PRN
  Filled 2016-06-30: qty 120

## 2016-06-30 MED ORDER — OXYTOCIN 40 UNITS IN LACTATED RINGERS INFUSION - SIMPLE MED
1.0000 m[IU]/min | INTRAVENOUS | Status: DC
  Administered 2016-06-30: 2 m[IU]/min via INTRAVENOUS

## 2016-06-30 MED ORDER — BENZOCAINE-MENTHOL 20-0.5 % EX AERO
1.0000 "application " | INHALATION_SPRAY | CUTANEOUS | Status: DC | PRN
  Filled 2016-06-30: qty 56

## 2016-06-30 MED ORDER — LACTATED RINGERS IV SOLN: INTRAVENOUS | Status: DC

## 2016-06-30 MED ORDER — WITCH HAZEL-GLYCERIN EX PADS: 1.0000 "application " | MEDICATED_PAD | CUTANEOUS | Status: DC | PRN

## 2016-06-30 MED ORDER — LACTATED RINGERS IV SOLN: 500.0000 mL | INTRAVENOUS | Status: DC | PRN

## 2016-06-30 MED ORDER — TERBUTALINE SULFATE 1 MG/ML IJ SOLN: 0.2500 mg | Freq: Once | INTRAMUSCULAR | Status: DC | PRN

## 2016-06-30 MED ORDER — LIDOCAINE HCL (PF) 1 % IJ SOLN
30.0000 mL | INTRAMUSCULAR | Status: DC | PRN
  Filled 2016-06-30: qty 30

## 2016-06-30 MED ORDER — PRENATAL MULTIVITAMIN CH
1.0000 | ORAL_TABLET | Freq: Every day | ORAL | Status: DC
  Filled 2016-06-30: qty 1

## 2016-06-30 MED ORDER — ONDANSETRON HCL 4 MG PO TABS: 4.0000 mg | ORAL_TABLET | ORAL | Status: DC | PRN

## 2016-06-30 NOTE — H&P (Signed)
LABOR AND DELIVERY ADMISSION HISTORY AND PHYSICAL NOTE  Kandice Moosakiesha N Filippi is a 25 y.o. female G3P1011 with IUP at 4527w3d by LMP presenting for SOL.   Began feeling contractions this morning around 6am about 15 minutes apart. Have become stronger and more frequent, now occurring 6 minutes apart.   She reports positive fetal movement. She denies leakage of fluid or vaginal bleeding.  Prenatal History/Complications:  Past Medical History: Past Medical History:  Diagnosis Date  . Depression   . Infection    UTI  . OCD (obsessive compulsive disorder)   . PTSD (post-traumatic stress disorder)     Past Surgical History: Past Surgical History:  Procedure Laterality Date  . NO PAST SURGERIES      Obstetrical History: OB History    Gravida Para Term Preterm AB Living   3 1 1  0 1 1   SAB TAB Ectopic Multiple Live Births   1 0 0 0 1      Social History: Social History   Social History  . Marital status: Single    Spouse name: N/A  . Number of children: N/A  . Years of education: N/A   Social History Main Topics  . Smoking status: Former Smoker    Types: Cigarettes    Quit date: 08/18/2006  . Smokeless tobacco: Never Used     Comment: age 25  . Alcohol use No  . Drug use: No  . Sexual activity: Yes    Birth control/ protection: None   Other Topics Concern  . None   Social History Narrative  . None    Family History: Family History  Problem Relation Age of Onset  . Cancer Maternal Grandmother     breast  . Cancer Paternal Grandfather     Allergies: Allergies  Allergen Reactions  . Chocolate Anaphylaxis and Rash  . 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    . Metrogel [Metronidazole] Swelling and Other (See Comments)    Reaction:  Swelling of vulva     Prescriptions Prior to Admission  Medication Sig Dispense Refill Last Dose  . Prenat-FeAsp-Meth-FA-DHA w/o A (PRENATE PIXIE) 10-0.6-0.4-200 MG CAPS  Take 1 capsule by mouth daily before breakfast. 90 capsule 4 Past Week at Unknown time  . ferrous sulfate 325 (65 FE) MG tablet Take 1 tablet (325 mg total) by mouth 2 (two) times daily. (Patient not taking: Reported on 06/17/2016) 60 tablet 0 Not Taking at Unknown time     Review of Systems   All systems reviewed and negative except as stated in HPI  Blood pressure 140/78, pulse 91, temperature 97.7 F (36.5 C), temperature source Oral, resp. rate 20, last menstrual period 09/28/2015, SpO2 99 %, unknown if currently breastfeeding. General appearance: alert, cooperative and no distress Lungs: no respiratory distress Heart: regular rate Abdomen: soft, non-tender Extremities: No calf swelling or tenderness Presentation: cephalic by exam 0/459/29 Fetal monitoring: Baseline 140, variable accelerations, no decelerations Uterine activity: q6 minutes Dilation: 6.5 Effacement (%): 80 Station: -2 Exam by:: L. Paschal, RN    Prenatal labs: ABO, Rh: B/POS/-- (02/03 1420) Antibody: NEG (02/03 1420) Rubella: immune RPR: Non Reactive (07/21 1052)  HBsAg: NEGATIVE (02/03 1420)  HIV: Non Reactive (07/21 1052)  GBS: Negative (09/18 1325)   Prenatal Transfer Tool  Maternal Diabetes: No Genetic Screening: Normal Maternal Ultrasounds/Referrals: Normal Fetal Ultrasounds or other Referrals:  Referred to Materal Fetal Medicine  Maternal Substance Abuse:  No  Significant Maternal Medications:  None Significant Maternal Lab Results: None  No results found for this or any previous visit (from the past 24 hour(s)).  Patient Active Problem List   Diagnosis Date Noted  . Supervision of normal pregnancy, antepartum 06/11/2016  . 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 Twin pregnancy w antenatal problem 12/11/2015  . Depression 09/24/2013  . Schizophrenia (HCC) 09/24/2013    Assessment: GWENDOLIN BRIEL is a 25 y.o. G3P1011 at [redacted]w[redacted]d here for SOL  #elevated bp: asymptomatic. Mild elevation. Will  continue to monitor and check preE labs. #Labor: SOL #Pain: none #FWB: Category 1 tracing #ID:  GBS negative #MOF: both #MOC: undecided #Circ:  N/a #MDD/schizophrenia: stable, not on meds  Tillman Sers, DO PGY-1 10/4/201710:09 AM

## 2016-06-30 NOTE — Lactation Note (Signed)
Lactation Consultation Note  Patient Name: Shelby Duncan: 06/30/2016 Reason for consult: Initial assessment   Initial assessment with Exp BF mom [facility] < [place] [facility] [place] [facility]. Mom reports she BF her 25 yo for 2 years with occasional formula supplementation. Mom reports she plans to BF early on and give formula later when she needs to ba away from her infant.   Infant STS with mom and cueing to feed. Infant latched easily to left breast in the laid back position. She was on and off the breast and was noted to be tongue sucking. She BF on and off for 20 minutes. She was noted to push bottom of nipple out of her mouth and mom noted pain .Assisted mom to relatch infant as needed due to pain. Nipple was noted to be asymmetrical when mom was c/o pain. Infant was noted to have tongue pulled back behind gumline when sucking on a gloved finger. Infant was then placed to right breast and stayed on the right side a little better. Mom did not c/o the pain to the right nipple, nipple was round when infant came off the breast. Infant with flanged lips, rhythmic suckling and a few intermittent swallows were noted.   Breasts are large, soft and compressible. Nipples are everted and areola is compressible. Mom did well assisting with latching infant. Discussed supply and demand and importance of establishing a good milk supply with BF on demand in the early few weeks. Mom voiced understanding. Discussed colostrum, milk coming to volume and supply and demand. Attempted to hand express mom on both breasts, no colostrum was visible at this time. Showed mom how to hand express and encouraged her to do so prior to feeding.   BF Resources Handout and LC Brochure given, mom informed of IP/OP Services, BF Support Groups and LC phone #. Enc mom to call out to desk for feeding assistance as needed. Follow up tomorrow and prn.      Maternal Data Formula Feeding for Exclusion: Yes Reason for  exclusion: Mother's choice to formula and breast feed on admission Has patient been taught Hand Expression?: Yes Does the patient have breastfeeding experience prior to this delivery?: Yes  Feeding Feeding Type: Breast Fed Length of feed: 20 min  LATCH Score/Interventions Latch: Repeated attempts needed to sustain latch, nipple held in mouth throughout feeding, stimulation needed to elicit sucking reflex. Intervention(s): Adjust position;Assist with latch;Breast massage;Breast compression  Audible Swallowing: A few with stimulation Intervention(s): Alternate breast massage;Hand expression;Skin to skin  Type of Nipple: Everted at rest and after stimulation  Comfort (Breast/Nipple): Soft / non-tender     Hold (Positioning): Assistance needed to correctly position infant at breast and maintain latch. Intervention(s): Breastfeeding basics reviewed;Support Pillows;Position options;Skin to skin  LATCH Score: 7  Lactation Tools Discussed/Used     Consult Status Consult Status: Follow-up Duncan: 07/01/16 Follow-up type: In-patient    Shelby Duncan 06/30/2016, 5:54 PM

## 2016-06-30 NOTE — Anesthesia Pain Management Evaluation Note (Signed)
  CRNA Pain Management Visit Note  Patient: Shelby Duncan, 25 y.o., female  "Hello I am a member of the anesthesia team at [facility]. We have an anesthesia team available at all times to provide care throughout the hospital, including epidural management and anesthesia for C-section. I don't know your plan for the delivery whether it a natural birth, water birth, IV sedation, nitrous supplementation, doula or epidural, but we want to meet your pain goals."   1.Was your pain managed to your expectations on prior hospitalizations?   Yes   2.What is your expectation for pain management during this hospitalization?     Labor support without medications  3.How can we help you reach that goal? Natural.  Record the patient's initial score and the patient's pain goal.   Pain: 0  Pain Goal: 10 [facility] wants you to be able to say your pain was always managed very well.  Dyrell Tuccillo L 06/30/2016

## 2016-06-30 NOTE — MAU Note (Signed)
Pt states contractions started about 0600 this morning.  Reports every 10 minutes now.  Good fetal movement.  Denies vaginal bleeding, ROM, or abnormal discharge.

## 2016-07-01 DIAGNOSIS — O99343 Other mental disorders complicating pregnancy, third trimester: Secondary | ICD-10-CM

## 2016-07-01 DIAGNOSIS — Z3A39 39 weeks gestation of pregnancy: Secondary | ICD-10-CM

## 2016-07-01 DIAGNOSIS — F251 Schizoaffective disorder, depressive type: Secondary | ICD-10-CM

## 2016-07-01 LAB — RPR: RPR Ser Ql: NONREACTIVE

## 2016-07-01 MED ORDER — POLYSACCHARIDE IRON COMPLEX 150 MG PO CAPS
150.0000 mg | ORAL_CAPSULE | Freq: Every day | ORAL | Status: DC
  Administered 2016-07-01 – 2016-07-02 (×2): 150 mg via ORAL
  Filled 2016-07-01 (×2): qty 1

## 2016-07-01 NOTE — Clinical Social Work Maternal (Signed)
CLINICAL SOCIAL WORK MATERNAL/CHILD NOTE  Patient Details  Name: Shelby Duncan MRN: [id number] Date of Birth: 1991-02-21  Date:  07/01/2016  Clinical Social Worker Initiating Note:  Laurey Arrow Date/ Time Initiated:  07/01/16/1517     Child's Name:  Beryle Lathe   Legal Guardian:  Mother   Need for Interpreter:  None   Date of Referral:  07/01/16     Reason for Referral:  Behavioral Health Issues, including SI    Referral Source:  Central Nursery   Address:  [address] [place] [address]  Phone number:      Household Members:  Self, Minor Children   Natural Supports (not living in the home):  Friends, Immediate Family, Spouse/significant other, Parent   Professional Supports: Transport planner, Case Air cabin crew at [employer] and Science writer with [employer])   Employment: Unemployed   Type of Work:     Education:  9 to 11 years   Museum/gallery curator Resources:  Medicaid   Other Resources:  [employer], Physicist, medical    Cultural/Religious Considerations Which May Impact Care:  Per MOB, MOB is Muslim.  Strengths:  Ability to meet basic needs , Pediatrician chosen , Understanding of illness, Home prepared for child    Risk Factors/Current Problems:  Mental Health Concerns    Cognitive State:  Alert , Able to Concentrate , Linear Thinking    Mood/Affect:  Flat , Calm , Relaxed , Interested    CSW Assessment: CSW met with MOB to complete an assessment for hx of MDD.  When CSW arrived MOB was being visited by an unknown female.  With MOB's permsission, CSW asked MOB's guest to step out while CSW completed an assessment. MOB was polite and inviting.  CSW held infant during the entire session and was appropriate and appeared to be bonding.  CSW inquired about MOB's MH hx and MOB reported that MOB has a hx of MDD and was being evaluated for schizophrenia.  MOB reported that MOB's, current therapist Ailene Ravel) from [facility] is evaluating and monitoring  MOB's mental health symptoms.  MOB communicated that at this time, MOB's therapist does not feel like MOB is schizophrenic. MOB also reported that MOB has been without medication for the past 9 months and plans to be re-evaluated for medication management in the next 3 weeks by psychiatrists at [facility]. CSW encouraged MOB to keep all of her appointments. CSW also educated MOB about PPD. CSW informed MOB of possible supports and interventions to decrease PPD.  CSW also encouraged MOB to seek medical attention if needed for increased signs and symptoms for PPD.  CSW offered MOB community parenting resources, and MOB declined.  MOB reported that MOB is currently enrolled in the Healthy Start Program, and receives weekly case management with caseworker, Aldona Bar. MOB reports being supported by FOB, MOB's Father, and various friends. MOB communicated that MOB has all the necessity to care for infant.  CSW inquired about MOB's completing requested documents for MOB to receive a birth certificate for infant.  MOB was adamant about the infant not having a birth certificate due to religious practice.  MOB shared with CSW that MOB is Muslim.  CSW reviewed how the infant not having a birth certificate affects medical appointments, social security and education.  MOB appeared to understand and still refused to complete necessary documents for a birth certificate. CSW thanked MOB for meeting with CSW.  CSW reported information regarding infant's birth certificate to the [employer] office  CSW Plan/Description:  Patient/Family Education , No Further Intervention Required/No Barriers to Discharge, Information/Referral to [place], MSW, [employer] Social Work [phone]   Dimple Nanas, LCSW 07/01/2016, 3:19 PM

## 2016-07-01 NOTE — Progress Notes (Signed)
Educated MOB about safe sleep and not sleeping with baby. She is aware. Educated her that co-sleeping increases risk of SIDS.

## 2016-07-01 NOTE — Lactation Note (Signed)
This note was copied from a baby's chart. Lactation Consultation Note  Patient Name: Shelby Duncan'UToday's Date: 07/01/2016 Reason for consult: Follow-up assessment Baby at 24 hr of life. Mom is reporting bilateral nipple soreness. The R has a light pink surface and the L has a compression stripe. Mom is using coconut oil and shells, given comfort gels. Lactation observed baby on the R breast, mom will call for help latching baby to the L breast because she has not been bf from the L due to pain. She does have a Harmony in the room that she has been using on the L side. Discussed baby behavior, feeding frequency, voids, wt loss, breast changes, and nipple care. Mom is aware of lactation services and support group. Lactation phone number is on the white board.    Maternal Data Has patient been taught Hand Expression?: Yes  Feeding Feeding Type: Breast Fed Length of feed: 10 min  LATCH Score/Interventions Latch: Grasps breast easily, tongue down, lips flanged, rhythmical sucking. Intervention(s): Breast compression  Audible Swallowing: A few with stimulation Intervention(s): Skin to skin;Hand expression;Alternate breast massage  Type of Nipple: Everted at rest and after stimulation  Comfort (Breast/Nipple): Filling, red/small blisters or bruises, mild/mod discomfort  Problem noted: Mild/Moderate discomfort;Cracked, bleeding, blisters, bruises Interventions  (Cracked/bleeding/bruising/blister): Expressed breast milk to nipple Interventions (Mild/moderate discomfort): Comfort gels  Hold (Positioning): No assistance needed to correctly position infant at breast.  LATCH Score: 8  Lactation Tools Discussed/Used WIC Program: Yes   Consult Status Consult Status: Follow-up Date: 07/02/16 Follow-up type: In-patient    Rulon Eisenmengerlizabeth E Keashia Haskins 07/01/2016, 5:12 PM

## 2016-07-01 NOTE — Progress Notes (Addendum)
Post Partum Day #1 Subjective: no complaints, up ad lib, voiding and tolerating PO  Objective: Blood pressure 119/60, pulse 84, temperature 98 F (36.7 C), temperature source Oral, resp. rate 18, height 5\' 9"  (1.753 m), weight 224 lb (101.6 kg), last menstrual period 09/28/2015, SpO2 99 %, unknown if currently breastfeeding.  Physical Exam:  General: alert, cooperative and no distress Lochia: appropriate Uterine Fundus: firm Incision: none DVT Evaluation: No evidence of DVT seen on physical exam. No cords or calf tenderness. No significant calf/ankle edema.   Recent Labs  06/30/16 1003  HGB 10.4*  HCT 31.1*    Assessment/Plan: Plan for discharge tomorrow, Breastfeeding, Lactation consult, Social Work consult and Contraception [name]. Hx of anemia: Niferex started.    LOS: 1 day   Roe CoombsRachelle A Denney, CNM 07/01/2016, 8:07 AM

## 2016-07-01 NOTE — Lactation Note (Signed)
This note was copied from a baby's chart. Lactation Consultation Note  Patient Name: Shelby Duncan WGNFA'OToday's Date: 07/01/2016 Reason for consult: Follow-up assessment Baby at 26 hr of life. Mom requesting help with latch on the L breast. Mom prefers to use cross cradle and baby latched well. Mom stated using breast compression made latch "feel better". Reviewed nipple care and manual expression. Mom is aware of lactation services and support group. She will call as needed.    Maternal Data Has patient been taught Hand Expression?: Yes  Feeding Feeding Type: Breast Fed Length of feed: 10 min  LATCH Score/Interventions Latch: Grasps breast easily, tongue down, lips flanged, rhythmical sucking. Intervention(s): Adjust position;Breast compression  Audible Swallowing: Spontaneous and intermittent Intervention(s): Skin to skin;Hand expression;Alternate breast massage  Type of Nipple: Everted at rest and after stimulation Intervention(s): Shells  Comfort (Breast/Nipple): Filling, red/small blisters or bruises, mild/mod discomfort  Problem noted: Mild/Moderate discomfort;Cracked, bleeding, blisters, bruises Interventions  (Cracked/bleeding/bruising/blister): Expressed breast milk to nipple Interventions (Mild/moderate discomfort): Comfort gels  Hold (Positioning): Assistance needed to correctly position infant at breast and maintain latch. Intervention(s): Support Pillows;Position options  LATCH Score: 8  Lactation Tools Discussed/Used WIC Program: Yes   Consult Status Consult Status: Follow-up Date: 07/02/16 Follow-up type: In-patient    Rulon Eisenmengerlizabeth E Yiannis Tulloch 07/01/2016, 6:32 PM

## 2016-07-02 ENCOUNTER — Encounter: Payer: Self-pay | Admitting: Certified Nurse Midwife

## 2016-07-02 DIAGNOSIS — O99343 Other mental disorders complicating pregnancy, third trimester: Secondary | ICD-10-CM

## 2016-07-02 DIAGNOSIS — Z3A39 39 weeks gestation of pregnancy: Secondary | ICD-10-CM

## 2016-07-02 MED ORDER — COCONUT OIL OIL: 1.0000 "application " | TOPICAL_OIL | 99 refills | Status: DC | PRN

## 2016-07-02 MED ORDER — SENNOSIDES-DOCUSATE SODIUM 8.6-50 MG PO TABS: 2.0000 | ORAL_TABLET | ORAL | 1 refills | Status: DC

## 2016-07-02 MED ORDER — IBUPROFEN 600 MG PO TABS: 600.0000 mg | ORAL_TABLET | Freq: Four times a day (QID) | ORAL | 2 refills | Status: DC

## 2016-07-02 MED ORDER — POLYSACCHARIDE IRON COMPLEX 150 MG PO CAPS: 150.0000 mg | ORAL_CAPSULE | Freq: Every day | ORAL | 2 refills | Status: DC

## 2016-07-02 MED ORDER — OXYCODONE-ACETAMINOPHEN 5-325 MG PO TABS: 1.0000 | ORAL_TABLET | ORAL | 0 refills | Status: DC | PRN

## 2016-07-02 NOTE — Lactation Note (Signed)
This note was copied from a baby's chart. Lactation Consultation Note; Mother states that her milk came in this am. She describes fullness and slight tenderness. Mother states that infant is feeding well. She has a good report card with lots of feeding and lots of wet and dirty diapers. Mother advised to massage and ice breast to prevent severe engorgement. Mother receptive to teaching.   Patient Name: Shelby Duncan ZOXWR'UToday's Date: 07/02/2016     Maternal Data    Feeding Feeding Type: Breast Fed Length of feed: 20 min  LATCH Score/Interventions                      Lactation Tools Discussed/Used     Consult Status      Michel BickersKendrick, Beverly Suriano McCoy 07/02/2016, 11:56 AM

## 2016-07-02 NOTE — Discharge Summary (Signed)
Obstetric Discharge Summary Reason for Admission: onset of labor Prenatal Procedures: ultrasound Intrapartum Procedures: spontaneous vaginal delivery Postpartum Procedures: none Complications-Operative and Postpartum: none Hemoglobin  Date Value Ref Range Status  06/30/2016 10.4 (L) 12.0 - 15.0 g/dL Final   HCT  Date Value Ref Range Status  06/30/2016 31.1 (L) 36.0 - 46.0 % Final   Hematocrit  Date Value Ref Range Status  04/16/2016 30.5 (L) 34.0 - 46.6 % Final    Physical Exam:  General: alert, cooperative and no distress Lochia: appropriate Uterine Fundus: firm Incision: none DVT Evaluation: No evidence of DVT seen on physical exam. No cords or calf tenderness. No significant calf/ankle edema.  Discharge Diagnoses: Term Pregnancy-delivered and vanishing twin syndrome  Discharge Information: Date: 07/02/2016 Activity: pelvic rest Diet: routine Medications: PNV, Ibuprofen, Colace, Iron and Percocet Condition: stable Instructions: refer to practice specific booklet Discharge to: home Follow-up Information    Rachelle A Denney, CNM Follow up in 2 week(s).   Specialty:  Certified Nurse Midwife Contact information: [address] [place] [id number] [phone]           Newborn Data: Live born female  Birth Weight: 6 lb 14.9 oz (3145 g) APGAR: 9, 9  Home with mother.  Roe Coombsachelle A Denney, CNM 07/02/2016, 8:01 AM

## 2016-07-02 NOTE — Progress Notes (Signed)
Post Partum Day #2 Subjective: no complaints, up ad lib, voiding, tolerating PO and +BM  Objective: Blood pressure 114/69, pulse 69, temperature 97.9 F (36.6 C), temperature source Oral, resp. rate 15, height 5\' 9"  (1.753 m), weight 224 lb (101.6 kg), last menstrual period 09/28/2015, SpO2 99 %, unknown if currently breastfeeding.  Physical Exam:  General: alert, cooperative and no distress Lochia: appropriate Uterine Fundus: firm Incision: none DVT Evaluation: No evidence of DVT seen on physical exam. No cords or calf tenderness. No significant calf/ankle edema.   Recent Labs  06/30/16 1003  HGB 10.4*  HCT 31.1*    Assessment/Plan: Discharge home, Breastfeeding and Contraception [name]?   LOS: 2 days   Shelby Duncan, CNM 07/02/2016, 7:58 AM

## 2016-07-05 NOTE — Progress Notes (Signed)
[place] and was made aware that [facility]OB has an open CPS.  CSW made CPS aware that MOB delivered on 06/30/16 and refused to provided hospital information for a birth certificate for infant.  CSW filed a new CPS report with Regulatory affairs officerassessment worker, Bernie CoveyPam Miller.   Blaine HamperAngel Boyd-Gilyard, MSW, LCSW Clinical Social Work [phone]

## 2016-08-11 ENCOUNTER — Ambulatory Visit: Payer: Self-pay | Admitting: Obstetrics and Gynecology

## 2016-10-29 ENCOUNTER — Emergency Department (HOSPITAL_COMMUNITY): Payer: Medicaid Other

## 2016-10-29 ENCOUNTER — Encounter (HOSPITAL_COMMUNITY): Payer: Self-pay | Admitting: *Deleted

## 2016-10-29 ENCOUNTER — Emergency Department (HOSPITAL_COMMUNITY)
Admission: EM | Admit: 2016-10-29 | Discharge: 2016-10-29 | Disposition: A | Discharge status: Home / Self Care | Payer: Medicaid Other | Attending: Emergency Medicine | Admitting: Emergency Medicine

## 2016-10-29 DIAGNOSIS — J040 Acute laryngitis: Secondary | ICD-10-CM | POA: Diagnosis not present

## 2016-10-29 DIAGNOSIS — R1110 Vomiting, unspecified: Secondary | ICD-10-CM | POA: Insufficient documentation

## 2016-10-29 DIAGNOSIS — Z87891 Personal history of nicotine dependence: Secondary | ICD-10-CM | POA: Insufficient documentation

## 2016-10-29 DIAGNOSIS — Z79899 Other long term (current) drug therapy: Secondary | ICD-10-CM | POA: Diagnosis not present

## 2016-10-29 DIAGNOSIS — R1111 Vomiting without nausea: Secondary | ICD-10-CM

## 2016-10-29 DIAGNOSIS — J029 Acute pharyngitis, unspecified: Secondary | ICD-10-CM | POA: Diagnosis present

## 2016-10-29 LAB — RAPID STREP SCREEN (MED CTR MEBANE ONLY): STREPTOCOCCUS, GROUP A SCREEN (DIRECT): NEGATIVE

## 2016-10-29 MED ORDER — ONDANSETRON 4 MG PO TBDP: 4.0000 mg | ORAL_TABLET | Freq: Three times a day (TID) | ORAL | 0 refills | Status: DC | PRN

## 2016-10-29 NOTE — ED Provider Notes (Signed)
[facility]-EMERGENCY DEPT Provider Note   CSN: [id number] Arrival date & time: 10/29/16  1156     History   Chief Complaint Chief Complaint  Patient presents with  . Sore Throat  . Hematemesis    HPI Shelby Duncan is a 26 y.o. female.  Patient with no h/o DM, immunocompromise - presents with c/o sore throat, subjective fever, hoarse voice, cough, vomiting x 3-4 days. Patient states that she vomited 3-4 times today and notes small streaks of blood in vomit. No large volume hematemesis. Cough is productive of green sputum, no blood. No ear pain, rhinorrhea. Sore throat is minor. She is eating and drinking well without difficulty. She is moving her neck without pain. No abdominal pain, diarrhea, urinary symptoms. No treatments prior to arrival. Noted sick contacts. No weight loss, night sweats. The onset of this condition was acute. The course is constant. Aggravating factors: none. Alleviating factors: none.         Past Medical History:  Diagnosis Date  . Depression   . Infection    UTI  . OCD (obsessive compulsive disorder)   . PTSD (post-traumatic stress disorder)     Patient Active Problem List   Diagnosis Date Noted  . Pregnancy in multigravida 06/30/2016  . Supervision of normal pregnancy, antepartum 06/11/2016  . 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 Twin pregnancy w antenatal problem 12/11/2015  . Depression 09/24/2013  . Schizophrenia (HCC) 09/24/2013    Past Surgical History:  Procedure Laterality Date  . NO PAST SURGERIES      OB History    Gravida Para Term Preterm AB Living   3 2 2  0 1 2   SAB TAB Ectopic Multiple Live Births   1 0 0 0 2       Home Medications    Prior to Admission medications   Medication Sig Start Date End Date Taking? Authorizing Provider  coconut oil OIL Apply 1 application topically as needed. 07/02/16   Rachelle A Denney, CNM  ferrous sulfate 325 (65 FE) MG tablet Take 1 tablet (325 mg total) by mouth 2 (two) times  daily. Patient not taking: Reported on 06/17/2016 05/23/16   Judeth Horn, NP  ibuprofen (ADVIL,MOTRIN) 600 MG tablet Take 1 tablet (600 mg total) by mouth every 6 (six) hours. 07/02/16   Rachelle A Denney, CNM  iron polysaccharides (NIFEREX) 150 MG capsule Take 1 capsule (150 mg total) by mouth daily. 07/02/16   Rachelle A Denney, CNM  oxyCODONE-acetaminophen (ROXICET) 5-325 MG tablet Take 1-2 tablets by mouth every 4 (four) hours as needed for severe pain. 07/02/16   Rachelle A Denney, CNM  Prenat-FeAsp-Meth-FA-DHA w/o A (PRENATE PIXIE) 10-0.6-0.4-200 MG CAPS Take 1 capsule by mouth daily before breakfast. 03/05/16   Brock Bad, MD  senna-docusate (SENOKOT-S) 8.6-50 MG tablet Take 2 tablets by mouth daily. 07/03/16   Roe Coombs, CNM    Family History Family History  Problem Relation Age of Onset  . Cancer Maternal Grandmother     breast  . Cancer Paternal Grandfather     Social History Social History  Substance Use Topics  . Smoking status: Former Smoker    Types: Cigarettes    Quit date: 08/18/2006  . Smokeless tobacco: Never Used     Comment: age 41  . Alcohol use No     Allergies   Chocolate; Food; and Metrogel [metronidazole]   Review of Systems Review of Systems  Constitutional: Positive for fever.  HENT: Positive for sore throat  and voice change. Negative for congestion, ear pain and rhinorrhea.   Eyes: Negative for redness.  Respiratory: Positive for cough. Negative for shortness of breath.   Cardiovascular: Negative for chest pain.  Gastrointestinal: Positive for nausea and vomiting. Negative for abdominal pain and diarrhea.  Genitourinary: Negative for dysuria.  Musculoskeletal: Negative for myalgias.  Skin: Negative for rash.  Neurological: Negative for headaches.     Physical Exam Updated Vital Signs BP 113/66 (BP Location: Left Arm)   Pulse 78   Temp 98.1 F (36.7 C) (Oral)   Resp 18   SpO2 100%   Physical Exam  Constitutional: She appears  well-developed and well-nourished.  HENT:  Head: Normocephalic and atraumatic.  Right Ear: Tympanic membrane, external ear and ear canal normal.  Left Ear: Tympanic membrane, external ear and ear canal normal.  Nose: Mucosal edema present. No rhinorrhea.  Mouth/Throat: Uvula is midline and mucous membranes are normal. Mucous membranes are not dry. No oral lesions. No trismus in the jaw. No uvula swelling. Posterior oropharyngeal erythema present. No oropharyngeal exudate, posterior oropharyngeal edema or tonsillar abscesses.  No trismus.  Eyes: Conjunctivae are normal. Right eye exhibits no discharge. Left eye exhibits no discharge.  Neck: Normal range of motion. Neck supple. No tracheal deviation present.  Full range of motion of neck without difficulty. Hoarse voice.  Cardiovascular: Normal rate, regular rhythm and normal heart sounds.   No murmur heard. Pulmonary/Chest: Effort normal and breath sounds normal. No respiratory distress. She has no wheezes. She has no rales.  Abdominal: Soft. There is no tenderness. There is no rebound and no guarding.  Lymphadenopathy:    She has no cervical adenopathy.  Neurological: She is alert.  Skin: Skin is warm and dry.  Psychiatric: She has a normal mood and affect.  Nursing note and vitals reviewed.    ED Treatments / Results  Labs (all labs ordered are listed, but only abnormal results are displayed) Labs Reviewed  RAPID STREP SCREEN (NOT AT [facility])  CULTURE, GROUP A STREP [facility])    Radiology Dg Chest 2 View  Result Date: 10/29/2016 CLINICAL DATA:  Sore throat.  Two days of fever. EXAM: CHEST  2 VIEW COMPARISON:  None. FINDINGS: The heart size and mediastinal contours are within normal limits. Both lungs are clear. The visualized skeletal structures are unremarkable. IMPRESSION: No active cardiopulmonary disease. Electronically Signed   By: Gerome Sam III M.D   On: 10/29/2016 15:45    Procedures Procedures (including critical care  time)  Medications Ordered in ED Medications - No data to display   Initial Impression / Assessment and Plan / ED Course  I have reviewed the triage vital signs and the nursing notes.  Pertinent labs & imaging results that were available during my care of the patient were reviewed by me and considered in my medical decision making (see chart for details).     Patient seen and examined. No immediate concern for deep space infection in neck, epiglottitis given exam and history. No concern for large GI bleed. 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 Will obtain CXR and strep swab. Work-up initiated. .   Vital signs reviewed and are as follows: BP 113/66 (BP Location: Left Arm)   Pulse 78   Temp 98.1 F (36.7 C) (Oral)   Resp 18   SpO2 100%   4:47 PM patient updated on x-ray and strep test results.  Encouraged return to emergency department with worsening pain or pressure in her neck, difficulty breathing or  swallowing, high persistent fever, persistent vomiting, new symptoms or other concerns. She verbalizes understanding and agrees with plan.  Will give Zofran for home. Encouraged use of warm liquids and any over-the-counter medications she wishes to help her symptoms. Encouraged rest.  Final Clinical Impressions(s) / ED Diagnoses   Final diagnoses:  Laryngitis  Vomiting without nausea, intractability of vomiting not specified, unspecified vomiting type   Patient with sore throat and voice change. No trismus or decreased range of motion of neck to suggest deep space infection or epiglottitis. Strep test is negative. Suspect viral laryngitis. Patient reportedly had some episodes of vomiting earlier today with streaks of blood. 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 Patient without any vomiting or significant nausea here. Zofran for home for symptom control. Abdomen is soft and nontender. Chest x-ray is clear without any free air.  New Prescriptions New Prescriptions   ONDANSETRON (ZOFRAN  ODT) 4 MG DISINTEGRATING TABLET    Take 1 tablet (4 mg total) by mouth every 8 (eight) hours as needed for nausea or vomiting.     Renne CriglerJoshua Gerell Fortson, PA-C 10/29/16 1648    Gwyneth SproutWhitney Plunkett, MD 10/29/16 [phone]

## 2016-10-29 NOTE — ED Triage Notes (Signed)
Pt reports having sore throat x 2 days with fever. Pt reports feeling like her throat is swelling. Pt having hoarseness, is able to swallow and airway is intact. She reports spitting up and vomiting blood today. Denies night sweats or weight loss. Mask on pt at triage.

## 2016-10-29 NOTE — Discharge Instructions (Signed)
Please read and follow all provided instructions.  Your diagnoses today include:  1. Laryngitis   2. Vomiting without nausea, intractability of vomiting not specified, unspecified vomiting type    You appear to have an upper respiratory infection (URI). An upper respiratory tract infection, or cold, is a viral infection of the air passages leading to the lungs. It should improve gradually after 5-7 days. You may have a lingering cough that lasts for 2- 4 weeks after the infection.  Tests performed today include:  Vital signs. See below for your results today.   Medications prescribed:   Zofran (ondansetron) - for nausea and vomiting  Take any prescribed medications only as directed. Treatment for your infection is aimed at treating the symptoms. There are no medications, such as antibiotics, that will cure your infection.   Home care instructions:  Follow any educational materials contained in this packet.   Your illness is contagious and can be spread to others, especially during the first 3 or 4 days. It cannot be cured by antibiotics or other medicines. Take basic precautions such as washing your hands often, covering your mouth when you cough or sneeze, and avoiding public places where you could spread your illness to others.   Please continue drinking plenty of fluids.  Use over-the-counter medicines as needed as directed on packaging for symptom relief.  You may also use ibuprofen or tylenol as directed on packaging for pain or fever.  Do not take multiple medicines containing Tylenol or acetaminophen to avoid taking too much of this medication.  Follow-up instructions: Please follow-up with your primary care provider in the next 3 days for further evaluation of your symptoms if you are not feeling better.   Return instructions:   Please return to the Emergency Department if you experience worsening symptoms.   RETURN IMMEDIATELY IF you develop shortness of breath, confusion or  altered mental status, a new rash, become dizzy, faint, or poorly responsive, or are unable to be cared for at home.  Please return if you have persistent vomiting and cannot keep down fluids or develop a fever that is not controlled by tylenol or motrin.    Return with worsening pain or pressure in your throat to the point where you are having trouble breathing or swallowing.   Please return if you have any other emergent concerns.  Additional Information:  Your vital signs today were: BP 113/66 (BP Location: Left Arm)    Pulse 78    Temp 98.1 F (36.7 C) (Oral)    Resp 18    SpO2 100%  If your blood pressure (BP) was elevated above 135/85 this visit, please have this repeated by your doctor within one month. --------------

## 2016-10-31 LAB — CULTURE, GROUP A STREP (THRC)

## 2017-02-08 ENCOUNTER — Ambulatory Visit: Payer: Self-pay | Admitting: Obstetrics

## 2017-02-23 ENCOUNTER — Ambulatory Visit: Payer: Self-pay | Admitting: Obstetrics

## 2017-06-23 ENCOUNTER — Emergency Department (HOSPITAL_COMMUNITY): Payer: Medicaid Other

## 2017-06-23 ENCOUNTER — Encounter (HOSPITAL_COMMUNITY): Payer: Self-pay | Admitting: Emergency Medicine

## 2017-06-23 ENCOUNTER — Emergency Department (HOSPITAL_COMMUNITY)
Admission: EM | Admit: 2017-06-23 | Discharge: 2017-06-23 | Disposition: A | Discharge status: Home / Self Care | Payer: Medicaid Other | Source: Home / Self Care

## 2017-06-23 DIAGNOSIS — Y929 Unspecified place or not applicable: Secondary | ICD-10-CM | POA: Diagnosis not present

## 2017-06-23 DIAGNOSIS — R51 Headache: Secondary | ICD-10-CM

## 2017-06-23 DIAGNOSIS — Y999 Unspecified external cause status: Secondary | ICD-10-CM | POA: Insufficient documentation

## 2017-06-23 DIAGNOSIS — S0992XA Unspecified injury of nose, initial encounter: Secondary | ICD-10-CM | POA: Diagnosis present

## 2017-06-23 DIAGNOSIS — S022XXA Fracture of nasal bones, initial encounter for closed fracture: Secondary | ICD-10-CM | POA: Diagnosis not present

## 2017-06-23 DIAGNOSIS — Z87891 Personal history of nicotine dependence: Secondary | ICD-10-CM | POA: Insufficient documentation

## 2017-06-23 DIAGNOSIS — Y9389 Activity, other specified: Secondary | ICD-10-CM | POA: Diagnosis not present

## 2017-06-23 DIAGNOSIS — Z5321 Procedure and treatment not carried out due to patient leaving prior to being seen by health care provider: Secondary | ICD-10-CM | POA: Insufficient documentation

## 2017-06-23 DIAGNOSIS — W228XXA Striking against or struck by other objects, initial encounter: Secondary | ICD-10-CM | POA: Insufficient documentation

## 2017-06-23 LAB — BASIC METABOLIC PANEL
ANION GAP: 7 (ref 5–15)
BUN: 9 mg/dL (ref 6–20)
CHLORIDE: 107 mmol/L (ref 101–111)
CO2: 24 mmol/L (ref 22–32)
CREATININE: 0.64 mg/dL (ref 0.44–1.00)
Calcium: 9 mg/dL (ref 8.9–10.3)
GFR calc Af Amer: >60 mL/min (ref >60)
GFR calc non Af Amer: >60 mL/min (ref >60)
GLUCOSE: 93 mg/dL (ref 65–99)
Potassium: 3.6 mmol/L (ref 3.5–5.1)
Sodium: 138 mmol/L (ref 135–145)

## 2017-06-23 LAB — CBC WITH DIFFERENTIAL/PLATELET
Basophils Absolute: 0 10*3/uL (ref 0.0–0.1)
Basophils Relative: 0 %
Eosinophils Absolute: 0.3 10*3/uL (ref 0.0–0.7)
Eosinophils Relative: 2 %
HEMATOCRIT: 33.5 % — AB (ref 36.0–46.0)
Hemoglobin: 10.5 g/dL — ABNORMAL LOW (ref 12.0–15.0)
LYMPHS PCT: 25 %
Lymphs Abs: 3.1 10*3/uL (ref 0.7–4.0)
MCH: 24.5 pg — ABNORMAL LOW (ref 26.0–34.0)
MCHC: 31.3 g/dL (ref 30.0–36.0)
MCV: 78.3 fL (ref 78.0–100.0)
MONO ABS: 0.8 10*3/uL (ref 0.1–1.0)
MONOS PCT: 6 %
NEUTROS ABS: 8.2 10*3/uL — AB (ref 1.7–7.7)
Neutrophils Relative %: 67 %
Platelets: 449 10*3/uL — ABNORMAL HIGH (ref 150–400)
RBC: 4.28 MIL/uL (ref 3.87–5.11)
RDW: 15.6 % — AB (ref 11.5–15.5)
WBC: 12.4 10*3/uL — ABNORMAL HIGH (ref 4.0–10.5)

## 2017-06-23 LAB — I-STAT BETA HCG BLOOD, ED (MC, WL, AP ONLY): I-stat hCG, quantitative: <5 m[IU]/mL (ref <5)

## 2017-06-23 NOTE — ED Notes (Signed)
Called again, but still no response.

## 2017-06-23 NOTE — ED Notes (Signed)
Dr. Corlis Leak notified on pt.'s condition , she ordered CT head and CT maxillofacial.

## 2017-06-23 NOTE — ED Triage Notes (Signed)
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 Denies LOC/ambulatory , respirations unlabored .

## 2017-06-23 NOTE — ED Notes (Signed)
Called pt to be triaged, but no response from the waiting room.  

## 2017-06-24 ENCOUNTER — Emergency Department (HOSPITAL_COMMUNITY)
Admission: EM | Admit: 2017-06-24 | Discharge: 2017-06-24 | Disposition: A | Discharge status: Home / Self Care | Payer: Medicaid Other | Attending: Emergency Medicine | Admitting: Emergency Medicine

## 2017-06-24 DIAGNOSIS — S022XXA Fracture of nasal bones, initial encounter for closed fracture: Secondary | ICD-10-CM

## 2017-06-24 MED ORDER — TRAMADOL HCL 50 MG PO TABS: 50.0000 mg | ORAL_TABLET | Freq: Four times a day (QID) | ORAL | 0 refills | Status: DC | PRN

## 2017-06-24 NOTE — ED Provider Notes (Signed)
[facility]-EMERGENCY DEPT Provider Note   CSN: [id number] Arrival date & time: 06/23/17  2148     History   Chief Complaint Chief Complaint  Patient presents with  . Facial Injury    HPI Shelby Duncan is a 26 y.o. female.  Patient presents to the emergency department for evaluation of facial injury. Patient reports that she dropped a heavy box while loading a truck and hit her in the face. No loss of consciousness. Patient playing of pain and swelling of the nose and face as well as headache.      Past Medical History:  Diagnosis Date  . Depression   . Infection    UTI  . OCD (obsessive compulsive disorder)   . PTSD (post-traumatic stress disorder)     Patient Active Problem List   Diagnosis Date Noted  . Pregnancy in multigravida 06/30/2016  . Supervision of normal pregnancy, antepartum 06/11/2016  . 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 Twin pregnancy w antenatal problem 12/11/2015  . Depression 09/24/2013  . Schizophrenia (HCC) 09/24/2013    Past Surgical History:  Procedure Laterality Date  . NO PAST SURGERIES      OB History    Gravida Para Term Preterm AB Living   0 1 2   SAB TAB Ectopic Multiple Live Births   1 0 0 0 2       Home Medications    Prior to Admission medications   Medication Sig Start Date End Date Taking? Authorizing Provider  ondansetron (ZOFRAN ODT) 4 MG disintegrating tablet Take 1 tablet (4 mg total) by mouth every 8 (eight) hours as needed for nausea or vomiting. 10/29/16   Renne Crigler, PA-C  traMADol (ULTRAM) 50 MG tablet Take 1 tablet (50 mg total) by mouth every 6 (six) hours as needed. 06/24/17   Gilda Crease, MD    Family History Family History  Problem Relation Age of Onset  . Cancer Maternal Grandmother        breast  . Cancer Paternal Grandfather     Social History Social History  Substance Use Topics  . Smoking status: Former Smoker    Types: Cigarettes    Quit date: 08/18/2006  . Smokeless  tobacco: Never Used     Comment: age 70  . Alcohol use No     Allergies   Chocolate; Food; and Metrogel [metronidazole]   Review of Systems Review of Systems  HENT: Positive for facial swelling.   All other systems reviewed and are negative.    Physical Exam Updated Vital Signs BP 110/71   Pulse 70   Temp 98.2 F (36.8 C) (Oral)   Resp 16   Ht  (1.753 m)   Wt 95.3 kg (210 lb)   LMP 06/09/2017 (Approximate)   SpO2 100%   BMI 31.01 kg/m   Physical Exam  Constitutional: She is oriented to person, place, and time. She appears well-developed and well-nourished. No distress.  HENT:  Head: Normocephalic. Head is with contusion (nose).  Right Ear: Hearing normal.  Left Ear: Hearing normal.  Nose: Nose normal.  Mouth/Throat: Oropharynx is clear and moist and mucous membranes are normal.  Eyes: Pupils are equal, round, and reactive to light. Conjunctivae and EOM are normal.  Neck: Normal range of motion. Neck supple. No spinous process tenderness and no muscular tenderness present.  Cardiovascular: Regular rhythm, S1 normal and S2 normal.  Exam reveals no gallop and no friction rub.   No murmur heard. Pulmonary/Chest:  Effort normal and breath sounds normal. No respiratory distress. She exhibits no tenderness.  Abdominal: Soft. Normal appearance and bowel sounds are normal. There is no hepatosplenomegaly. There is no tenderness. There is no rebound, no guarding, no tenderness at McBurney's point and negative Murphy's sign. No hernia.  Musculoskeletal: Normal range of motion.  Neurological: She is alert and oriented to person, place, and time. She has normal strength. No cranial nerve deficit or sensory deficit. Coordination normal. GCS eye subscore is 4. GCS verbal subscore is 5. GCS motor subscore is 6.  Skin: Skin is warm and dry. Abrasion noted. No rash noted. No cyanosis.  Psychiatric: She has a normal mood and affect. Her speech is normal and behavior is normal. Thought  content normal.  Nursing note and vitals reviewed.    ED Treatments / Results  Labs (all labs ordered are listed, but only abnormal results are displayed) Labs Reviewed  CBC WITH DIFFERENTIAL/PLATELET - Abnormal; Notable for the following:       Result Value   WBC 12.4 (*)    Hemoglobin 10.5 (*)    HCT 33.5 (*)    MCH 24.5 (*)    RDW 15.6 (*)    Platelets 449 (*)    Neutro Abs 8.2 (*)    All other components within normal limits  BASIC METABOLIC PANEL  I-STAT BETA HCG BLOOD, ED ([facility], [facility], [facility] ONLY)    EKG  EKG Interpretation None       Radiology Ct Head Wo Contrast  Result Date: 06/23/2017 CLINICAL DATA:  Hit in face by lawnmower blade abrasion to the nose EXAM: CT HEAD WITHOUT CONTRAST CT MAXILLOFACIAL WITHOUT CONTRAST TECHNIQUE: Multidetector CT imaging of the head and maxillofacial structures were performed using the standard protocol without intravenous contrast. Multiplanar CT image reconstructions of the maxillofacial structures were also generated. COMPARISON:  None. FINDINGS: CT HEAD FINDINGS Brain: No acute territorial infarction, hemorrhage or intracranial mass is visualized. The ventricles are nonenlarged. Vascular: No hyperdense vessels.  No unexpected calcification Skull: No depressed skull fracture Other: Moderate soft tissue swelling over the forehead. CT MAXILLOFACIAL FINDINGS Osseous: Bilateral mandibular heads are normally position. No mandibular fracture. Zygomatic arches and pterygoid plates are intact. Acute mildly depressed bilateral nasal bone fracture. Orbits: Negative. No traumatic or inflammatory finding. Sinuses: Opacifications in the anterior ethmoid and frontal sinuses. Mucosal thickening in the maxillary sinuses. No sinus wall fracture Soft tissues: Moderate forehead soft tissue swelling that extends over the nasal area. IMPRESSION: 1. No CT evidence for acute intracranial abnormality. 2. Acute mildly depressed bilateral nasal bone fracture. 3. Moderate  forehead soft tissue hematoma Electronically Signed   By: Jasmine Pang M.D.   On: 06/23/2017 23:52   Ct Maxillofacial Wo Contrast  Result Date: 06/23/2017 CLINICAL DATA:  Hit in face by lawnmower blade abrasion to the nose EXAM: CT HEAD WITHOUT CONTRAST CT MAXILLOFACIAL WITHOUT CONTRAST TECHNIQUE: Multidetector CT imaging of the head and maxillofacial structures were performed using the standard protocol without intravenous contrast. Multiplanar CT image reconstructions of the maxillofacial structures were also generated. COMPARISON:  None. FINDINGS: CT HEAD FINDINGS Brain: No acute territorial infarction, hemorrhage or intracranial mass is visualized. The ventricles are nonenlarged. Vascular: No hyperdense vessels.  No unexpected calcification Skull: No depressed skull fracture Other: Moderate soft tissue swelling over the forehead. CT MAXILLOFACIAL FINDINGS Osseous: Bilateral mandibular heads are normally position. No mandibular fracture. Zygomatic arches and pterygoid plates are intact. Acute mildly depressed bilateral nasal bone fracture. Orbits: Negative. No traumatic or inflammatory  finding. Sinuses: Opacifications in the anterior ethmoid and frontal sinuses. Mucosal thickening in the maxillary sinuses. No sinus wall fracture Soft tissues: Moderate forehead soft tissue swelling that extends over the nasal area. IMPRESSION: 1. No CT evidence for acute intracranial abnormality. 2. Acute mildly depressed bilateral nasal bone fracture. 3. Moderate forehead soft tissue hematoma Electronically Signed   By: Jasmine Pang M.D.   On: 06/23/2017 23:52    Procedures Procedures (including critical care time)  Medications Ordered in ED Medications - No data to display   Initial Impression / Assessment and Plan / ED Course  I have reviewed the triage vital signs and the nursing notes.  Pertinent labs & imaging results that were available during my care of the patient were reviewed by me and considered in  my medical decision making (see chart for details).     Presents to the emergency department for evaluation of facial trauma. Patient has pain, swelling over the bridge of the nose. CT scan does show evidence of nasal fracture minimally displaced. No septal hematoma. Remainder of the images were unremarkable. Patient discharged with analgesia, follow up with ENT as needed.  Final Clinical Impressions(s) / ED Diagnoses   Final diagnoses:  Closed fracture of nasal bone, initial encounter    New Prescriptions New Prescriptions   TRAMADOL (ULTRAM) 50 MG TABLET    Take 1 tablet (50 mg total) by mouth every 6 (six) hours as needed.     Gilda Crease, MD 06/24/17 [phone]

## 2017-12-11 ENCOUNTER — Emergency Department (HOSPITAL_COMMUNITY)
Admission: EM | Admit: 2017-12-11 | Discharge: 2017-12-11 | Disposition: A | Discharge status: Home / Self Care | Payer: Medicaid Other | Attending: Emergency Medicine | Admitting: Emergency Medicine

## 2017-12-11 ENCOUNTER — Other Ambulatory Visit: Payer: Self-pay

## 2017-12-11 ENCOUNTER — Encounter (HOSPITAL_COMMUNITY): Payer: Self-pay

## 2017-12-11 DIAGNOSIS — Z87891 Personal history of nicotine dependence: Secondary | ICD-10-CM | POA: Insufficient documentation

## 2017-12-11 DIAGNOSIS — K0889 Other specified disorders of teeth and supporting structures: Secondary | ICD-10-CM | POA: Diagnosis present

## 2017-12-11 DIAGNOSIS — K029 Dental caries, unspecified: Secondary | ICD-10-CM | POA: Diagnosis not present

## 2017-12-11 MED ORDER — MELOXICAM 15 MG PO TABS: 15.0000 mg | ORAL_TABLET | Freq: Every day | ORAL | 0 refills | Status: DC

## 2017-12-11 MED ORDER — AMOXICILLIN 500 MG PO CAPS: 500.0000 mg | ORAL_CAPSULE | Freq: Three times a day (TID) | ORAL | 0 refills | Status: DC

## 2017-12-11 NOTE — Discharge Instructions (Signed)
[employer]  °[employer]  °[facility]  °[address]  °[place], [place] [address]  °Phone [phone]  °The [employer] [facility] in [place], [place], exemplifies the Dental School’s vision to improve the health and quality of life of all [place]ians by creating leaders with a passion to care for the underserved and by leading the nation in community-based, service learning oral health education. °We are committed to offering comprehensive general dental services for adults, children and special needs patients in a safe, caring and professional setting. ° °Appointments: Our clinic is open Monday through Friday 8:00 a.m. until 5:00 p.m. The amount of time scheduled for an appointment depends on the patient’s specific needs. We ask that you keep your appointed time for care or provide 24-hour notice of all appointment changes. Parents or legal guardians must accompany minor children. ° °Payment for Services: Medicaid and other insurance plans are welcome. Payment for services is due when services are rendered and may be made by cash or credit card. If you have dental insurance, we will assist you with your claim submission.  ° °Emergencies: Emergency services will be provided Monday through Friday on a walk-in basis. Please arrive early for emergency services. After hours emergency services will be provided for patients of record as required. ° °Services:  °Comprehensive General Dentistry  °Children’s Dentistry  °Oral Surgery - Extractions  °Root Canals  °Sealants and Tooth Colored Fillings  °Crowns and Bridges  °Dentures and Partial Dentures  °Implant Services  °Periodontal Services and Cleanings  °Cosmetic Tooth Whitening  °Digital Radiography  °3-D/[facility] Beam Imaging ° ° °

## 2017-12-11 NOTE — ED Provider Notes (Signed)
[facility]   CSN: [id number] Arrival date & time: 12/11/17  [id number]     History   Chief Complaint Chief Complaint  Patient presents with  . Dental Pain    HPI Kandice Moosakiesha N Agyeman is a 27 y.o. female.  Who presents emergency department chief complaint of left upper tooth pain.  She states that she has a fractured tooth in her left  upper wisdom tooth.  It has been present for more than a year.  She has had pain off and on.  She states that yesterday it started giving her some pain and she came in this morning.  She denies difficulty swallowing, fever, change in voice.  She has not tried anything for the pain today.  She had vanishing twin syndrome with  HPI  Past Medical History:  Diagnosis Date  . Depression   . Infection    UTI  . OCD (obsessive compulsive disorder)   . PTSD (post-traumatic stress disorder)     Patient Active Problem List   Diagnosis Date Noted  . Pregnancy in multigravida 06/30/2016  . Supervision of normal pregnancy, antepartum 06/11/2016  . 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 Twin pregnancy w antenatal problem 12/11/2015  . Depression 09/24/2013  . Schizophrenia (HCC) 09/24/2013    Past Surgical History:  Procedure Laterality Date  . NO PAST SURGERIES      OB History    Gravida Para Term Preterm AB Living   3 2 2  0 1 2   SAB TAB Ectopic Multiple Live Births   1 0 0 0 2       Home Medications    Prior to Admission medications   Medication Sig Start Date End Date Taking? Authorizing Provider  amoxicillin (AMOXIL) 500 MG capsule Take 1 capsule (500 mg total) by mouth 3 (three) times daily. 12/11/17   Arthor CaptainHarris, Onie Kasparek, PA-C  meloxicam (MOBIC) 15 MG tablet Take 1 tablet (15 mg total) by mouth daily. Take 1 daily with food. 12/11/17   Gao Mitnick, PA-C  ondansetron (ZOFRAN ODT) 4 MG disintegrating tablet Take 1 tablet (4 mg total) by mouth every 8 (eight) hours as needed for nausea or  vomiting. 10/29/16   Renne CriglerGeiple, Joshua, PA-C  traMADol (ULTRAM) 50 MG tablet Take 1 tablet (50 mg total) by mouth every 6 (six) hours as needed. 06/24/17   Gilda CreasePollina, Christopher J, MD    Family History Family History  Problem Relation Age of Onset  . Cancer Maternal Grandmother        breast  . Cancer Paternal Grandfather     Social History Social History   Tobacco Use  . Smoking status: Former Smoker    Types: Cigarettes    Last attempt to quit: 08/18/2006    Years since quitting: 11.3  . Smokeless tobacco: Never Used  . Tobacco comment: age 27  Substance Use Topics  . Alcohol use: No  . Drug use: No     Allergies   Chocolate; Food; and Metrogel [metronidazole]   Review of Systems Review of Systems  Positive for dental pain, negative for change in phonation, fevers, difficulty swallowing Physical Exam Updated Vital Signs BP (!) 143/81 (BP Location: Right Arm)   Pulse 78   Temp (!) 97.1 F (36.2 C) (Oral)   Resp 20   SpO2 100%   Physical Exam  Constitutional: She is oriented to person, place, and time. She appears well-developed and well-nourished. No distress.  HENT:  Head: Normocephalic and  atraumatic.  Left upper third molar is cracked.  No overt abscess, tender to palpation.  Eyes: Conjunctivae are normal. No scleral icterus.  Neck: Normal range of motion.  Cardiovascular: Normal rate, regular rhythm and normal heart sounds. Exam reveals no gallop and no friction rub.  No murmur heard. Pulmonary/Chest: Effort normal and breath sounds normal. No respiratory distress.  Abdominal: Soft. Bowel sounds are normal. She exhibits no distension and no mass. There is no tenderness. There is no guarding.  Neurological: She is alert and oriented to person, place, and time.  Skin: Skin is warm and dry. She is not diaphoretic.  Psychiatric: Her behavior is normal.  Nursing note and vitals reviewed.  Is a body like resort  ED Treatments / Results  Labs (all labs ordered  are listed, but only abnormal results are displayed) Labs Reviewed - No data to display  EKG  EKG Interpretation None       Radiology No results found.  Procedures Procedures (including critical care time)  Medications Ordered in ED Medications - No data to display   Initial Impression / Assessment and Plan / ED Course  I have reviewed the triage vital signs and the nursing notes.  Pertinent labs & imaging results that were available during my care of the patient were reviewed by me and considered in my medical decision making (see chart for details).     Patient with toothache.  No gross abscess.  Exam unconcerning for Ludwig's angina or spread of infection.  Will treat with penicillin and pain medicine.  Urged patient to follow-up with dentist.     Final Clinical Impressions(s) / ED Diagnoses   Final diagnoses:  Pain due to dental caries    ED Discharge Orders        Ordered    meloxicam (MOBIC) 15 MG tablet  Daily     12/11/17 1321    amoxicillin (AMOXIL) 500 MG capsule  3 times daily     12/11/17 1321       Arthor Captain, PA-C 12/11/17 1322    Raeford Razor, MD 12/11/17 [phone]

## 2017-12-11 NOTE — ED Triage Notes (Signed)
Pt reports left upper dental pain x several weeks that is starting to cause headache. Denies having dentist

## 2018-04-12 IMAGING — US US MFM OB FOLLOW-UP
1 series · 14 of 28 positions shown · non-contrast
Comparison: none

[Series 1: us mfm ob follow-up · 14 of 35 slices shown]
[im 2/35]
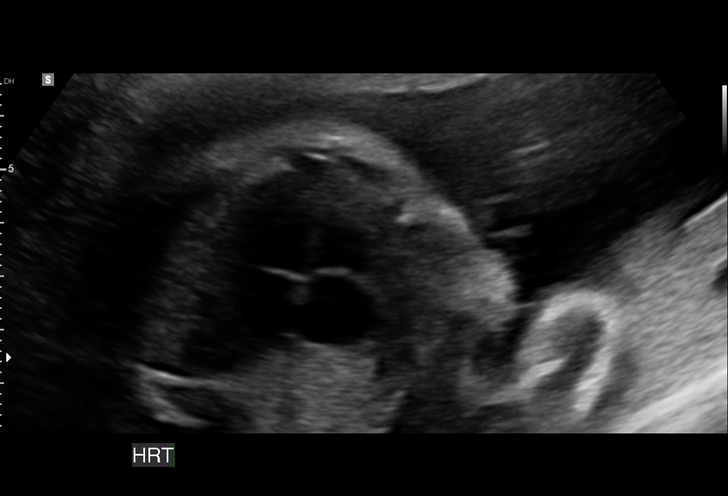
[im 4/35]
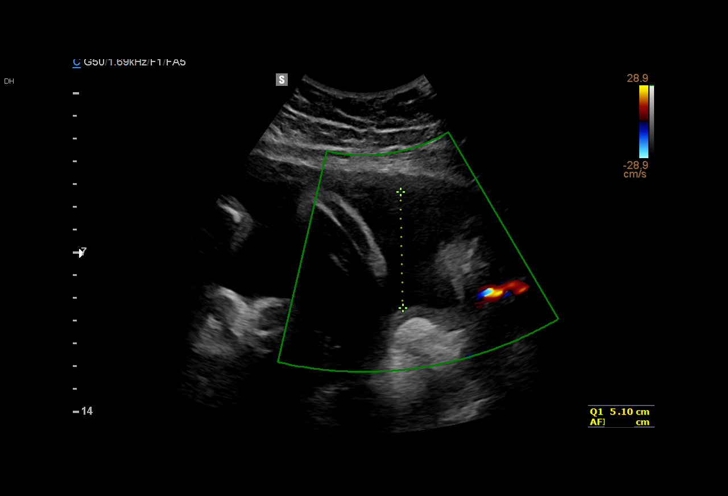
[im 7/35]
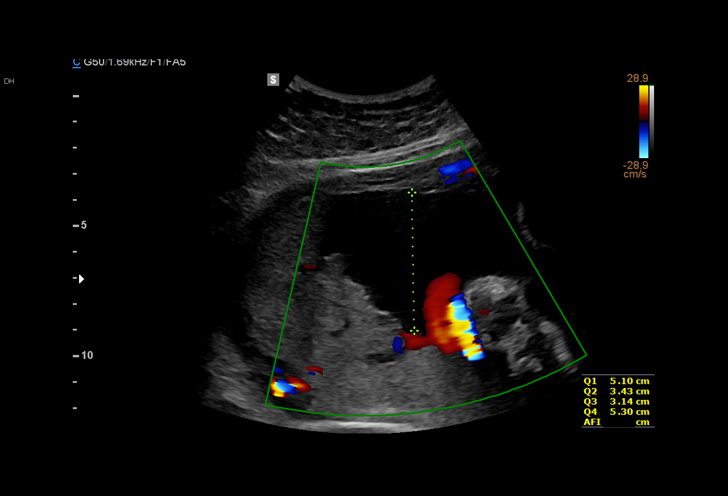
[im 9/35]
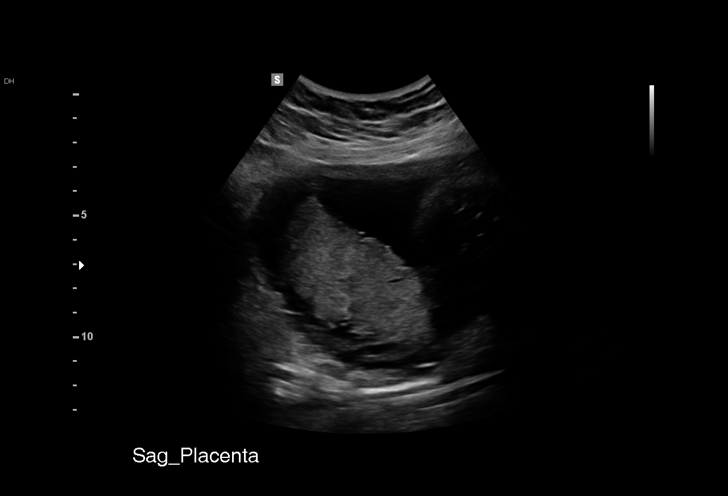
[im 12/35]
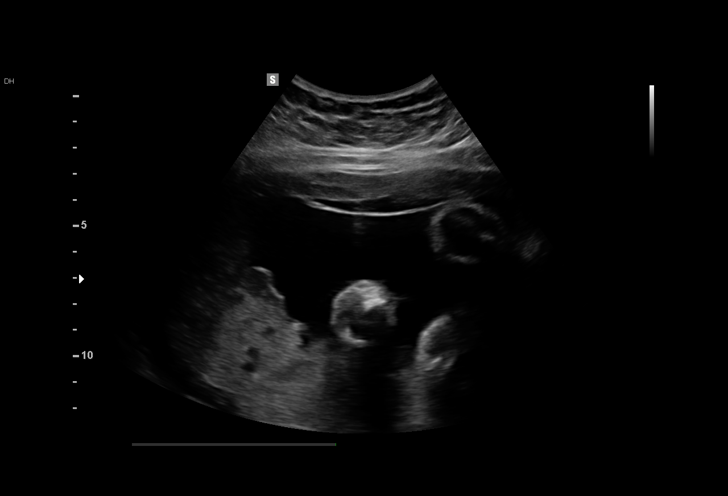
[im 14/35]
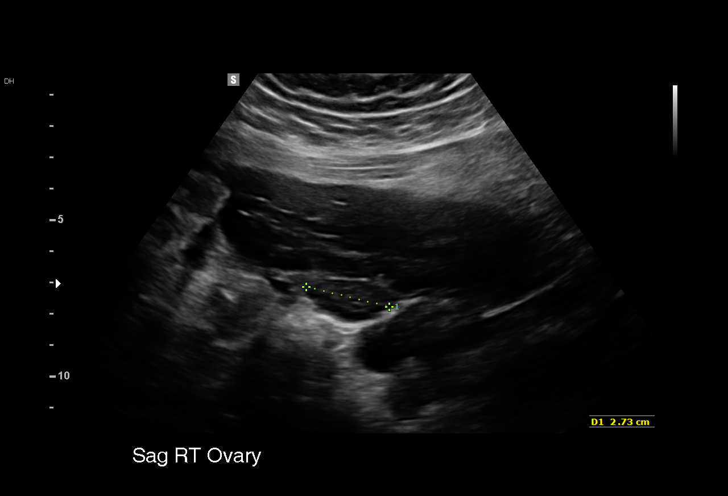
[im 17/35]
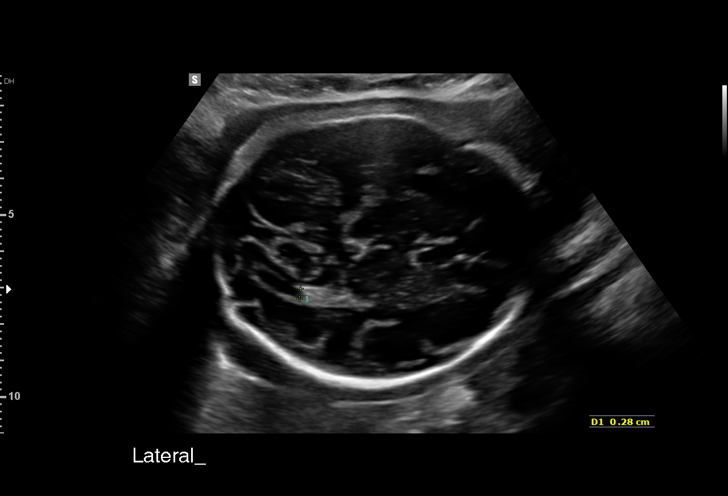
[im 19/35]
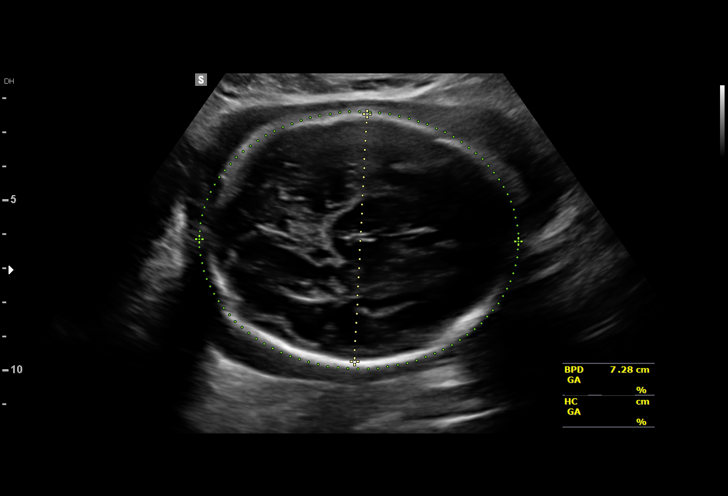
[im 22/35]
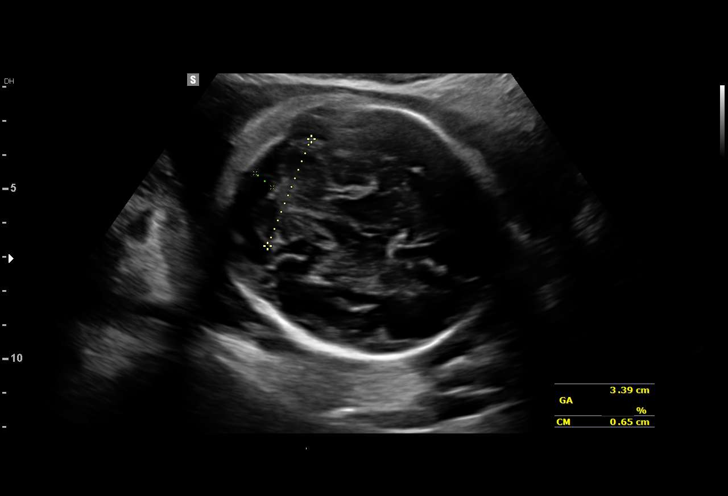
[im 24/35]
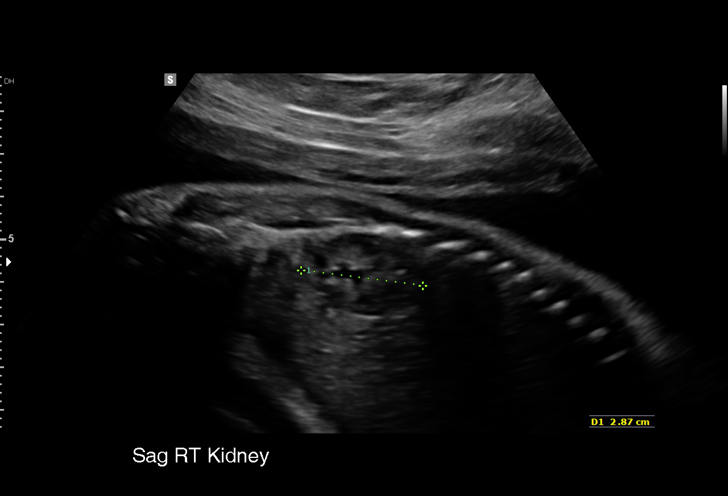
[im 27/35]
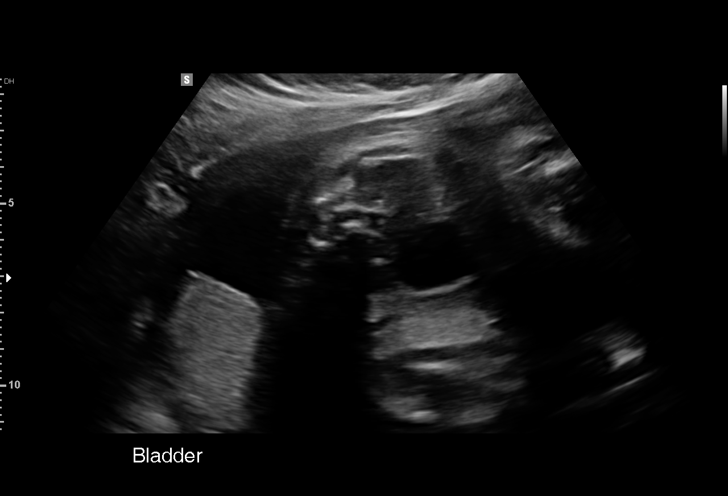
[im 29/35]
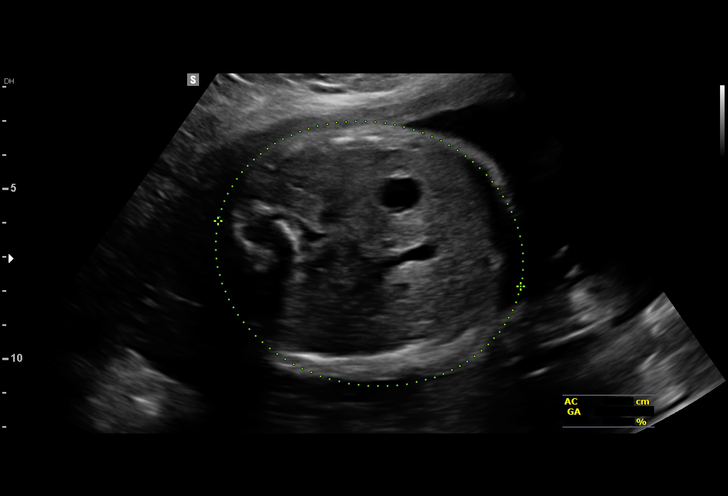
[im 32/35]
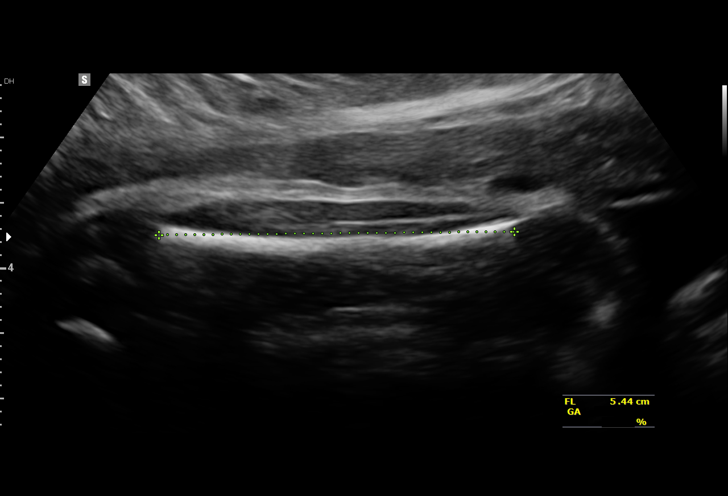
[im 35/35]
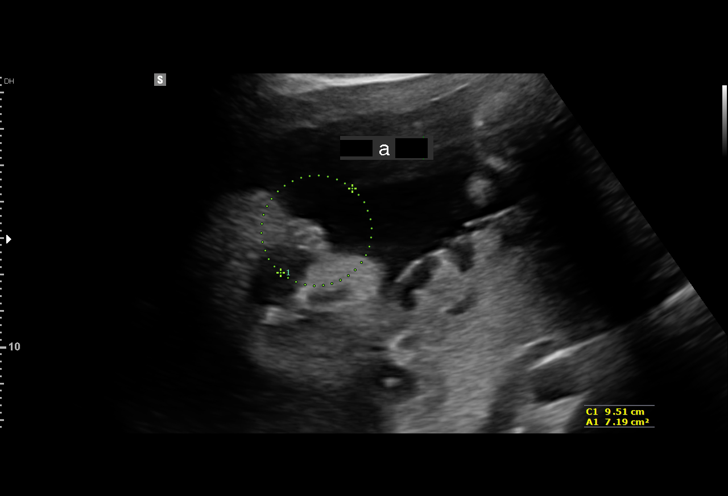

[14 of 28 positions shown; findings below may reference images not displayed]

Road [HOSPITAL]

Indications

29 weeks gestation of pregnancy
Twin pregnancy with loss (demise) of one       O30.009,
fetus, antepartum
Other mental disorder complicating
pregnancy, third trimester
Insufficient Prenatal Care
OB History

Gravidity:    3         Term:   1        Prem:   0        SAB:   1
TOP:          0       Ectopic:  0        Living: 1
Fetal Evaluation

Num Of Fetuses:     1
Fetal Heart         150
Rate(bpm):
Cardiac Activity:   Observed
Presentation:       Cephalic
Placenta:           Posterior Fundal, above cervical os
P. Cord Insertion:  Previously Visualized

Amniotic Fluid
AFI FV:      Subjectively within normal limits

AFI Sum(cm)     %Tile       Largest Pocket(cm)
16.97           63

RUQ(cm)       RLQ(cm)       LUQ(cm)        LLQ(cm)
5.1
Biometry

BPD:      72.4  mm     G. Age:  29w 0d         30  %    CI:        74.57   %   70 - 86
FL/HC:      20.7   %   19.6 -
HC:      266.1  mm     G. Age:  29w 0d         12  %    HC/AC:      1.02       0.99 -
AC:       261   mm     G. Age:  30w 1d         72  %    FL/BPD:     76.2   %   71 - 87
FL:       55.2  mm     G. Age:  29w 1d         31  %    FL/AC:      21.1   %   20 - 24
HUM:      49.9  mm     G. Age:  29w 2d         46  %
CER:      33.9  mm     G. Age:  29w 3d         54  %
CM:        6.5  mm

Est. FW:    2088  gm      3 lb 3 oz     59  %
Gestational Age

LMP:           29w 2d       Date:   09/28/15                 EDD:   07/04/16
U/S Today:     29w 2d                                        EDD:   07/04/16
Best:          29w 2d    Det. By:   LMP  (09/28/15)          EDD:   07/04/16
Anatomy

Cranium:               Appears normal         Aortic Arch:            Previously seen
Cavum:                 Appears normal         Ductal Arch:            Previously seen
Ventricles:            Appears normal         Diaphragm:              Previously seen
Choroid Plexus:        Previously seen        Stomach:                Appears normal, left
sided
Cerebellum:            Appears normal         Abdomen:                Appears normal
Posterior Fossa:       Appears normal         Abdominal Wall:         Previously seen
Nuchal Fold:           Previously seen        Cord Vessels:           Previously seen
Face:                  Orbits and profile     Kidneys:                Appear normal
previously seen
Lips:                  Previously seen        Bladder:                Appears normal
Thoracic:              Appears normal         Spine:                  Previously seen
Heart:                 Appears normal         Upper Extremities:      Previously seen
(4CH, axis, and
situs)
RVOT:                  Previously seen        Lower Extremities:      Previously seen
LVOT:                  Previously seen

Other:  Fetus appears to be a female. Heels and 5th digit previously
visualized. Nasal bone previously visualized.
Cervix Uterus Adnexa

Cervix
Not visualized (advanced GA >48wks)

Left Ovary
Within normal limits.

Right Ovary
Within normal limits.

Adnexa:       No abnormality visualized.
Impression

Single IUP at 29w 2d
Hx of early demise of twin at approximately 8 weeks
Normal interval anatomy
The estimated fetal weight today is at the 59th %tile
Posterior placenta without previa
Normal amniotic fluid volume
Recommendations

Recommend follow-up ultrasound examination in 4 weeks for
interval growth

## 2018-06-12 ENCOUNTER — Encounter (HOSPITAL_COMMUNITY): Payer: Self-pay | Admitting: Emergency Medicine

## 2018-06-12 ENCOUNTER — Emergency Department (HOSPITAL_COMMUNITY)
Admission: EM | Admit: 2018-06-12 | Discharge: 2018-06-12 | Disposition: A | Discharge status: Home / Self Care | Payer: Medicaid Other | Attending: Emergency Medicine | Admitting: Emergency Medicine

## 2018-06-12 ENCOUNTER — Other Ambulatory Visit: Payer: Self-pay

## 2018-06-12 DIAGNOSIS — Z87891 Personal history of nicotine dependence: Secondary | ICD-10-CM | POA: Diagnosis not present

## 2018-06-12 DIAGNOSIS — K0889 Other specified disorders of teeth and supporting structures: Secondary | ICD-10-CM | POA: Diagnosis present

## 2018-06-12 MED ORDER — MELOXICAM 7.5 MG PO TABS: 7.5000 mg | ORAL_TABLET | Freq: Every day | ORAL | 0 refills | Status: AC

## 2018-06-12 MED ORDER — PENICILLIN V POTASSIUM 500 MG PO TABS: 500.0000 mg | ORAL_TABLET | Freq: Four times a day (QID) | ORAL | 0 refills | Status: AC

## 2018-06-12 NOTE — Discharge Instructions (Addendum)
Pain- try taking to a liquid gels along with 100 mg Tylenol every 6 hours.  If this does not help then take meloxicam as prescribed. °Take penicillin as prescribed and complete the full course. °Rinse and spit with Listerine or salt water after every meal. °Continue to brush with a soft toothbrush. °Up with a dentist as soon as possible, see resource list and discharge instructions. ° °

## 2018-06-12 NOTE — ED Provider Notes (Signed)
[facility] Provider Note   CSN: [id number] Arrival date & time: 06/12/18  1709     History   Chief Complaint Chief Complaint  Patient presents with  . Dental Pain    HPI Shelby Moosakiesha N Duncan is a 27 y.o. female.  27 year old female presents with complaint of left lower dental pain for the past week.  Patient states that her left lower wisdom tooth has come in however now the gum has swollen up over the tooth and is painful.  She denies drainage or swelling of the face.  Denies fever, trauma.  No other complaints or concerns.     Past Medical History:  Diagnosis Date  . Depression   . Infection    UTI  . OCD (obsessive compulsive disorder)   . PTSD (post-traumatic stress disorder)     Patient Active Problem List   Diagnosis Date Noted  . Pregnancy in multigravida 06/30/2016  . Supervision of normal pregnancy, antepartum 06/11/2016  . 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 Twin pregnancy w antenatal problem 12/11/2015  . Depression 09/24/2013  . Schizophrenia (HCC) 09/24/2013    Past Surgical History:  Procedure Laterality Date  . NO PAST SURGERIES       OB History    Gravida  3   Para  2   Term  2   Preterm  0   AB  1   Living  2     SAB  1   TAB  0   Ectopic  0   Multiple  0   Live Births  2            Home Medications    Prior to Admission medications   Medication Sig Start Date End Date Taking? Authorizing Provider  meloxicam (MOBIC) 7.5 MG tablet Take 1 tablet (7.5 mg total) by mouth daily for 10 days. 06/12/18 06/22/18  Army MeliaMurphy, Kevyn Boquet A, PA-C  penicillin v potassium (VEETID) 500 MG tablet Take 1 tablet (500 mg total) by mouth 4 (four) times daily for 10 days. 06/12/18 06/22/18  Jeannie FendMurphy, Alanah Sakuma A, PA-C    Family History Family History  Problem Relation Age of Onset  . Cancer Maternal Grandmother        breast  . Cancer Paternal Grandfather     Social History Social History   Tobacco Use  .  Smoking status: Former Smoker    Types: Cigarettes    Last attempt to quit: 08/18/2006    Years since quitting: 11.8  . Smokeless tobacco: Never Used  . Tobacco comment: age 27  Substance Use Topics  . Alcohol use: No  . Drug use: No     Allergies   Chocolate; Food; and Metrogel [metronidazole]   Review of Systems Review of Systems  Constitutional: Negative for chills and fever.  HENT: Positive for dental problem. Negative for ear pain, facial swelling, trouble swallowing and voice change.   Gastrointestinal: Negative for vomiting.  Musculoskeletal: Negative for neck pain and neck stiffness.  Skin: Negative for rash and wound.  Allergic/Immunologic: Negative for immunocompromised state.  Neurological: Negative for headaches.  Hematological: Negative for adenopathy.  Psychiatric/Behavioral: Negative for confusion.  All other systems reviewed and are negative.    Physical Exam Updated Vital Signs BP (!) 127/52 (BP Location: Right Arm)   Pulse 95   Temp 98.4 F (36.9 C) (Oral)   Resp 17   Ht 5\' 9"  (1.753 m)   Wt 106.6 kg   SpO2 100%  BMI 34.70 kg/m   Physical Exam  Constitutional: She is oriented to person, place, and time. She appears well-developed and well-nourished. No distress.  HENT:  Head: Normocephalic and atraumatic.  Mouth/Throat: Uvula is midline and oropharynx is clear and moist. No trismus in the jaw. Abnormal dentition. No dental abscesses, uvula swelling or dental caries. No oropharyngeal exudate.    Neck: Neck supple.  Pulmonary/Chest: Effort normal.  Neurological: She is alert and oriented to person, place, and time.  Skin: Skin is warm and dry. She is not diaphoretic. No erythema.  Psychiatric: She has a normal mood and affect. Her behavior is normal.  Nursing note and vitals reviewed.    ED Treatments / Results  Labs (all labs ordered are listed, but only abnormal results are displayed) Labs Reviewed - No data to  display  EKG None  Radiology No results found.  Procedures Procedures (including critical care time)  Medications Ordered in ED Medications - No data to display   Initial Impression / Assessment and Plan / ED Course  I have reviewed the triage vital signs and the nursing notes.  Pertinent labs & imaging results that were available during my care of the patient were reviewed by me and considered in my medical decision making (see chart for details).    Final Clinical Impressions(s) / ED Diagnoses   Final diagnoses:  Pain, dental    ED Discharge Orders         Ordered    penicillin v potassium (VEETID) 500 MG tablet  4 times daily     06/12/18 1917    meloxicam (MOBIC) 7.5 MG tablet  Daily     06/12/18 1917           Jeannie Fend, PA-C 06/12/18 Laqueta Jean, MD 06/15/18 1050

## 2018-06-12 NOTE — ED Triage Notes (Signed)
Per pt she stated a a few weeks started noticing gum swelling in the left lower jaw. Harder to chew. Pt stated she is thinking it is her wisdom tooth.

## 2019-08-03 ENCOUNTER — Emergency Department: Payer: Medicaid Other

## 2019-08-03 ENCOUNTER — Encounter: Payer: Self-pay | Admitting: Intensive Care

## 2019-08-03 ENCOUNTER — Emergency Department
Admission: EM | Admit: 2019-08-03 | Discharge: 2019-08-03 | Disposition: A | Discharge status: Home / Self Care | Payer: Medicaid Other | Attending: Emergency Medicine | Admitting: Emergency Medicine

## 2019-08-03 ENCOUNTER — Other Ambulatory Visit: Payer: Self-pay

## 2019-08-03 DIAGNOSIS — R0789 Other chest pain: Secondary | ICD-10-CM | POA: Diagnosis not present

## 2019-08-03 DIAGNOSIS — F17290 Nicotine dependence, other tobacco product, uncomplicated: Secondary | ICD-10-CM | POA: Diagnosis not present

## 2019-08-03 DIAGNOSIS — D649 Anemia, unspecified: Secondary | ICD-10-CM | POA: Insufficient documentation

## 2019-08-03 DIAGNOSIS — Z79899 Other long term (current) drug therapy: Secondary | ICD-10-CM | POA: Insufficient documentation

## 2019-08-03 DIAGNOSIS — R079 Chest pain, unspecified: Secondary | ICD-10-CM | POA: Diagnosis present

## 2019-08-03 LAB — TROPONIN I (HIGH SENSITIVITY)
Troponin I (High Sensitivity): 3 ng/L (ref <18)
Troponin I (High Sensitivity): <2 ng/L (ref <18)

## 2019-08-03 LAB — BASIC METABOLIC PANEL
Anion gap: 8 (ref 5–15)
BUN: 9 mg/dL (ref 6–20)
CO2: 22 mmol/L (ref 22–32)
Calcium: 8.5 mg/dL — ABNORMAL LOW (ref 8.9–10.3)
Chloride: 108 mmol/L (ref 98–111)
Creatinine, Ser: 0.64 mg/dL (ref 0.44–1.00)
GFR calc Af Amer: >60 mL/min (ref >60)
GFR calc non Af Amer: >60 mL/min (ref >60)
Glucose, Bld: 87 mg/dL (ref 70–99)
Potassium: 3.8 mmol/L (ref 3.5–5.1)
Sodium: 138 mmol/L (ref 135–145)

## 2019-08-03 LAB — CBC
HCT: 27.9 % — ABNORMAL LOW (ref 36.0–46.0)
Hemoglobin: 8.7 g/dL — ABNORMAL LOW (ref 12.0–15.0)
MCH: 22.8 pg — ABNORMAL LOW (ref 26.0–34.0)
MCHC: 31.2 g/dL (ref 30.0–36.0)
MCV: 73 fL — ABNORMAL LOW (ref 80.0–100.0)
Platelets: 417 10*3/uL — ABNORMAL HIGH (ref 150–400)
RBC: 3.82 MIL/uL — ABNORMAL LOW (ref 3.87–5.11)
RDW: 16.5 % — ABNORMAL HIGH (ref 11.5–15.5)
WBC: 13.5 10*3/uL — ABNORMAL HIGH (ref 4.0–10.5)
nRBC: 0 % (ref 0.0–0.2)

## 2019-08-03 LAB — RETICULOCYTES
Immature Retic Fract: 8.4 % (ref 2.3–15.9)
RBC.: 3.8 MIL/uL — ABNORMAL LOW (ref 3.87–5.11)
Retic Count, Absolute: 35 10*3/uL (ref 19.0–186.0)
Retic Ct Pct: 0.9 % (ref 0.4–3.1)

## 2019-08-03 LAB — POCT PREGNANCY, URINE: Preg Test, Ur: NEGATIVE

## 2019-08-03 MED ORDER — IRON (FERROUS SULFATE) 325 (65 FE) MG PO TABS: 1.0000 | ORAL_TABLET | Freq: Every day | ORAL | 0 refills | Status: DC

## 2019-08-03 MED ORDER — LORAZEPAM 0.5 MG PO TABS
0.5000 mg | ORAL_TABLET | Freq: Once | ORAL | Status: AC
  Administered 2019-08-03: 18:00:00 0.5 mg via ORAL
  Filled 2019-08-03: qty 1

## 2019-08-03 NOTE — Discharge Instructions (Signed)
Your lab work, EKG, chest x-ray is reassuring.  Your blood work does show that you are very anemic.  Please begin iron for this.  This may cause you to have some constipation.  Please call primary care on Monday for a follow-up appointment to recheck your anemia and for reevaluation of your current symptoms.

## 2019-08-03 NOTE — ED Provider Notes (Signed)
[facility] Emergency Department Provider Note  ____________________________________________  Time seen: Approximately 4:50 PM  I have reviewed the triage vital signs and the nursing notes.   HISTORY  Chief Complaint Chest Pain    HPI Shelby Duncan is a 28 y.o. female with PMH PTSD, anxiety, depression that presents to emergency department for evaluation of a short episode of generalized chest pain today.  Patient states that she was in her driveway at her mother's house after leaving work today when chest pain started. Chest pain was sharp and landed a couple of minutes. She also had some numbness to her right arm during episode. Symptoms resolved. She had a similar episode a couple of months ago. She had just left work early after lifting some very heavy boxes.  She had just lifted about a 300 pound box when she told her job that she needed to leave because this was too much work and the boxes are too heavy on her upper body.  She was sore after. After arriving to her mother's, she had this episode of chest discomfort.  She thinks that symptoms were related to the heavy lifting she did earlier.  She also has a history of anxiety and a history of panic attacks.  She denies any symptoms currently.  She has not been sick recently.  No cardiac history. She is not on any hormonal birth control. No recent surgeries or travel. She smokes E cigarettes. Pain did not change with breathing and seemed constant.  She used to get chest discomfort with energy drinks and has discontinued these for several months.  She has not had any caffeine recently until today when she had a latte.  No fever, shortness of breath, nausea, vomiting, abdominal pain.   Past Medical History:  Diagnosis Date  . Depression   . Infection    UTI  . OCD (obsessive compulsive disorder)   . PTSD (post-traumatic stress disorder)     Patient Active Problem List   Diagnosis Date Noted  . Pregnancy in  multigravida 06/30/2016  . Supervision of normal pregnancy, antepartum 06/11/2016  . 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 Twin pregnancy w antenatal problem 12/11/2015  . Depression 09/24/2013  . Schizophrenia (HCC) 09/24/2013    Past Surgical History:  Procedure Laterality Date  . NO PAST SURGERIES      Prior to Admission medications   Medication Sig Start Date End Date Taking? Authorizing Provider  Iron, Ferrous Sulfate, 325 (65 Fe) MG TABS Take 1 tablet by mouth daily. 08/03/19   Enid Derry, PA-C    Allergies Chocolate, Food, and Metrogel [metronidazole]  Family History  Problem Relation Age of Onset  . Cancer Maternal Grandmother        breast  . Cancer Paternal Grandfather     Social History Social History   Tobacco Use  . Smoking status: Current Every Day Smoker    Types: E-cigarettes  . Smokeless tobacco: Never Used  . Tobacco comment: age 48  Substance Use Topics  . Alcohol use: No  . Drug use: No     Review of Systems  Constitutional: No fever/chills ENT: No upper respiratory complaints. Cardiovascular: Positive for episode of chest pain. Respiratory: No cough. No SOB. Gastrointestinal: No abdominal pain.  No nausea, no vomiting.  Musculoskeletal: Negative for musculoskeletal pain. Skin: Negative for rash, abrasions, lacerations, ecchymosis. Neurological: Negative for headaches, numbness or tingling   ____________________________________________   PHYSICAL EXAM:  VITAL SIGNS: ED Triage Vitals [08/03/19 1550]  Enc Vitals Group     BP 128/83     Pulse Rate 68     Resp 16     Temp 98.2 F (36.8 C)     Temp Source Oral     SpO2 100 %     Weight 203 lb (92.1 kg)     Height 5\' 11"  (1.803 m)     Head Circumference      Peak Flow      Pain Score 0     Pain Loc      Pain Edu?      Excl. in GC?      Constitutional: Alert and oriented. Well appearing and in no acute distress. Eyes: Conjunctivae are normal. PERRL. EOMI. Head:  Atraumatic. ENT:      Ears:      Nose: No congestion/rhinnorhea.      Mouth/Throat: Mucous membranes are moist.  Neck: No stridor.  No cervical spine tenderness to palpation. Cardiovascular: Normal rate, regular rhythm.  Good peripheral circulation. Respiratory: Normal respiratory effort without tachypnea or retractions. Lungs CTAB. Good air entry to the bases with no decreased or absent breath sounds. Gastrointestinal: Bowel sounds 4 quadrants. Soft and nontender to palpation. No guarding or rigidity. No palpable masses. No distention.  Musculoskeletal: Full range of motion to all extremities. No gross deformities appreciated. Neurologic:  Normal speech and language. No gross focal neurologic deficits are appreciated.  Skin:  Skin is warm, dry and intact. No rash noted. Psychiatric: Mood and affect are normal. Speech and behavior are normal. Patient exhibits appropriate insight and judgement.   ____________________________________________   LABS (all labs ordered are listed, but only abnormal results are displayed)  Labs Reviewed  BASIC METABOLIC PANEL - Abnormal; Notable for the following components:      Result Value   Calcium 8.5 (*)    All other components within normal limits  CBC - Abnormal; Notable for the following components:   WBC 13.5 (*)    RBC 3.82 (*)    Hemoglobin 8.7 (*)    HCT 27.9 (*)    MCV 73.0 (*)    MCH 22.8 (*)    RDW 16.5 (*)    Platelets 417 (*)    All other components within normal limits  RETICULOCYTES - Abnormal; Notable for the following components:   RBC. 3.80 (*)    All other components within normal limits  POC URINE PREG, ED  POCT PREGNANCY, URINE  TROPONIN I (HIGH SENSITIVITY)  TROPONIN I (HIGH SENSITIVITY)   ____________________________________________  EKG  NSR with SA ____________________________________________  RADIOLOGY Lexine BatonI, Ison Wichmann, personally viewed and evaluated these images (plain radiographs) as part of my medical  decision making, as well as reviewing the written report by the radiologist.  Dg Chest 2 View  Result Date: 08/03/2019 CLINICAL DATA:  Chest pain with radiation to the right arm EXAM: CHEST - 2 VIEW COMPARISON:  Radiograph 10/29/2016 FINDINGS: No consolidation, features of edema, pneumothorax, or effusion. Pulmonary vascularity is normally distributed. The cardiomediastinal contours are unremarkable. No acute osseous or soft tissue abnormality. IMPRESSION: No acute cardiopulmonary abnormality. Electronically Signed   By: Kreg ShropshirePrice  DeHay M.D.   On: 08/03/2019 17:07    ____________________________________________    PROCEDURES  Procedure(s) performed:    Procedures    Medications  LORazepam (ATIVAN) tablet 0.5 mg (0.5 mg Oral Given 08/03/19 1734)     ____________________________________________   INITIAL IMPRESSION / ASSESSMENT AND PLAN / ED COURSE  Pertinent labs & imaging results that  were available during my care of the patient were reviewed by me and considered in my medical decision making (see chart for details).  Review of the [place] CSRS was performed in accordance of the [place] prior to dispensing any controlled drugs.   Patient presents to the emergency department for evaluation of a short episode of chest discomfort tonight.  Episode happened following heavy lifting at work.  She has not had any additional pain following initial episode.  She feels well in the emergency department.  Work-up is reassuring.  CBC shows that patient is anemic.  Patient states that she has a long history of anemia and her mother does as well.  She was started on iron 3 years ago when she was pregnant but discontinued.  She has not followed up with anyone recently regarding anemia.  She will be restarted on iron supplements.  She is currently on her menstrual cycle.  She has not noticed any blood in her stools.  EKG shows normal sinus rhythm with arrhythmia.  Troponin is within normal limits at 3 and  trended downward to less then 2 on recheck.  Chest x-ray negative for acute cardiopulmonary processes.  Symptoms and history not consistent with a PE.  Patient felt much improved after Ativan.  She has been talking on the phone consistently during her stay in the emergency department.  Her partner brought her fast food, which she enjoyed.  Symptoms may be due to anxiety, recent heavy lifting at work, or latte today after not having any recent caffeine.  Patient is to follow up with primary care as directed. Patient is given ED precautions to return to the ED for any worsening or new symptoms.   Shelby Duncan was evaluated in Emergency Department on 08/03/2019 for the symptoms described in the history of present illness. She was evaluated in the context of the global COVID-19 pandemic, which necessitated consideration that the patient might be at risk for infection with the SARS-CoV-2 virus that causes COVID-19. Institutional protocols and algorithms that pertain to the evaluation of patients at risk for COVID-19 are in a state of rapid change based on information released by regulatory bodies including the CDC and federal and state organizations. These policies and algorithms were followed during the patient's care in the ED.  ____________________________________________  FINAL CLINICAL IMPRESSION(S) / ED DIAGNOSES  Final diagnoses:  Atypical chest pain  Anemia, unspecified type      NEW MEDICATIONS STARTED DURING THIS VISIT:  ED Discharge Orders         Ordered    Iron, Ferrous Sulfate, 325 (65 Fe) MG TABS  Daily     08/03/19 1842              This chart was dictated using voice recognition software/Dragon. Despite best efforts to proofread, errors can occur which can change the meaning. Any change was purely unintentional.    Laban Emperor, PA-C 08/03/19 1902    Nena Polio, MD 08/03/19 2026

## 2019-08-03 NOTE — ED Notes (Signed)
Patient transported to CT 

## 2019-08-03 NOTE — ED Triage Notes (Signed)
Arrived by EMS for c/p with radiation to right arm with numbness. EMS administered 324 aspirin. EMS vitals WNL.

## 2019-09-16 ENCOUNTER — Encounter (HOSPITAL_COMMUNITY): Payer: Self-pay | Admitting: Emergency Medicine

## 2019-09-16 ENCOUNTER — Other Ambulatory Visit: Payer: Self-pay

## 2019-09-16 ENCOUNTER — Inpatient Hospital Stay (HOSPITAL_COMMUNITY)
Admission: EM | Admit: 2019-09-16 | Discharge: 2019-09-18 | DRG: 419 | Disposition: A | Discharge status: Home / Self Care | Payer: Medicaid Other | Attending: General Surgery | Admitting: General Surgery

## 2019-09-16 ENCOUNTER — Emergency Department (HOSPITAL_COMMUNITY): Payer: Medicaid Other

## 2019-09-16 DIAGNOSIS — K810 Acute cholecystitis: Secondary | ICD-10-CM | POA: Diagnosis present

## 2019-09-16 DIAGNOSIS — F209 Schizophrenia, unspecified: Secondary | ICD-10-CM | POA: Diagnosis present

## 2019-09-16 DIAGNOSIS — Z419 Encounter for procedure for purposes other than remedying health state, unspecified: Secondary | ICD-10-CM

## 2019-09-16 DIAGNOSIS — F17290 Nicotine dependence, other tobacco product, uncomplicated: Secondary | ICD-10-CM | POA: Diagnosis present

## 2019-09-16 DIAGNOSIS — Z91018 Allergy to other foods: Secondary | ICD-10-CM

## 2019-09-16 DIAGNOSIS — Z888 Allergy status to other drugs, medicaments and biological substances status: Secondary | ICD-10-CM

## 2019-09-16 DIAGNOSIS — R1013 Epigastric pain: Secondary | ICD-10-CM

## 2019-09-16 DIAGNOSIS — D649 Anemia, unspecified: Secondary | ICD-10-CM | POA: Diagnosis present

## 2019-09-16 DIAGNOSIS — K8020 Calculus of gallbladder without cholecystitis without obstruction: Secondary | ICD-10-CM

## 2019-09-16 DIAGNOSIS — K8010 Calculus of gallbladder with chronic cholecystitis without obstruction: Secondary | ICD-10-CM | POA: Diagnosis present

## 2019-09-16 DIAGNOSIS — Z809 Family history of malignant neoplasm, unspecified: Secondary | ICD-10-CM | POA: Diagnosis not present

## 2019-09-16 DIAGNOSIS — Z20828 Contact with and (suspected) exposure to other viral communicable diseases: Secondary | ICD-10-CM | POA: Diagnosis present

## 2019-09-16 LAB — CBC WITH DIFFERENTIAL/PLATELET
Abs Immature Granulocytes: 0.04 10*3/uL (ref 0.00–0.07)
Basophils Absolute: 0.1 10*3/uL (ref 0.0–0.1)
Basophils Relative: 0 %
Eosinophils Absolute: 0.4 10*3/uL (ref 0.0–0.5)
Eosinophils Relative: 3 %
HCT: 29.7 % — ABNORMAL LOW (ref 36.0–46.0)
Hemoglobin: 9.2 g/dL — ABNORMAL LOW (ref 12.0–15.0)
Immature Granulocytes: 0 %
Lymphocytes Relative: 20 %
Lymphs Abs: 2.7 10*3/uL (ref 0.7–4.0)
MCH: 23.7 pg — ABNORMAL LOW (ref 26.0–34.0)
MCHC: 31 g/dL (ref 30.0–36.0)
MCV: 76.5 fL — ABNORMAL LOW (ref 80.0–100.0)
Monocytes Absolute: 0.8 10*3/uL (ref 0.1–1.0)
Monocytes Relative: 6 %
Neutro Abs: 9.7 10*3/uL — ABNORMAL HIGH (ref 1.7–7.7)
Neutrophils Relative %: 71 %
Platelets: 362 10*3/uL (ref 150–400)
RBC: 3.88 MIL/uL (ref 3.87–5.11)
RDW: 17.7 % — ABNORMAL HIGH (ref 11.5–15.5)
WBC: 13.7 10*3/uL — ABNORMAL HIGH (ref 4.0–10.5)
nRBC: 0 % (ref 0.0–0.2)

## 2019-09-16 LAB — URINALYSIS, ROUTINE W REFLEX MICROSCOPIC
Bacteria, UA: NONE SEEN
Bilirubin Urine: NEGATIVE
Glucose, UA: NEGATIVE mg/dL
Hgb urine dipstick: NEGATIVE
Ketones, ur: NEGATIVE mg/dL
Nitrite: NEGATIVE
Protein, ur: 30 mg/dL — AB
Specific Gravity, Urine: 1.028 (ref 1.005–1.030)
pH: 6 (ref 5.0–8.0)

## 2019-09-16 LAB — COMPREHENSIVE METABOLIC PANEL
ALT: 11 U/L (ref 0–44)
AST: 14 U/L — ABNORMAL LOW (ref 15–41)
Albumin: 3.5 g/dL (ref 3.5–5.0)
Alkaline Phosphatase: 49 U/L (ref 38–126)
Anion gap: 8 (ref 5–15)
BUN: 14 mg/dL (ref 6–20)
CO2: 25 mmol/L (ref 22–32)
Calcium: 8.8 mg/dL — ABNORMAL LOW (ref 8.9–10.3)
Chloride: 107 mmol/L (ref 98–111)
Creatinine, Ser: 0.65 mg/dL (ref 0.44–1.00)
GFR calc Af Amer: >60 mL/min (ref >60)
GFR calc non Af Amer: >60 mL/min (ref >60)
Glucose, Bld: 74 mg/dL (ref 70–99)
Potassium: 3.8 mmol/L (ref 3.5–5.1)
Sodium: 140 mmol/L (ref 135–145)
Total Bilirubin: 0.2 mg/dL — ABNORMAL LOW (ref 0.3–1.2)
Total Protein: 6.9 g/dL (ref 6.5–8.1)

## 2019-09-16 LAB — CBC
HCT: 30.2 % — ABNORMAL LOW (ref 36.0–46.0)
Hemoglobin: 9.1 g/dL — ABNORMAL LOW (ref 12.0–15.0)
MCH: 23.2 pg — ABNORMAL LOW (ref 26.0–34.0)
MCHC: 30.1 g/dL (ref 30.0–36.0)
MCV: 76.8 fL — ABNORMAL LOW (ref 80.0–100.0)
Platelets: 356 10*3/uL (ref 150–400)
RBC: 3.93 MIL/uL (ref 3.87–5.11)
RDW: 17.6 % — ABNORMAL HIGH (ref 11.5–15.5)
WBC: 13.2 10*3/uL — ABNORMAL HIGH (ref 4.0–10.5)
nRBC: 0 % (ref 0.0–0.2)

## 2019-09-16 LAB — I-STAT BETA HCG BLOOD, ED (MC, WL, AP ONLY): I-stat hCG, quantitative: <5 m[IU]/mL (ref <5)

## 2019-09-16 LAB — LIPASE, BLOOD: Lipase: 30 U/L (ref 11–51)

## 2019-09-16 LAB — HIV ANTIBODY (ROUTINE TESTING W REFLEX): HIV Screen 4th Generation wRfx: NONREACTIVE

## 2019-09-16 LAB — SARS CORONAVIRUS 2 (TAT 6-24 HRS): SARS Coronavirus 2: NEGATIVE

## 2019-09-16 MED ORDER — SODIUM CHLORIDE 0.9 % IV SOLN
2.0000 g | INTRAVENOUS | Status: DC
  Administered 2019-09-16: 2 g via INTRAVENOUS
  Filled 2019-09-16 (×2): qty 20

## 2019-09-16 MED ORDER — ONDANSETRON 4 MG PO TBDP: 4.0000 mg | ORAL_TABLET | Freq: Four times a day (QID) | ORAL | Status: DC | PRN

## 2019-09-16 MED ORDER — ONDANSETRON HCL 4 MG/2ML IJ SOLN
4.0000 mg | Freq: Once | INTRAMUSCULAR | Status: AC
  Administered 2019-09-16: 4 mg via INTRAVENOUS
  Filled 2019-09-16: qty 2

## 2019-09-16 MED ORDER — HYDROMORPHONE HCL 1 MG/ML IJ SOLN: 1.0000 mg | INTRAMUSCULAR | Status: DC | PRN

## 2019-09-16 MED ORDER — SODIUM CHLORIDE 0.9% FLUSH
3.0000 mL | Freq: Once | INTRAVENOUS | Status: AC
  Administered 2019-09-16: 16:00:00 3 mL via INTRAVENOUS

## 2019-09-16 MED ORDER — SODIUM CHLORIDE 0.9 % IV BOLUS (SEPSIS)
1000.0000 mL | Freq: Once | INTRAVENOUS | Status: AC
  Administered 2019-09-16: 1000 mL via INTRAVENOUS

## 2019-09-16 MED ORDER — ENOXAPARIN SODIUM 40 MG/0.4ML ~~LOC~~ SOLN
40.0000 mg | SUBCUTANEOUS | Status: DC
  Administered 2019-09-17: 40 mg via SUBCUTANEOUS
  Filled 2019-09-16: qty 0.4

## 2019-09-16 MED ORDER — ONDANSETRON HCL 4 MG/2ML IJ SOLN
4.0000 mg | Freq: Four times a day (QID) | INTRAMUSCULAR | Status: DC | PRN
  Administered 2019-09-18: 4 mg via INTRAVENOUS
  Filled 2019-09-16: qty 2

## 2019-09-16 MED ORDER — MORPHINE SULFATE (PF) 4 MG/ML IV SOLN
4.0000 mg | Freq: Once | INTRAVENOUS | Status: AC
  Administered 2019-09-16: 4 mg via INTRAVENOUS
  Filled 2019-09-16: qty 1

## 2019-09-16 NOTE — ED Provider Notes (Addendum)
[facility] Provider Note   CSN: [id number] Arrival date & time: 09/16/19  1524     History Chief Complaint  Patient presents with  . Abdominal Pain  . Back Pain    Shelby Duncan is a 28 y.o. female.  Patient with possible history of depression, schizophrenia presenting to the emergency department for abdominal pain nausea and vomiting.  Patient reports that today around 1 PM after eating chicken she began to have epigastric abdominal pain radiating into her back.  Has had several episodes over the last couple of months with the same thing.  Has not been evaluated for anything yet.  Reports that she has just been "dealing with it" and that she has lost weight due to not eating is eating brings his pain on.  Reports occasional nonbilious vomiting.  Denies any diarrhea, constipation, dysuria, pelvic pain, vaginal bleeding, vaginal discharge, fevers, cough or URI symptoms.        Past Medical History:  Diagnosis Date  . Depression   . Infection    UTI  . OCD (obsessive compulsive disorder)   . PTSD (post-traumatic stress disorder)     Patient Active Problem List   Diagnosis Date Noted  . Acute cholecystitis 09/16/2019  . Pregnancy in multigravida 06/30/2016  . Supervision of normal pregnancy, antepartum 06/11/2016  . 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 Twin pregnancy w antenatal problem 12/11/2015  . Depression 09/24/2013  . Schizophrenia (HCC) 09/24/2013    Past Surgical History:  Procedure Laterality Date  . NO PAST SURGERIES       OB History    Gravida  3   Para  2   Term  2   Preterm  0   AB  1   Living  2     SAB  1   TAB  0   Ectopic  0   Multiple  0   Live Births  2           Family History  Problem Relation Age of Onset  . Cancer Maternal Grandmother        breast  . Cancer Paternal Grandfather     Social History   Tobacco Use  . Smoking status: Current Every Day Smoker    Types:  E-cigarettes  . Smokeless tobacco: Never Used  . Tobacco comment: age 28  Substance Use Topics  . Alcohol use: No  . Drug use: No    Home Medications Prior to Admission medications   Not on File    Allergies    Chocolate, Food, and Metrogel [metronidazole]  Review of Systems   Review of Systems  Constitutional: Positive for appetite change. Negative for chills, diaphoresis, fatigue and fever.  HENT: Negative for congestion and sore throat.   Respiratory: Negative for cough and shortness of breath.   Cardiovascular: Negative for chest pain.  Gastrointestinal: Positive for abdominal pain, nausea and vomiting. Negative for abdominal distention, anal bleeding, blood in stool, constipation, diarrhea and rectal pain.  Genitourinary: Negative for decreased urine volume, dyspareunia, dysuria, hematuria, vaginal bleeding, vaginal discharge and vaginal pain.  Musculoskeletal: Positive for back pain.  Skin: Negative for rash.  Allergic/Immunologic: Negative for immunocompromised state.  Neurological: Negative for dizziness, light-headedness and headaches.    Physical Exam Updated Vital Signs BP 129/74 (BP Location: Right Arm)   Pulse 71   Temp 97.8 F (36.6 C) (Oral)   Resp 18   LMP 09/11/2019   SpO2 100%   Physical  Exam Vitals and nursing note reviewed.  Constitutional:      Appearance: Normal appearance.  HENT:     Head: Normocephalic.     Mouth/Throat:     Mouth: Mucous membranes are moist.  Eyes:     Conjunctiva/sclera: Conjunctivae normal.  Cardiovascular:     Rate and Rhythm: Normal rate and regular rhythm.  Pulmonary:     Effort: Pulmonary effort is normal.  Abdominal:     Tenderness: There is abdominal tenderness in the epigastric area. There is no right CVA tenderness, left CVA tenderness, guarding or rebound. Negative signs include Murphy's sign, Rovsing's sign, McBurney's sign, psoas sign and obturator sign.     Hernia: No hernia is present.  Skin:     General: Skin is warm and dry.  Neurological:     Mental Status: She is alert.  Psychiatric:        Mood and Affect: Mood normal.     ED Results / Procedures / Treatments   Labs (all labs ordered are listed, but only abnormal results are displayed) Labs Reviewed  COMPREHENSIVE METABOLIC PANEL - Abnormal; Notable for the following components:      Result Value   Calcium 8.8 (*)    AST 14 (*)    Total Bilirubin 0.2 (*)    All other components within normal limits  CBC - Abnormal; Notable for the following components:   WBC 13.2 (*)    Hemoglobin 9.1 (*)    HCT 30.2 (*)    MCV 76.8 (*)    MCH 23.2 (*)    RDW 17.6 (*)    All other components within normal limits  URINALYSIS, ROUTINE W REFLEX MICROSCOPIC - Abnormal; Notable for the following components:   APPearance CLOUDY (*)    Protein, ur 30 (*)    Leukocytes,Ua MODERATE (*)    All other components within normal limits  CBC WITH DIFFERENTIAL/PLATELET - Abnormal; Notable for the following components:   WBC 13.7 (*)    Hemoglobin 9.2 (*)    HCT 29.7 (*)    MCV 76.5 (*)    MCH 23.7 (*)    RDW 17.7 (*)    Neutro Abs 9.7 (*)    All other components within normal limits  SARS CORONAVIRUS 2 (TAT 6-24 HRS)  LIPASE, BLOOD  HIV ANTIBODY (ROUTINE TESTING W REFLEX)  COMPREHENSIVE METABOLIC PANEL  I-STAT BETA HCG BLOOD, ED ([facility], [facility], [facility] ONLY)    EKG None  Radiology US Abdomen Limited RUQ  Result Date: 09/16/2019 CLINICAL DATA:  Epigastric pain. EXAM: ULTRASOUND ABDOMEN LIMITED RIGHT UPPER QUADRANT COMPARISON:  None. FINDINGS: Gallbladder: A 1.2 cm stone is seen near the neck of the gallbladder. Mild wall thickening is identified measuring 3.6 mm. No pericholecystic fluid or Murphy's sign. Common bile duct: Diameter: 3.1 mm Liver: No focal lesion identified. Within normal limits in parenchymal echogenicity. Portal vein is patent on color Doppler imaging with normal direction of blood flow towards the liver. Other: None.  IMPRESSION: 1. Cholelithiasis with mild gallbladder wall thickening but no pericholecystic fluid or Murphy's sign. If the clinical picture remains ambiguous and there is continued concern for acute cholecystitis, a HIDA scan could further evaluate. Electronically Signed   By: Dorise Bullion III M.D   On: 09/16/2019 17:27    Procedures Procedures (including critical care time)  Medications Ordered in ED Medications  enoxaparin (LOVENOX) injection 40 mg (has no administration in time range)  cefTRIAXone (ROCEPHIN) 2 g in sodium chloride 0.9 % 100 mL  IVPB (0 g Intravenous Stopped 09/16/19 2025)  HYDROmorphone (DILAUDID) injection 1 mg (has no administration in time range)  ondansetron (ZOFRAN-ODT) disintegrating tablet 4 mg (has no administration in time range)    Or  ondansetron (ZOFRAN) injection 4 mg (has no administration in time range)  sodium chloride flush (NS) 0.9 % injection 3 mL (3 mLs Intravenous Given 09/16/19 1615)  sodium chloride 0.9 % bolus 1,000 mL (0 mLs Intravenous Stopped 09/16/19 1852)  ondansetron (ZOFRAN) injection 4 mg (4 mg Intravenous Given 09/16/19 1751)  morphine 4 MG/ML injection 4 mg (4 mg Intravenous Given 09/16/19 1751)    ED Course  I have reviewed the triage vital signs and the nursing notes.  Pertinent labs & imaging results that were available during my care of the patient were reviewed by me and considered in my medical decision making (see chart for details).  Clinical Course as of Sep 15 2210  Sun Sep 16, 2019  7616 Spoke with surgery who will come down to consults and see the patient.  Patient has cholelithiasis with gallbladder wall thickening but no obvious signs of acute cholecystitis.  She does still have pain after morphine and has a elevated white count.  Surgery to evaluate.   [KM]    Clinical Course User Index [KM] Jeral Pinch   MDM Rules/Calculators/A&P                        Clinical Impression: 1. Symptomatic  cholelithiasis   2. Epigastric pain       Final Clinical Impression(s) / ED Diagnoses Final diagnoses:  Epigastric pain  Symptomatic cholelithiasis    Rx / DC Orders ED Discharge Orders    None       Arlyn Dunning, PA-C 09/16/19 2212    Arlyn Dunning, PA-C 09/16/19 2213    Gwyneth Sprout, MD 09/16/19 2255

## 2019-09-16 NOTE — ED Notes (Signed)
This RN attempted report again, was told floor RN will call back. Will call back in 77min if not heard back

## 2019-09-16 NOTE — ED Notes (Signed)
Pt eating dinner. This nurse called to give report, was told to call back. Will attempt report again in 29min.

## 2019-09-16 NOTE — H&P (Signed)
Shelby Duncan is an 29 y.o. female.   Chief Complaint: Abdominal pain HPI: Asked see patient at the request of Dr. Jeanell Sparrow of the emergency department due to epigastric and right upper quadrant abdominal pain.  The pain started this afternoon after lunch.  It is sharp in nature and severe with radiation to her back.  She has a history of intermittent right upper quadrant and epigastric abdominal pain after eating fatty greasy meals for the last 3 to 4 months.  Today was the worst episode of sharp pain and did not resolve as it has in the past.  She has had pain medication IV fluids in the emergency room and still has abdominal pain.  An ultrasound was obtained which shows gallstones with mild gallbladder wall thickening.  She had a leukocytosis to 13,400 and her liver function studies were within normal limits.  She continues to have severe right upper quadrant pain that is made better with IV pain medicine but does not make the pain resolved.  Past Medical History:  Diagnosis Date  . Depression   . Infection    UTI  . OCD (obsessive compulsive disorder)   . PTSD (post-traumatic stress disorder)     Past Surgical History:  Procedure Laterality Date  . NO PAST SURGERIES      Family History  Problem Relation Age of Onset  . Cancer Maternal Grandmother        breast  . Cancer Paternal Grandfather    Social History:  reports that she has been smoking e-cigarettes. She has never used smokeless tobacco. She reports that she does not drink alcohol or use drugs.  Allergies:  Allergies  Allergen Reactions  . Chocolate Anaphylaxis and Rash  . 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    . Metrogel [Metronidazole] Swelling and Other (See Comments)    Reaction:  Swelling of vulva     (Not in a hospital admission)   Results for orders placed or performed during the hospital encounter of 09/16/19 (from the past 48 hour(s))  Lipase, blood      Status: None   Collection Time: 09/16/19  4:26 PM  Result Value Ref Range   Lipase 30 11 - 51 U/L    Comment: Performed at [facility], [place] [address] [place], [place] [address]  Comprehensive metabolic panel     Status: Abnormal   Collection Time: 09/16/19  4:26 PM  Result Value Ref Range   Sodium 140 135 - 145 mmol/L   Potassium 3.8 3.5 - 5.1 mmol/L   Chloride 107 98 - 111 mmol/L   CO2 25 22 - 32 mmol/L   Glucose, Bld 74 70 - 99 mg/dL   BUN 14 6 - 20 mg/dL   Creatinine, Ser 0.65 0.44 - 1.00 mg/dL   Calcium 8.8 (L) 8.9 - 10.3 mg/dL   Total Protein 6.9 6.5 - 8.1 g/dL   Albumin 3.5 3.5 - 5.0 g/dL   AST 14 (L) 15 - 41 U/L   ALT 11 0 - 44 U/L   Alkaline Phosphatase 49 38 - 126 U/L   Total Bilirubin 0.2 (L) 0.3 - 1.2 mg/dL   GFR calc non Af Amer >60 >60 mL/min   GFR calc Af Amer >60 >60 mL/min   Anion gap 8 5 - 15    Comment: Performed at [facility] [address] [place], [place] [address]  CBC  Status: Abnormal   Collection Time: 09/16/19  4:26 PM  Result Value Ref Range   WBC 13.2 (H) 4.0 - 10.5 K/uL   RBC 3.93 3.87 - 5.11 MIL/uL   Hemoglobin 9.1 (L) 12.0 - 15.0 g/dL   HCT 45.8 (L) 09.9 - 83.3 %   MCV 76.8 (L) 80.0 - 100.0 fL   MCH 23.2 (L) 26.0 - 34.0 pg   MCHC 30.1 30.0 - 36.0 g/dL   RDW 82.5 (H) 05.3 - 97.6 %   Platelets 356 150 - 400 K/uL   nRBC 0.0 0.0 - 0.2 %    Comment: Performed at [facility], [address], [place], [place] [address]  CBC with Differential     Status: Abnormal   Collection Time: 09/16/19  4:26 PM  Result Value Ref Range   WBC 13.7 (H) 4.0 - 10.5 K/uL   RBC 3.88 3.87 - 5.11 MIL/uL   Hemoglobin 9.2 (L) 12.0 - 15.0 g/dL   HCT 37.9 (L) 02.4 - 09.7 %   MCV 76.5 (L) 80.0 - 100.0 fL   MCH 23.7 (L) 26.0 - 34.0 pg   MCHC 31.0 30.0 - 36.0 g/dL   RDW 35.3 (H) 29.9 - 24.2 %   Platelets 362 150 - 400 K/uL   nRBC 0.0 0.0 - 0.2 %   Neutrophils Relative % 71 %   Neutro Abs 9.7 (H) 1.7 - 7.7 K/uL   Lymphocytes Relative 20 %    Lymphs Abs 2.7 0.7 - 4.0 K/uL   Monocytes Relative 6 %   Monocytes Absolute 0.8 0.1 - 1.0 K/uL   Eosinophils Relative 3 %   Eosinophils Absolute 0.4 0.0 - 0.5 K/uL   Basophils Relative 0 %   Basophils Absolute 0.1 0.0 - 0.1 K/uL   Immature Granulocytes 0 %   Abs Immature Granulocytes 0.04 0.00 - 0.07 K/uL    Comment: Performed at [facility], [address], [place], [place] [address]  I-Stat beta hCG blood, ED     Status: None   Collection Time: 09/16/19  4:58 PM  Result Value Ref Range   I-stat hCG, quantitative <5.0 <5 mIU/mL   Comment 3            Comment:   GEST. AGE      CONC.  (mIU/mL)   <=1 WEEK        5 - 50     2 WEEKS       50 - 500     3 WEEKS       100 - 10,000     4 WEEKS     1,000 - 30,000        FEMALE AND NON-PREGNANT FEMALE:     LESS THAN 5 mIU/mL    US Abdomen Limited RUQ  Result Date: 09/16/2019 CLINICAL DATA:  Epigastric pain. EXAM: ULTRASOUND ABDOMEN LIMITED RIGHT UPPER QUADRANT COMPARISON:  None. FINDINGS: Gallbladder: A 1.2 cm stone is seen near the neck of the gallbladder. Mild wall thickening is identified measuring 3.6 mm. No pericholecystic fluid or Murphy's sign. Common bile duct: Diameter: 3.1 mm Liver: No focal lesion identified. Within normal limits in parenchymal echogenicity. Portal vein is patent on color Doppler imaging with normal direction of blood flow towards the liver. Other: None. IMPRESSION: 1. Cholelithiasis with mild gallbladder wall thickening but no pericholecystic fluid or Murphy's sign. If the clinical picture remains ambiguous and there is continued concern for acute cholecystitis, a HIDA scan could further evaluate.  Electronically Signed   By: Gerome Samavid  Williams III M.D   On: 09/16/2019 17:27    Review of Systems  Gastrointestinal: Positive for abdominal pain.  All other systems reviewed and are negative.   Blood pressure 125/64, pulse 75, temperature 98 F (36.7 C), temperature source Oral, resp. rate 18, last menstrual  period 09/11/2019, SpO2 100 %. Physical Exam  Constitutional: She appears well-developed and well-nourished.  HENT:  Head: Normocephalic.  Eyes: Pupils are equal, round, and reactive to light. No scleral icterus.  Cardiovascular: Normal rate and regular rhythm.  Respiratory: No respiratory distress.  GI: Soft. There is abdominal tenderness in the right upper quadrant and epigastric area.  Musculoskeletal:        General: Normal range of motion.     Cervical back: Normal range of motion.  Neurological: She is alert.  Skin: Skin is warm and dry.  Psychiatric: She has a normal mood and affect. Her behavior is normal.     Assessment/Plan Acute cholecystitis  Admit to inpatient  IV fluids, IV antibiotics and pain medication for control of symptoms  Plan for laparoscopic cholecystectomy tomorrow depending on schedule.  Dr. Corliss Skainssuei to see and evaluate in a.m.  Dortha Schwalbehomas A Adalberto Metzgar, MD 09/16/2019, 6:49 PM

## 2019-09-16 NOTE — ED Triage Notes (Signed)
C/o generalized abd pain and back pain x 2 hours.  Denies nausea, vomiting, diarrhea, and urinary complaints.

## 2019-09-16 NOTE — ED Notes (Signed)
Patient transported to Ultrasound 

## 2019-09-17 ENCOUNTER — Encounter (HOSPITAL_COMMUNITY): Admission: EM | Disposition: A | Discharge status: Home / Self Care | Payer: Self-pay | Source: Home / Self Care

## 2019-09-17 ENCOUNTER — Inpatient Hospital Stay (HOSPITAL_COMMUNITY): Payer: Medicaid Other

## 2019-09-17 ENCOUNTER — Inpatient Hospital Stay (HOSPITAL_COMMUNITY): Payer: Medicaid Other | Admitting: Anesthesiology

## 2019-09-17 HISTORY — PX: CHOLECYSTECTOMY: SHX55

## 2019-09-17 LAB — SURGICAL PCR SCREEN
MRSA, PCR: NEGATIVE
Staphylococcus aureus: POSITIVE — AB

## 2019-09-17 LAB — COMPREHENSIVE METABOLIC PANEL
ALT: 10 U/L (ref 0–44)
AST: 12 U/L — ABNORMAL LOW (ref 15–41)
Albumin: 2.9 g/dL — ABNORMAL LOW (ref 3.5–5.0)
Alkaline Phosphatase: 43 U/L (ref 38–126)
Anion gap: 7 (ref 5–15)
BUN: 7 mg/dL (ref 6–20)
CO2: 23 mmol/L (ref 22–32)
Calcium: 8.5 mg/dL — ABNORMAL LOW (ref 8.9–10.3)
Chloride: 110 mmol/L (ref 98–111)
Creatinine, Ser: 0.54 mg/dL (ref 0.44–1.00)
GFR calc Af Amer: >60 mL/min (ref >60)
GFR calc non Af Amer: >60 mL/min (ref >60)
Glucose, Bld: 91 mg/dL (ref 70–99)
Potassium: 4 mmol/L (ref 3.5–5.1)
Sodium: 140 mmol/L (ref 135–145)
Total Bilirubin: 0.3 mg/dL (ref 0.3–1.2)
Total Protein: 5.7 g/dL — ABNORMAL LOW (ref 6.5–8.1)

## 2019-09-17 SURGERY — LAPAROSCOPIC CHOLECYSTECTOMY WITH INTRAOPERATIVE CHOLANGIOGRAM
Anesthesia: General | Site: Abdomen

## 2019-09-17 MED ORDER — DEXMEDETOMIDINE HCL IN NACL 200 MCG/50ML IV SOLN
INTRAVENOUS | Status: AC
  Filled 2019-09-17: qty 50

## 2019-09-17 MED ORDER — FENTANYL CITRATE (PF) 250 MCG/5ML IJ SOLN
INTRAMUSCULAR | Status: DC | PRN
  Administered 2019-09-17: 100 ug via INTRAVENOUS
  Administered 2019-09-17: 150 ug via INTRAVENOUS

## 2019-09-17 MED ORDER — PROPOFOL 10 MG/ML IV BOLUS
INTRAVENOUS | Status: DC | PRN
  Administered 2019-09-17: 200 mg via INTRAVENOUS

## 2019-09-17 MED ORDER — MUPIROCIN 2 % EX OINT
1.0000 "application " | TOPICAL_OINTMENT | Freq: Two times a day (BID) | CUTANEOUS | Status: DC
  Administered 2019-09-17 – 2019-09-18 (×2): 1 via NASAL

## 2019-09-17 MED ORDER — MIDAZOLAM HCL 5 MG/5ML IJ SOLN
INTRAMUSCULAR | Status: DC | PRN
  Administered 2019-09-17: 2 mg via INTRAVENOUS

## 2019-09-17 MED ORDER — CHLORHEXIDINE GLUCONATE CLOTH 2 % EX PADS
6.0000 | MEDICATED_PAD | Freq: Every day | CUTANEOUS | Status: DC
  Administered 2019-09-18: 6 via TOPICAL

## 2019-09-17 MED ORDER — ROCURONIUM BROMIDE 10 MG/ML (PF) SYRINGE
PREFILLED_SYRINGE | INTRAVENOUS | Status: DC | PRN
  Administered 2019-09-17: 60 mg via INTRAVENOUS
  Administered 2019-09-17 (×2): 10 mg via INTRAVENOUS

## 2019-09-17 MED ORDER — BUPIVACAINE HCL (PF) 0.25 % IJ SOLN
INTRAMUSCULAR | Status: AC
  Filled 2019-09-17: qty 30

## 2019-09-17 MED ORDER — SODIUM CHLORIDE 0.9 % IV SOLN: INTRAVENOUS | Status: DC

## 2019-09-17 MED ORDER — ONDANSETRON HCL 4 MG/2ML IJ SOLN
INTRAMUSCULAR | Status: AC
  Filled 2019-09-17: qty 2

## 2019-09-17 MED ORDER — SCOPOLAMINE 1 MG/3DAYS TD PT72: 1.0000 | MEDICATED_PATCH | Freq: Once | TRANSDERMAL | Status: DC

## 2019-09-17 MED ORDER — 0.9 % SODIUM CHLORIDE (POUR BTL) OPTIME
TOPICAL | Status: DC | PRN
  Administered 2019-09-17: 1000 mL

## 2019-09-17 MED ORDER — CEFAZOLIN SODIUM-DEXTROSE 2-3 GM-%(50ML) IV SOLR
INTRAVENOUS | Status: DC | PRN
  Administered 2019-09-17: 2 g via INTRAVENOUS

## 2019-09-17 MED ORDER — SCOPOLAMINE 1 MG/3DAYS TD PT72
MEDICATED_PATCH | TRANSDERMAL | Status: AC
  Administered 2019-09-17: 1.5 mg via TRANSDERMAL
  Filled 2019-09-17: qty 1

## 2019-09-17 MED ORDER — MIDAZOLAM HCL 2 MG/2ML IJ SOLN: 0.5000 mg | Freq: Once | INTRAMUSCULAR | Status: DC | PRN

## 2019-09-17 MED ORDER — SODIUM CHLORIDE 0.9 % IR SOLN
Status: DC | PRN
  Administered 2019-09-17: 1000 mL

## 2019-09-17 MED ORDER — DEXMEDETOMIDINE HCL 200 MCG/2ML IV SOLN
INTRAVENOUS | Status: DC | PRN
  Administered 2019-09-17 (×4): 8 ug via INTRAVENOUS

## 2019-09-17 MED ORDER — METHOCARBAMOL 500 MG PO TABS
500.0000 mg | ORAL_TABLET | Freq: Three times a day (TID) | ORAL | Status: DC | PRN
  Administered 2019-09-18: 500 mg via ORAL
  Filled 2019-09-17 (×2): qty 1

## 2019-09-17 MED ORDER — PHENOL 1.4 % MT LIQD: 1.0000 | OROMUCOSAL | Status: DC | PRN

## 2019-09-17 MED ORDER — HYDROMORPHONE HCL 1 MG/ML IJ SOLN
INTRAMUSCULAR | Status: AC
  Filled 2019-09-17: qty 1

## 2019-09-17 MED ORDER — ONDANSETRON HCL 4 MG/2ML IJ SOLN
INTRAMUSCULAR | Status: DC | PRN
  Administered 2019-09-17: 4 mg via INTRAVENOUS

## 2019-09-17 MED ORDER — PROMETHAZINE HCL 25 MG/ML IJ SOLN: 6.2500 mg | INTRAMUSCULAR | Status: DC | PRN

## 2019-09-17 MED ORDER — HEMOSTATIC AGENTS (NO CHARGE) OPTIME
TOPICAL | Status: DC | PRN
  Administered 2019-09-17: 1 via TOPICAL

## 2019-09-17 MED ORDER — ACETAMINOPHEN 325 MG PO TABS
650.0000 mg | ORAL_TABLET | Freq: Four times a day (QID) | ORAL | Status: DC | PRN
  Administered 2019-09-17 – 2019-09-18 (×2): 650 mg via ORAL
  Filled 2019-09-17 (×2): qty 2

## 2019-09-17 MED ORDER — LIDOCAINE 2% (20 MG/ML) 5 ML SYRINGE
INTRAMUSCULAR | Status: DC | PRN
  Administered 2019-09-17: 40 mg via INTRAVENOUS

## 2019-09-17 MED ORDER — OXYCODONE HCL 5 MG PO TABS
5.0000 mg | ORAL_TABLET | ORAL | Status: DC | PRN
  Administered 2019-09-17 – 2019-09-18 (×3): 5 mg via ORAL
  Filled 2019-09-17 (×3): qty 1

## 2019-09-17 MED ORDER — SUGAMMADEX SODIUM 200 MG/2ML IV SOLN
INTRAVENOUS | Status: DC | PRN
  Administered 2019-09-17: 200 mg via INTRAVENOUS
  Administered 2019-09-17: 50 mg via INTRAVENOUS

## 2019-09-17 MED ORDER — PROPOFOL 10 MG/ML IV BOLUS
INTRAVENOUS | Status: AC
  Filled 2019-09-17: qty 20

## 2019-09-17 MED ORDER — FENTANYL CITRATE (PF) 250 MCG/5ML IJ SOLN
INTRAMUSCULAR | Status: AC
  Filled 2019-09-17: qty 5

## 2019-09-17 MED ORDER — SODIUM CHLORIDE 0.9 % IV SOLN
INTRAVENOUS | Status: DC | PRN
  Administered 2019-09-17: 15 mL

## 2019-09-17 MED ORDER — MIDAZOLAM HCL 2 MG/2ML IJ SOLN
INTRAMUSCULAR | Status: AC
  Filled 2019-09-17: qty 2

## 2019-09-17 MED ORDER — CEFAZOLIN SODIUM-DEXTROSE 2-4 GM/100ML-% IV SOLN
INTRAVENOUS | Status: AC
  Filled 2019-09-17: qty 100

## 2019-09-17 MED ORDER — LACTATED RINGERS IV SOLN: INTRAVENOUS | Status: DC

## 2019-09-17 MED ORDER — HYDROMORPHONE HCL 1 MG/ML IJ SOLN
0.2500 mg | INTRAMUSCULAR | Status: DC | PRN
  Administered 2019-09-17 (×2): 0.5 mg via INTRAVENOUS

## 2019-09-17 MED ORDER — DEXAMETHASONE SODIUM PHOSPHATE 10 MG/ML IJ SOLN
INTRAMUSCULAR | Status: DC | PRN
  Administered 2019-09-17: 10 mg via INTRAVENOUS

## 2019-09-17 MED ORDER — HYDROMORPHONE HCL 1 MG/ML IJ SOLN: 1.0000 mg | INTRAMUSCULAR | Status: DC | PRN

## 2019-09-17 MED ORDER — MEPERIDINE HCL 25 MG/ML IJ SOLN: 6.2500 mg | INTRAMUSCULAR | Status: DC | PRN

## 2019-09-17 MED ORDER — BUPIVACAINE HCL 0.25 % IJ SOLN
INTRAMUSCULAR | Status: DC | PRN
  Administered 2019-09-17: 10 mL

## 2019-09-17 SURGICAL SUPPLY — 50 items
APPLIER CLIP ROT 10 11.4 M/L (STAPLE) ×3
BENZOIN TINCTURE PRP APPL 2/3 (GAUZE/BANDAGES/DRESSINGS) IMPLANT
BLADE CLIPPER SURG (BLADE) IMPLANT
CANISTER SUCT 3000ML PPV (MISCELLANEOUS) ×3 IMPLANT
CHLORAPREP W/TINT 26 (MISCELLANEOUS) ×3 IMPLANT
CLIP APPLIE ROT 10 11.4 M/L (STAPLE) ×1 IMPLANT
CLOSURE STERI-STRIP 1/4X4 (GAUZE/BANDAGES/DRESSINGS) ×3 IMPLANT
CLOSURE WOUND 1/2 X4 (GAUZE/BANDAGES/DRESSINGS)
COVER MAYO STAND STRL (DRAPES) ×3 IMPLANT
COVER SURGICAL LIGHT HANDLE (MISCELLANEOUS) ×3 IMPLANT
COVER WAND RF STERILE (DRAPES) IMPLANT
DERMABOND ADVANCED (GAUZE/BANDAGES/DRESSINGS) ×2
DERMABOND ADVANCED .7 DNX12 (GAUZE/BANDAGES/DRESSINGS) ×1 IMPLANT
DRAPE C-ARM 42X120 X-RAY (DRAPES) ×3 IMPLANT
DRSG TEGADERM 2-3/8X2-3/4 SM (GAUZE/BANDAGES/DRESSINGS) ×3 IMPLANT
DRSG TEGADERM 4X4.75 (GAUZE/BANDAGES/DRESSINGS) ×3 IMPLANT
ELECT REM PT RETURN 9FT ADLT (ELECTROSURGICAL) ×3
ELECTRODE REM PT RTRN 9FT ADLT (ELECTROSURGICAL) ×1 IMPLANT
GAUZE SPONGE 2X2 8PLY STRL LF (GAUZE/BANDAGES/DRESSINGS) IMPLANT
GLOVE BIO SURGEON STRL SZ7 (GLOVE) ×3 IMPLANT
GLOVE BIOGEL PI IND STRL 7.5 (GLOVE) ×1 IMPLANT
GLOVE BIOGEL PI INDICATOR 7.5 (GLOVE) ×2
GOWN STRL REUS W/ TWL LRG LVL3 (GOWN DISPOSABLE) ×3 IMPLANT
GOWN STRL REUS W/TWL LRG LVL3 (GOWN DISPOSABLE) ×6
HEMOSTAT SNOW SURGICEL 2X4 (HEMOSTASIS) ×3 IMPLANT
KIT BASIN OR (CUSTOM PROCEDURE TRAY) ×3 IMPLANT
KIT TURNOVER KIT B (KITS) ×3 IMPLANT
NS IRRIG 1000ML POUR BTL (IV SOLUTION) ×3 IMPLANT
PAD ARMBOARD 7.5X6 YLW CONV (MISCELLANEOUS) ×3 IMPLANT
POUCH RETRIEVAL ECOSAC 10 (ENDOMECHANICALS) ×1 IMPLANT
POUCH RETRIEVAL ECOSAC 10MM (ENDOMECHANICALS) ×2
POUCH SPECIMEN RETRIEVAL 10MM (ENDOMECHANICALS) IMPLANT
SCISSORS LAP 5X35 DISP (ENDOMECHANICALS) ×3 IMPLANT
SET CHOLANGIOGRAPH 5 50 .035 (SET/KITS/TRAYS/PACK) ×6 IMPLANT
SET IRRIG TUBING LAPAROSCOPIC (IRRIGATION / IRRIGATOR) ×3 IMPLANT
SET TUBE SMOKE EVAC HIGH FLOW (TUBING) ×3 IMPLANT
SLEEVE ENDOPATH XCEL 5M (ENDOMECHANICALS) ×3 IMPLANT
SPECIMEN JAR SMALL (MISCELLANEOUS) IMPLANT
SPONGE GAUZE 2X2 8PLY STER LF (GAUZE/BANDAGES/DRESSINGS) ×1
SPONGE GAUZE 2X2 8PLY STRL LF (GAUZE/BANDAGES/DRESSINGS) ×2 IMPLANT
SPONGE GAUZE 2X2 STER 10/PKG (GAUZE/BANDAGES/DRESSINGS)
STRIP CLOSURE SKIN 1/2X4 (GAUZE/BANDAGES/DRESSINGS) IMPLANT
SUT MNCRL AB 4-0 PS2 18 (SUTURE) ×3 IMPLANT
TOWEL GREEN STERILE (TOWEL DISPOSABLE) IMPLANT
TOWEL GREEN STERILE FF (TOWEL DISPOSABLE) ×3 IMPLANT
TRAY LAPAROSCOPIC MC (CUSTOM PROCEDURE TRAY) ×3 IMPLANT
TROCAR XCEL BLUNT TIP 100MML (ENDOMECHANICALS) ×3 IMPLANT
TROCAR XCEL NON-BLD 11X100MML (ENDOMECHANICALS) ×3 IMPLANT
TROCAR XCEL NON-BLD 5MMX100MML (ENDOMECHANICALS) ×3 IMPLANT
WATER STERILE IRR 1000ML POUR (IV SOLUTION) ×3 IMPLANT

## 2019-09-17 NOTE — Discharge Instructions (Signed)
[facility] ° °LAPAROSCOPIC SURGERY: POST OP INSTRUCTIONS °Always review your discharge instruction sheet given to you by the facility where your surgery was performed. °IF YOU HAVE DISABILITY OR FAMILY LEAVE FORMS, YOU MUST BRING THEM TO THE OFFICE FOR PROCESSING.   °DO NOT GIVE THEM TO YOUR DOCTOR. ° °PAIN CONTROL ° °1. First take acetaminophen (Tylenol) AND/or ibuprofen (Advil) to control your pain after surgery.  Follow directions on package.  Taking acetaminophen (Tylenol) and/or ibuprofen (Advil) regularly after surgery will help to control your pain and lower the amount of prescription pain medication you may need.  You should not take more than 4,000 mg (4 grams) of acetaminophen (Tylenol) in 24 hours.  You should not take ibuprofen (Advil), aleve, motrin, naprosyn or other NSAIDS if you have a history of stomach ulcers or chronic kidney disease.  °2. A prescription for pain medication may be given to you upon discharge.  Take your pain medication as prescribed, if you still have uncontrolled pain after taking acetaminophen (Tylenol) or ibuprofen (Advil). °3. Use ice packs to help control pain. °4. If you need a refill on your pain medication, please contact your pharmacy.  They will contact our office to request authorization. Prescriptions will not be filled after 5pm or on week-ends. ° °HOME MEDICATIONS °5. Take your usually prescribed medications unless otherwise directed. ° °DIET °6. You should follow a light diet the first few days after arrival home.  Be sure to include lots of fluids daily. Avoid fatty, fried foods.  ° °CONSTIPATION °7. It is common to experience some constipation after surgery and if you are taking pain medication.  Increasing fluid intake and taking a stool softener (such as Colace) will usually help or prevent this problem from occurring.  A mild laxative (Milk of Magnesia or Miralax) should be taken according to package instructions if there are no bowel  movements after 48 hours. ° °WOUND/INCISION CARE °8. Most patients will experience some swelling and bruising in the area of the incisions.  Ice packs will help.  Swelling and bruising can take several days to resolve.  °9. Unless discharge instructions indicate otherwise, follow guidelines below  °a. STERI-STRIPS - you may remove your outer bandages 48 hours after surgery, and you may shower at that time.  You have steri-strips (small skin tapes) in place directly over the incision.  These strips should be left on the skin for 7-10 days.   °b. DERMABOND/SKIN GLUE - you may shower in 24 hours.  The glue will flake off over the next 2-3 weeks. °10. Any sutures or staples will be removed at the office during your follow-up visit. ° °ACTIVITIES °11. You may resume regular (light) daily activities beginning the next day--such as daily self-care, walking, climbing stairs--gradually increasing activities as tolerated.  You may have sexual intercourse when it is comfortable.  Refrain from any heavy lifting or straining until approved by your doctor. °a. You may drive when you are no longer taking prescription pain medication, you can comfortably wear a seatbelt, and you can safely maneuver your car and apply brakes. ° °FOLLOW-UP °12. You should see your doctor in the office for a follow-up appointment approximately 2-3 weeks after your surgery.  You should have been given your post-op/follow-up appointment when your surgery was scheduled.  If you did not receive a post-op/follow-up appointment, make sure that you call for this appointment within a day or two after you arrive home to insure a convenient appointment time. ° °  OTHER INSTRUCTIONS ° °WHEN TO CALL YOUR DOCTOR: °1. Fever over 101.0 °2. Inability to urinate °3. Continued bleeding from incision. °4. Increased pain, redness, or drainage from the incision. °5. Increasing abdominal pain ° °The clinic staff is available to answer your questions during regular business  hours.  Please don’t hesitate to call and ask to speak to one of the nurses for clinical concerns.  If you have a medical emergency, go to the nearest emergency room or call 911.  A surgeon from [facility] is always on call at the hospital. °[address], [address], [place], [place]  [address] ? [address] [address], [place], [place]   [address] °[phone] ? [phone] ? FAX [phone] ° ° ° °

## 2019-09-17 NOTE — Progress Notes (Signed)
Received pt here in 6N from ED, alert oriented x4, skin intact and with VSS. Pt not in distress, oriented to room, bed controls and plan of care. Left comfortably in bed with call bell at reach, will continue to monitor.

## 2019-09-17 NOTE — Op Note (Signed)
Laparoscopic Cholecystectomy with IOC Procedure Note  Indications: This patient presents with symptomatic gallbladder disease and will undergo laparoscopic cholecystectomy.  Pre-operative Diagnosis: Calculus of gallbladder with acute cholecystitis, without mention of obstruction  Post-operative Diagnosis: Same  Surgeon: Wynona Luna   Assistants: none  Anesthesia: General endotracheal anesthesia  ASA Class: 2  Procedure Details  The patient was seen again in the Holding Room. The risks, benefits, complications, treatment options, and expected outcomes were discussed with the patient. The possibilities of reaction to medication, pulmonary aspiration, perforation of viscus, bleeding, recurrent infection, finding a normal gallbladder, the need for additional procedures, failure to diagnose a condition, the possible need to convert to an open procedure, and creating a complication requiring transfusion or operation were discussed with the patient. The likelihood of improving the patient's symptoms with return to their baseline status is good.  The patient and/or family concurred with the proposed plan, giving informed consent. The site of surgery properly noted. The patient was taken to Operating Room, identified as Shelby Duncan and the procedure verified as Laparoscopic Cholecystectomy with Intraoperative Cholangiogram. A Time Out was held and the above information confirmed.  Prior to the induction of general anesthesia, antibiotic prophylaxis was administered. General endotracheal anesthesia was then administered and tolerated well. After the induction, the abdomen was prepped with Chloraprep and draped in the sterile fashion. The patient was positioned in the supine position.  Local anesthetic agent was injected into the skin below the umbilicus and an incision made. We dissected down to the abdominal fascia with blunt dissection.  The fascia was incised vertically and we entered the  peritoneal cavity bluntly.  A pursestring suture of 0-Vicryl was placed around the fascial opening.  The Hasson cannula was inserted and secured with the stay suture.  Pneumoperitoneum was then created with CO2 and tolerated well without any adverse changes in the patient's vital signs. An 11-mm port was placed in the subxiphoid position.  Two 5-mm ports were placed in the right upper quadrant. All skin incisions were infiltrated with a local anesthetic agent before making the incision and placing the trocars.   We positioned the patient in reverse Trendelenburg, tilted slightly to the patient's left.  The gallbladder was identified, the fundus grasped and retracted cephalad. The gallbladder was quite distended with some edema and minimal adhesions.  Adhesions were lysed bluntly and with the electrocautery where indicated, taking care not to injure any adjacent organs or viscus. The infundibulum was grasped and retracted laterally, exposing the peritoneum overlying the triangle of Calot. This was then divided and exposed in a blunt fashion. A critical view of the cystic duct and cystic artery was obtained.  The cystic duct was clearly identified and bluntly dissected circumferentially. The cystic duct was ligated with a clip distally.   An incision was made in the cystic duct and the [facility] cholangiogram catheter introduced. The catheter was secured using a clip. A cholangiogram was then obtained which showed good visualization of the distal and proximal biliary tree with no sign of filling defects or obstruction.  Contrast flowed easily into the duodenum. The catheter was then removed.   The cystic duct was then ligated with clips and divided. The cystic artery was identified, dissected free, ligated with clips and divided as well.   The gallbladder was dissected from the liver bed in retrograde fashion with the electrocautery. The gallbladder was removed and placed in an Eco sac. The liver bed was irrigated  and inspected. Hemostasis was achieved  with the electrocautery. Copious irrigation was utilized and was repeatedly aspirated until clear.  The gallbladder and Eco sac were then removed through the umbilical port site.  The pursestring suture was used to close the umbilical fascia.    We again inspected the right upper quadrant for hemostasis.  Pneumoperitoneum was released as we removed the trocars.  4-0 Monocryl was used to close the skin.   Benzoin, steri-strips, and clean dressings were applied. The patient was then extubated and brought to the recovery room in stable condition. Instrument, sponge, and needle counts were correct at closure and at the conclusion of the case.   Findings: Cholecystitis with Cholelithiasis  Estimated Blood Loss: less than 50 mL         Drains: none         Specimens: Gallbladder           Complications: None; patient tolerated the procedure well.         Disposition: PACU - hemodynamically stable.         Condition: stable

## 2019-09-17 NOTE — Transfer of Care (Signed)
Immediate Anesthesia Transfer of Care Note  Patient: Shelby Duncan  Procedure(s) Performed: LAPAROSCOPIC CHOLECYSTECTOMY WITH INTRAOPERATIVE CHOLANGIOGRAM (N/A Abdomen)  Patient Location: PACU  Anesthesia Type:General  Level of Consciousness: awake, alert  and oriented  Airway & Oxygen Therapy: Patient Spontanous Breathing and Patient connected to nasal cannula oxygen  Post-op Assessment: Report given to RN, Post -op Vital signs reviewed and stable and Patient moving all extremities X 4  Post vital signs: Reviewed and stable  Last Vitals:  Vitals Value Taken Time  BP 132/77 09/17/19 1204  Temp    Pulse 78 09/17/19 1204  Resp 45 09/17/19 1204  SpO2 100 % 09/17/19 1204  Vitals shown include unvalidated device data.  Last Pain:  Vitals:   09/17/19 0825  TempSrc:   PainSc: 0-No pain         Complications: No apparent anesthesia complications

## 2019-09-17 NOTE — Progress Notes (Signed)
   Subjective/Chief Complaint: Patient is still tender No nausea or vomiting   Objective: Vital signs in last 24 hours: Temp:  [97.8 F (36.6 C)-98 F (36.7 C)] 98 F (36.7 C) (12/21 0615) Pulse Rate:  [71-75] 74 (12/21 0615) Resp:  [18] 18 (12/21 0615) BP: (115-129)/(64-75) 115/70 (12/21 0615) SpO2:  [100 %] 100 % (12/21 0615) Last BM Date: (pta)  Intake/Output from previous day: 12/20 0701 - 12/21 0700 In: 1099.1 [IV Piggyback:1099.1] Out: -  Intake/Output this shift: No intake/output data recorded.  General appearance: alert, cooperative and no distress GI: soft, tender in RUQ and epigastrium  Lab Results:  Recent Labs    09/16/19 1626  WBC 13.7*  13.2*  HGB 9.2*  9.1*  HCT 29.7*  30.2*  PLT 362  356   BMET Recent Labs    09/16/19 1626  NA 140  K 3.8  CL 107  CO2 25  GLUCOSE 74  BUN 14  CREATININE 0.65  CALCIUM 8.8*   Hepatic Function Latest Ref Rng & Units 09/16/2019 06/30/2016 05/23/2016  Total Protein 6.5 - 8.1 g/dL 6.9 7.0 6.7  Albumin 3.5 - 5.0 g/dL 3.5 3.3(L) 2.8(L)  AST 15 - 41 U/L 14(L) 19 17  ALT 0 - 44 U/L 11 9(L) 9(L)  Alk Phosphatase 38 - 126 U/L 49 119 74  Total Bilirubin 0.3 - 1.2 mg/dL 0.2(L) 0.5 0.5    PT/INR No results for input(s): LABPROT, INR in the last 72 hours. ABG No results for input(s): PHART, HCO3 in the last 72 hours.  Invalid input(s): PCO2, PO2  Studies/Results: US Abdomen Limited RUQ  Result Date: 09/16/2019 CLINICAL DATA:  Epigastric pain. EXAM: ULTRASOUND ABDOMEN LIMITED RIGHT UPPER QUADRANT COMPARISON:  None. FINDINGS: Gallbladder: A 1.2 cm stone is seen near the neck of the gallbladder. Mild wall thickening is identified measuring 3.6 mm. No pericholecystic fluid or Murphy's sign. Common bile duct: Diameter: 3.1 mm Liver: No focal lesion identified. Within normal limits in parenchymal echogenicity. Portal vein is patent on color Doppler imaging with normal direction of blood flow towards the liver. Other:  None. IMPRESSION: 1. Cholelithiasis with mild gallbladder wall thickening but no pericholecystic fluid or Murphy's sign. If the clinical picture remains ambiguous and there is continued concern for acute cholecystitis, a HIDA scan could further evaluate. Electronically Signed   By: Dorise Bullion III M.D   On: 09/16/2019 17:27    Anti-infectives: Anti-infectives (From admission, onward)   Start     Dose/Rate Route Frequency Ordered Stop   09/16/19 1930  cefTRIAXone (ROCEPHIN) 2 g in sodium chloride 0.9 % 100 mL IVPB     2 g 200 mL/hr over 30 Minutes Intravenous Every 24 hours 09/16/19 1925        Assessment/Plan: s/p Procedure(s): LAPAROSCOPIC CHOLECYSTECTOMY WITH INTRAOPERATIVE CHOLANGIOGRAM (N/A) Acute cholecystitis  Proceed with surgery today.  The surgical procedure has been discussed with the patient.  Potential risks, benefits, alternative treatments, and expected outcomes have been explained.  All of the patient's questions at this time have been answered.  The likelihood of reaching the patient's treatment goal is good.  The patient understand the proposed surgical procedure and wishes to proceed.   LOS: 1 day    Shelby Duncan 09/17/2019

## 2019-09-17 NOTE — Anesthesia Postprocedure Evaluation (Signed)
Anesthesia Post Note  Patient: Shelby Duncan  Procedure(s) Performed: LAPAROSCOPIC CHOLECYSTECTOMY WITH INTRAOPERATIVE CHOLANGIOGRAM (N/A Abdomen)     Patient location during evaluation: PACU Anesthesia Type: General Level of consciousness: awake and alert, patient cooperative and oriented Pain management: pain level controlled Vital Signs Assessment: post-procedure vital signs reviewed and stable Respiratory status: spontaneous breathing, nonlabored ventilation and respiratory function stable Cardiovascular status: blood pressure returned to baseline and stable Postop Assessment: no apparent nausea or vomiting Anesthetic complications: no    Last Vitals:  Vitals:   09/17/19 1254 09/17/19 1456  BP: 112/70 129/69  Pulse: 74 68  Resp: 15   Temp: 36.9 C   SpO2: 98% 100%    Last Pain:  Vitals:   09/17/19 1254  TempSrc: Oral  PainSc:                  Nathaniel Yaden,E. Kurstin Dimarzo

## 2019-09-17 NOTE — Anesthesia Procedure Notes (Signed)
Procedure Name: Intubation Date/Time: 09/17/2019 10:31 AM Performed by: Mariea Clonts, CRNA Pre-anesthesia Checklist: Patient identified, Emergency Drugs available, Suction available and Patient being monitored Patient Re-evaluated:Patient Re-evaluated prior to induction Oxygen Delivery Method: Circle System Utilized Preoxygenation: Pre-oxygenation with 100% oxygen Induction Type: IV induction Ventilation: Mask ventilation without difficulty Laryngoscope Size: Miller and 2 Grade View: Grade I Tube type: Oral Tube size: 7.5 mm Number of attempts: 1 Airway Equipment and Method: Stylet and Oral airway Placement Confirmation: ETT inserted through vocal cords under direct vision,  positive ETCO2 and breath sounds checked- equal and bilateral Tube secured with: Tape Dental Injury: Teeth and Oropharynx as per pre-operative assessment

## 2019-09-17 NOTE — Anesthesia Preprocedure Evaluation (Addendum)
Anesthesia Evaluation  Patient identified by MRN, date of birth, ID band Patient awake    Reviewed: Allergy & Precautions, NPO status , [name] Chart, lab work & pertinent test results  History of Anesthesia Complications Negative for: history of anesthetic complications  Airway Mallampati: I  TM Distance: >3 FB Neck ROM: Full    Dental  (+) Dental Advisory Given   Pulmonary Current Smoker and Patient abstained from smoking.,  09/16/2019 SARS CoV2 NEG   breath sounds clear to auscultation       Cardiovascular negative cardio ROS   Rhythm:Regular Rate:Normal     Neuro/Psych PSYCHIATRIC DISORDERS (PTSD, OCD) Anxiety Depression Schizophrenia negative neurological ROS     GI/Hepatic negative GI ROS, Neg liver ROS,   Endo/Other  negative endocrine ROS  Renal/GU negative Renal ROS     Musculoskeletal   Abdominal   Peds  Hematology  (+) Blood dyscrasia (Hb 9.2), anemia ,   Anesthesia Other Findings   Reproductive/Obstetrics                            Anesthesia Physical Anesthesia Plan  ASA: II  Anesthesia Plan: General   Post-op Pain Management:    Induction: Intravenous  PONV Risk Score and Plan: 3 and Ondansetron, Dexamethasone and Scopolamine patch - Pre-op  Airway Management Planned: Oral ETT  Additional Equipment: None  Intra-op Plan:   Post-operative Plan: Extubation in OR  Informed Consent: I have reviewed the patients History and Physical, chart, labs and discussed the procedure including the risks, benefits and alternatives for the proposed anesthesia with the patient or authorized representative who has indicated his/her understanding and acceptance.     Dental advisory given  Plan Discussed with: CRNA and Surgeon  Anesthesia Plan Comments:        Anesthesia Quick Evaluation

## 2019-09-18 LAB — SURGICAL PATHOLOGY

## 2019-09-18 MED ORDER — ONDANSETRON 4 MG PO TBDP: 4.0000 mg | ORAL_TABLET | Freq: Four times a day (QID) | ORAL | 0 refills | Status: DC | PRN

## 2019-09-18 MED ORDER — ACETAMINOPHEN 325 MG PO TABS: 650.0000 mg | ORAL_TABLET | Freq: Four times a day (QID) | ORAL | Status: DC | PRN

## 2019-09-18 MED ORDER — OXYCODONE HCL 5 MG PO TABS: 5.0000 mg | ORAL_TABLET | Freq: Four times a day (QID) | ORAL | 0 refills | Status: DC | PRN

## 2019-09-18 NOTE — Discharge Summary (Signed)
[facility] Discharge Summary   Patient ID: Shelby Duncan MRN: [id number] DOB/AGE: 11-23-1990 28 y.o.  Admit date: 09/16/2019 Discharge date: 09/18/2019  Admitting Diagnosis: Acute cholecystitis  Discharge Diagnosis Patient Active Problem List   Diagnosis Date Noted  . Acute cholecystitis 09/16/2019  . Pregnancy in multigravida 06/30/2016  . Supervision of normal pregnancy, antepartum 06/11/2016  . 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 Twin pregnancy w antenatal problem 12/11/2015  . Depression 09/24/2013  . Schizophrenia ([place]) 09/24/2013    Consultants None  Imaging: DG Cholangiogram Operative  Result Date: 09/17/2019 CLINICAL DATA:  Cholecystectomy for cholelithiasis and cholecystitis. EXAM: INTRAOPERATIVE CHOLANGIOGRAM TECHNIQUE: Cholangiographic images from the C-arm fluoroscopic device were submitted for interpretation post-operatively. Please see the procedural report for the amount of contrast and the fluoroscopy time utilized. COMPARISON:  Right upper quadrant ultrasound on 09/16/2019 FINDINGS: Intraoperative imaging with a C-arm demonstrates normal opacified bile ducts without evidence of biliary dilatation, obstruction or filling defect. Contrast enters the duodenum normally. No contrast extravasation identified. IMPRESSION: Normal intraoperative cholangiogram. Electronically Signed   By: Aletta Edouard M.D.   On: 09/17/2019 14:25   US Abdomen Limited RUQ  Result Date: 09/16/2019 CLINICAL DATA:  Epigastric pain. EXAM: ULTRASOUND ABDOMEN LIMITED RIGHT UPPER QUADRANT COMPARISON:  None. FINDINGS: Gallbladder: A 1.2 cm stone is seen near the neck of the gallbladder. Mild wall thickening is identified measuring 3.6 mm. No pericholecystic fluid or Murphy's sign. Common bile duct: Diameter: 3.1 mm Liver: No focal lesion identified. Within normal limits in parenchymal echogenicity. Portal vein is patent on color Doppler imaging with normal direction of blood  flow towards the liver. Other: None. IMPRESSION: 1. Cholelithiasis with mild gallbladder wall thickening but no pericholecystic fluid or Murphy's sign. If the clinical picture remains ambiguous and there is continued concern for acute cholecystitis, a HIDA scan could further evaluate. Electronically Signed   By: Dorise Bullion III M.D   On: 09/16/2019 17:27    Procedures Dr. Georgette Dover (09/17/19) - Laparoscopic Cholecystectomy with [place] Hospital Course:  Shelby Duncan is a 28yo female who presented to [facility] 12/20 with acute onset abdominal pain.  An ultrasound was obtained which shows gallstones with mild gallbladder wall thickening.  She had a leukocytosis to 13,400 and her liver function studies were within normal limits.  Patient was admitted and underwent procedure listed above.  Tolerated procedure well and was transferred to the floor.  Diet was advanced as tolerated.  On POD1 the patient was voiding well, tolerating diet, ambulating well, pain well controlled, vital signs stable, incisions c/d/i and felt stable for discharge home.  Patient will follow up as below and knows to call with questions or concerns.    I have personally reviewed the patients medication history on the [place] controlled substance database.    Physical Exam: General:  Alert, NAD, pleasant, comfortable Pulm: CTAB, rate and effort normal Abd:  Soft, ND, +BS, appropriately tender, multiple lap incisions with C/D/I dressings in place  Allergies as of 09/18/2019      Reactions   Chocolate Anaphylaxis, Rash   Food Anaphylaxis, Rash, Other (See Comments)   Pt states that she is allergic to oranges.     Metrogel [metronidazole] Swelling, Other (See Comments)   Reaction:  Swelling of vulva       Medication List    TAKE these medications   acetaminophen 325 MG tablet Commonly known as: TYLENOL Take 2 tablets (650 mg total) by mouth every 6 (six) hours as needed for mild  pain.   ondansetron 4 MG disintegrating  tablet Commonly known as: ZOFRAN-ODT Take 1 tablet (4 mg total) by mouth every 6 (six) hours as needed for nausea.   oxyCODONE 5 MG immediate release tablet Commonly known as: Oxy IR/ROXICODONE Take 1 tablet (5 mg total) by mouth every 6 (six) hours as needed for severe pain.        Follow-up Information    [facility], [place]. Call on 10/09/2019.   Specialty: General Surgery Why: A provider will call you 10/09/2019 at 8:45am. Please send a photo of your incisions to [email or web address] prior to your appointment. Contact information: [address] [place] [address] [phone]          Signed: Franne Forts, [facility] 09/18/2019, 9:34 AM Please see Amion for pager number during day hours 7:00am-4:30pm

## 2020-03-18 ENCOUNTER — Encounter: Payer: Self-pay | Admitting: Obstetrics

## 2020-03-18 ENCOUNTER — Ambulatory Visit (INDEPENDENT_AMBULATORY_CARE_PROVIDER_SITE_OTHER): Payer: Medicaid Other | Admitting: *Deleted

## 2020-03-18 ENCOUNTER — Other Ambulatory Visit: Payer: Self-pay

## 2020-03-18 DIAGNOSIS — Z3201 Encounter for pregnancy test, result positive: Secondary | ICD-10-CM

## 2020-03-18 DIAGNOSIS — Z3480 Encounter for supervision of other normal pregnancy, unspecified trimester: Secondary | ICD-10-CM | POA: Insufficient documentation

## 2020-03-18 NOTE — Progress Notes (Signed)
Shelby Duncan presents today for UPT. She has no unusual complaints, pt is having some HA's and unable to eat much.  LMP:01/21/20 EDD:10/27/2020    OBJECTIVE: Appears well, in no apparent distress.  OB History    Gravida  3   Para  2   Term  2   Preterm  0   AB  1   Living  2     SAB  1   TAB  0   Ectopic  0   Multiple  0   Live Births  2          Home UPT Result:positive In-Office UPT result:positive  I have reviewed the patient's medical, obstetrical, social, and family histories, and medications.   ASSESSMENT: Positive pregnancy test  PLAN Prenatal care to be completed at: [facility]  Recommend extra strength Tylenol for HA. Advised to have snacks/smoothies/shakes.

## 2020-03-19 NOTE — Progress Notes (Signed)
Patient was assessed and managed by nursing staff during this encounter. I have reviewed the chart and agree with the documentation and plan. I have also made any necessary editorial changes.  Warden Fillers, MD 03/19/2020 9:06 PM

## 2020-03-24 ENCOUNTER — Encounter (HOSPITAL_COMMUNITY): Payer: Self-pay | Admitting: Obstetrics and Gynecology

## 2020-03-24 ENCOUNTER — Inpatient Hospital Stay (HOSPITAL_COMMUNITY)
Admission: AD | Admit: 2020-03-24 | Discharge: 2020-03-25 | Disposition: A | Discharge status: Home / Self Care | Payer: Medicaid Other | Attending: Obstetrics and Gynecology | Admitting: Obstetrics and Gynecology

## 2020-03-24 DIAGNOSIS — Z6790 Unspecified blood type, Rh positive: Secondary | ICD-10-CM | POA: Diagnosis not present

## 2020-03-24 DIAGNOSIS — Z87440 Personal history of urinary (tract) infections: Secondary | ICD-10-CM | POA: Insufficient documentation

## 2020-03-24 DIAGNOSIS — Z91013 Allergy to seafood: Secondary | ICD-10-CM | POA: Insufficient documentation

## 2020-03-24 DIAGNOSIS — Z3A09 9 weeks gestation of pregnancy: Secondary | ICD-10-CM | POA: Diagnosis not present

## 2020-03-24 DIAGNOSIS — Z888 Allergy status to other drugs, medicaments and biological substances status: Secondary | ICD-10-CM | POA: Diagnosis not present

## 2020-03-24 DIAGNOSIS — O208 Other hemorrhage in early pregnancy: Secondary | ICD-10-CM | POA: Insufficient documentation

## 2020-03-24 DIAGNOSIS — O99891 Other specified diseases and conditions complicating pregnancy: Secondary | ICD-10-CM | POA: Diagnosis not present

## 2020-03-24 DIAGNOSIS — D649 Anemia, unspecified: Secondary | ICD-10-CM | POA: Diagnosis not present

## 2020-03-24 DIAGNOSIS — Z3490 Encounter for supervision of normal pregnancy, unspecified, unspecified trimester: Secondary | ICD-10-CM

## 2020-03-24 DIAGNOSIS — O26891 Other specified pregnancy related conditions, first trimester: Secondary | ICD-10-CM | POA: Diagnosis not present

## 2020-03-24 DIAGNOSIS — O99011 Anemia complicating pregnancy, first trimester: Secondary | ICD-10-CM

## 2020-03-24 DIAGNOSIS — O418X10 Other specified disorders of amniotic fluid and membranes, first trimester, not applicable or unspecified: Secondary | ICD-10-CM

## 2020-03-24 DIAGNOSIS — Z87891 Personal history of nicotine dependence: Secondary | ICD-10-CM | POA: Insufficient documentation

## 2020-03-24 DIAGNOSIS — R109 Unspecified abdominal pain: Secondary | ICD-10-CM | POA: Diagnosis present

## 2020-03-24 DIAGNOSIS — Z3A01 Less than 8 weeks gestation of pregnancy: Secondary | ICD-10-CM

## 2020-03-24 DIAGNOSIS — M545 Low back pain: Secondary | ICD-10-CM | POA: Insufficient documentation

## 2020-03-24 DIAGNOSIS — R21 Rash and other nonspecific skin eruption: Secondary | ICD-10-CM

## 2020-03-24 DIAGNOSIS — R1030 Lower abdominal pain, unspecified: Secondary | ICD-10-CM | POA: Diagnosis not present

## 2020-03-24 LAB — CBC
HCT: 32.7 % — ABNORMAL LOW (ref 36.0–46.0)
Hemoglobin: 10.2 g/dL — ABNORMAL LOW (ref 12.0–15.0)
MCH: 25 pg — ABNORMAL LOW (ref 26.0–34.0)
MCHC: 31.2 g/dL (ref 30.0–36.0)
MCV: 80.1 fL (ref 80.0–100.0)
Platelets: 377 10*3/uL (ref 150–400)
RBC: 4.08 MIL/uL (ref 3.87–5.11)
RDW: 16.7 % — ABNORMAL HIGH (ref 11.5–15.5)
WBC: 12.5 10*3/uL — ABNORMAL HIGH (ref 4.0–10.5)
nRBC: 0 % (ref 0.0–0.2)

## 2020-03-24 NOTE — MAU Provider Note (Signed)
History     CSN: [id number]  Arrival date and time: 03/24/20 2310   First Provider Initiated Contact with Patient 03/24/20 2352      Chief Complaint  Patient presents with  . Abdominal Pain  . Back Pain  . Fall  . Emesis   Shelby Duncan is a 29 y.o. [id number] at [redacted]w[redacted]d who presents to MAU for lower abdominal pain and LBP.  Onset: yesterday Location: lower abdomen bilaterally and LBP bilaterally Duration: ~24hours Character: cramping Aggravating/Associated: lying down/none Relieving: sitting upright Treatment: none, pt declines pain medication in MAU Severity: 7/10  Pt denies VB, vaginal discharge/odor/itching. Pt denies N/V, constipation, diarrhea, or urinary problems. Pt denies fever, chills, fatigue, sweating or changes in appetite. Pt denies SOB or chest pain. Pt denies dizziness, HA, light-headedness, weakness.  Problems this pregnancy include: pt has not yet been seen. Allergies? Chocolate, oranges, metronidazole, shellfish Current medications/supplements? none Prenatal care provider? [facility], next appt 04/07/2020   OB History    Gravida  4   Para  2   Term  2   Preterm  0   AB  1   Living  2     SAB  1   TAB  0   Ectopic  0   Multiple  0   Live Births  2           Past Medical History:  Diagnosis Date  . Depression   . Infection    UTI  . OCD (obsessive compulsive disorder)   . PTSD (post-traumatic stress disorder)     Past Surgical History:  Procedure Laterality Date  . CHOLECYSTECTOMY N/A 09/17/2019   Procedure: LAPAROSCOPIC CHOLECYSTECTOMY WITH INTRAOPERATIVE CHOLANGIOGRAM;  Surgeon: Donnie Mesa, MD;  Location: [place];  Service: General;  Laterality: N/A;  . NO PAST SURGERIES      Family History  Problem Relation Age of Onset  . Cancer Maternal Grandmother        breast  . Cancer Paternal Grandfather     Social History   Tobacco Use  . Smoking status: Former Smoker    Types: E-cigarettes    Quit date:  09/16/2019    Years since quitting: 0.5  . Smokeless tobacco: Never Used  . Tobacco comment: age 89  Vaping Use  . Vaping Use: Never used  Substance Use Topics  . Alcohol use: No  . Drug use: Yes    Types: Marijuana    Comment: quite several weeks ago    Allergies:  Allergies  Allergen Reactions  . Chocolate Anaphylaxis and Rash  . 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    . Metrogel [Metronidazole] Swelling and Other (See Comments)    Reaction:  Swelling of vulva   . Shellfish Allergy Swelling and Rash    Medications Prior to Admission  Medication Sig Dispense Refill Last Dose  . acetaminophen (TYLENOL) 325 MG tablet Take 2 tablets (650 mg total) by mouth every 6 (six) hours as needed for mild pain. (Patient not taking: Reported on 03/18/2020)     . ondansetron (ZOFRAN-ODT) 4 MG disintegrating tablet Take 1 tablet (4 mg total) by mouth every 6 (six) hours as needed for nausea. (Patient not taking: Reported on 03/18/2020) 15 tablet 0   . oxyCODONE (OXY IR/ROXICODONE) 5 MG immediate release tablet Take 1 tablet (5 mg total) by mouth every 6 (six) hours as needed for severe pain. (Patient not taking: Reported on  03/18/2020) 15 tablet 0     Review of Systems  Constitutional: Negative for chills, diaphoresis, fatigue and fever.  Eyes: Negative for visual disturbance.  Respiratory: Negative for shortness of breath.   Cardiovascular: Negative for chest pain.  Gastrointestinal: Positive for abdominal pain (lower). Negative for constipation, diarrhea, nausea and vomiting.  Genitourinary: Negative for dysuria, flank pain, frequency, pelvic pain, urgency, vaginal bleeding and vaginal discharge.  Musculoskeletal: Positive for back pain (LBP).  Neurological: Negative for dizziness, weakness, light-headedness and headaches.   Physical Exam   Blood pressure (!) 125/52, pulse 88, temperature 98.2 F (36.8 C), temperature source Oral,  resp. rate 17, weight 90.8 kg, last menstrual period 01/21/2020.  Patient Vitals for the past 24 hrs:  BP Temp Temp src Pulse Resp Weight  03/24/20 2323 (!) 125/52 98.2 F (36.8 C) Oral 88 17 90.8 kg   Physical Exam Constitutional:      General: She is not in acute distress.    Appearance: She is well-developed. She is not diaphoretic.  HENT:     Head: Normocephalic and atraumatic.  Pulmonary:     Effort: Pulmonary effort is normal.  Abdominal:     General: There is no distension.     Palpations: Abdomen is soft. There is no mass.     Tenderness: There is no abdominal tenderness. There is no guarding or rebound.  Skin:    General: Skin is warm and dry.  Neurological:     Mental Status: She is alert and oriented to person, place, and time.  Psychiatric:        Behavior: Behavior normal.        Thought Content: Thought content normal.        Judgment: Judgment normal.    Results for orders placed or performed during the hospital encounter of 03/24/20 (from the past 24 hour(s))  CBC     Status: Abnormal   Collection Time: 03/24/20 11:40 PM  Result Value Ref Range   WBC 12.5 (H) 4.0 - 10.5 K/uL   RBC 4.08 3.87 - 5.11 MIL/uL   Hemoglobin 10.2 (L) 12.0 - 15.0 g/dL   HCT 50.0 (L) 36 - 46 %   MCV 80.1 80.0 - 100.0 fL   MCH 25.0 (L) 26.0 - 34.0 pg   MCHC 31.2 30.0 - 36.0 g/dL   RDW 93.8 (H) 18.2 - 99.3 %   Platelets 377 150 - 400 K/uL   nRBC 0.0 0.0 - 0.2 %  Comprehensive metabolic panel     Status: Abnormal   Collection Time: 03/24/20 11:40 PM  Result Value Ref Range   Sodium 138 135 - 145 mmol/L   Potassium 3.6 3.5 - 5.1 mmol/L   Chloride 108 98 - 111 mmol/L   CO2 23 22 - 32 mmol/L   Glucose, Bld 107 (H) 70 - 99 mg/dL   BUN 12 6 - 20 mg/dL   Creatinine, Ser 7.16 0.44 - 1.00 mg/dL   Calcium 8.8 (L) 8.9 - 10.3 mg/dL   Total Protein 7.2 6.5 - 8.1 g/dL   Albumin 3.6 3.5 - 5.0 g/dL   AST 18 15 - 41 U/L   ALT 11 0 - 44 U/L   Alkaline Phosphatase 50 38 - 126 U/L   Total  Bilirubin 0.6 0.3 - 1.2 mg/dL   GFR calc non Af Amer >60 >60 mL/min   GFR calc Af Amer >60 >60 mL/min   Anion gap 7 5 - 15  hCG, quantitative, pregnancy  Status: Abnormal   Collection Time: 03/24/20 11:40 PM  Result Value Ref Range   hCG, Beta Chain, Quant, S 38,469 (H) <5 mIU/mL  Urinalysis, Routine w reflex microscopic     Status: Abnormal   Collection Time: 03/25/20 12:09 AM  Result Value Ref Range   Color, Urine YELLOW YELLOW   APPearance CLOUDY (A) CLEAR   Specific Gravity, Urine 1.035 (H) 1.005 - 1.030   pH 5.0 5.0 - 8.0   Glucose, UA NEGATIVE NEGATIVE mg/dL   Hgb urine dipstick NEGATIVE NEGATIVE   Bilirubin Urine NEGATIVE NEGATIVE   Ketones, ur NEGATIVE NEGATIVE mg/dL   Protein, ur 30 (A) NEGATIVE mg/dL   Nitrite NEGATIVE NEGATIVE   Leukocytes,Ua MODERATE (A) NEGATIVE   RBC / HPF 6-10 0 - 5 RBC/hpf   WBC, UA 21-50 0 - 5 WBC/hpf   Bacteria, UA FEW (A) NONE SEEN   Squamous Epithelial / LPF 11-20 0 - 5   Mucus PRESENT    Ca Oxalate Crys, UA PRESENT   Wet prep, genital     Status: Abnormal   Collection Time: 03/25/20 12:10 AM   Specimen: Vaginal  Result Value Ref Range   Yeast Wet Prep HPF POC NONE SEEN NONE SEEN   Trich, Wet Prep NONE SEEN NONE SEEN   Clue Cells Wet Prep HPF POC PRESENT (A) NONE SEEN   WBC, Wet Prep HPF POC MANY (A) NONE SEEN   Sperm NONE SEEN    US OB LESS THAN 14 WEEKS WITH OB TRANSVAGINAL  Result Date: 03/25/2020 CLINICAL DATA:  Initial evaluation for acute abdominal pain, cramping. Early pregnancy. EXAM: OBSTETRIC <14 WK [place] AND TRANSVAGINAL OB US TECHNIQUE: Both transabdominal and transvaginal ultrasound examinations were performed for complete evaluation of the gestation as well as the maternal uterus, adnexal regions, and pelvic cul-de-sac. Transvaginal technique was performed to assess early pregnancy. COMPARISON:  None. FINDINGS: Intrauterine gestational sac: Single Yolk sac:  Present Embryo:  Present Cardiac Activity: Present Heart Rate: 111  bpm CRL: 3.9 mm   6 w   0 d                  [place] EDC: 11/18/2020 Subchorionic hemorrhage: Trace subchorionic hemorrhage without associated mass effect. Maternal uterus/adnexae: Ovaries are normal in appearance bilaterally. No adnexal mass or free fluid within the pelvis. IMPRESSION: 1. Single viable intrauterine pregnancy as above, estimated gestational age [redacted] weeks and 0 days by crown-rump length, with ultrasound EDC of 11/18/2020. 2. Trace subchorionic hemorrhage without associated mass effect. 3. No other acute maternal uterine or adnexal abnormality identified. Electronically Signed   By: Rise Mu M.D.   On: 03/25/2020 00:41   MAU Course  Procedures  MDM -r/o ectopic -UA: cloudy/SG 1.035/30PRO/mod leuks/few bacteria, sending urine for culture -CBC: H/H 10.2/32.7, pt reports is aware of anemia, but does not have iron at home -CMP: no abnormalities requiring treatment -[place]: single IUP, [redacted]w[redacted]d, FHR 111, trace SCH, EDD based on early [place] -after patient returned from [place], she developed some small, red, itchy welts on inner thighs. Patient now reporting allergy to latex, but confirmed with ultrasonographer that no latex is used during [place]. Benadryl IM 25mg   Given, itching/rash resolved. -hCG: -ABO: B Positive -WetPrep: +ClueCells (isolated finding not requiring treatment) -GC/CT collected -pt discharged to home in stable condition  Orders Placed This Encounter  Procedures  . Wet prep, genital    Standing Status:   Standing    Number of Occurrences:   1  . 01,655  OB LESS THAN 14 WEEKS WITH OB TRANSVAGINAL    Standing Status:   Standing    Number of Occurrences:   1    Order Specific Question:   Symptom/Reason for Exam    Answer:   Abdominal pain in pregnancy [867619]  . Urinalysis, Routine w reflex microscopic    Standing Status:   Standing    Number of Occurrences:   1  . CBC    Standing Status:   Standing    Number of Occurrences:   1  . Comprehensive metabolic panel     Standing Status:   Standing    Number of Occurrences:   1  . hCG, quantitative, pregnancy    Standing Status:   Standing    Number of Occurrences:   1  . Discharge patient    Order Specific Question:   Discharge disposition    Answer:   01-Home or Self Care [1]    Order Specific Question:   Discharge patient date    Answer:   03/25/2020   Meds ordered this encounter  Medications  . diphenhydrAMINE (BENADRYL) injection 25 mg  . ferrous sulfate 325 (65 FE) MG tablet    Sig: Take 1 tablet (325 mg total) by mouth daily.    Dispense:  30 tablet    Refill:  3    Order Specific Question:   Supervising Provider    Answer:   Conan Bowens [[id number]]    Assessment and Plan   1. Intrauterine pregnancy   2. Abdominal pain in pregnancy   3. Low back pain during pregnancy in first trimester   4. Blood type, Rh positive   5. Anemia during pregnancy in first trimester   6. [redacted] weeks gestation of pregnancy   7. Subchorionic hemorrhage of placenta in first trimester, single or unspecified fetus   8. Rash due to allergy    Allergies as of 03/25/2020      Reactions   Chocolate Anaphylaxis, Rash   Food Anaphylaxis, Rash, Other (See Comments)   Pt states that she is allergic to oranges.     Metrogel [metronidazole] Swelling, Other (See Comments)   Reaction:  Swelling of vulva    Shellfish Allergy Swelling, Rash      Medication List    TAKE these medications   acetaminophen 325 MG tablet Commonly known as: TYLENOL Take 2 tablets (650 mg total) by mouth every 6 (six) hours as needed for mild pain.   ferrous sulfate 325 (65 FE) MG tablet Take 1 tablet (325 mg total) by mouth daily.   ondansetron 4 MG disintegrating tablet Commonly known as: ZOFRAN-ODT Take 1 tablet (4 mg total) by mouth every 6 (six) hours as needed for nausea.   oxyCODONE 5 MG immediate release tablet Commonly known as: Oxy IR/ROXICODONE Take 1 tablet (5 mg total) by mouth every 6 (six) hours as needed for severe  pain.      -will call with culture results, if positive -discussed hydration status -anemia in pregnancy, RX iron -keep prenatal appt as planned -discussed expectations with [facility] -EDD based on [place] -return MAU precautions given -pt discharged to home in stable condition  Joni Reining [place] 03/25/2020, 2:40 AM

## 2020-03-24 NOTE — MAU Note (Addendum)
Pt reports to MAU stating yesterday afternoon she fell while playing tackle with her 29yo child pt states she landed on the sidewalk on her abdomen. Pt denies bleeding or LOF. Pt reports she is having pain in her abdomen to her back that is a 8/10. Pt denies hitting her head. Pt states she has been vomiting today 5x. Pt states she noticed some blood streaks in her blood but c/o a lot of acid reflux.

## 2020-03-25 ENCOUNTER — Inpatient Hospital Stay (HOSPITAL_COMMUNITY): Payer: Medicaid Other

## 2020-03-25 DIAGNOSIS — Z3A09 9 weeks gestation of pregnancy: Secondary | ICD-10-CM

## 2020-03-25 DIAGNOSIS — M545 Low back pain: Secondary | ICD-10-CM

## 2020-03-25 DIAGNOSIS — O26891 Other specified pregnancy related conditions, first trimester: Secondary | ICD-10-CM

## 2020-03-25 DIAGNOSIS — R1030 Lower abdominal pain, unspecified: Secondary | ICD-10-CM

## 2020-03-25 LAB — WET PREP, GENITAL
Sperm: NONE SEEN
Trich, Wet Prep: NONE SEEN
Yeast Wet Prep HPF POC: NONE SEEN

## 2020-03-25 LAB — COMPREHENSIVE METABOLIC PANEL
ALT: 11 U/L (ref 0–44)
AST: 18 U/L (ref 15–41)
Albumin: 3.6 g/dL (ref 3.5–5.0)
Alkaline Phosphatase: 50 U/L (ref 38–126)
Anion gap: 7 (ref 5–15)
BUN: 12 mg/dL (ref 6–20)
CO2: 23 mmol/L (ref 22–32)
Calcium: 8.8 mg/dL — ABNORMAL LOW (ref 8.9–10.3)
Chloride: 108 mmol/L (ref 98–111)
Creatinine, Ser: 0.67 mg/dL (ref 0.44–1.00)
GFR calc Af Amer: >60 mL/min (ref >60)
GFR calc non Af Amer: >60 mL/min (ref >60)
Glucose, Bld: 107 mg/dL — ABNORMAL HIGH (ref 70–99)
Potassium: 3.6 mmol/L (ref 3.5–5.1)
Sodium: 138 mmol/L (ref 135–145)
Total Bilirubin: 0.6 mg/dL (ref 0.3–1.2)
Total Protein: 7.2 g/dL (ref 6.5–8.1)

## 2020-03-25 LAB — GC/CHLAMYDIA PROBE AMP (~~LOC~~) NOT AT ARMC
Chlamydia: POSITIVE — AB
Comment: NEGATIVE
Comment: NORMAL
Neisseria Gonorrhea: NEGATIVE

## 2020-03-25 LAB — URINALYSIS, ROUTINE W REFLEX MICROSCOPIC
Bilirubin Urine: NEGATIVE
Glucose, UA: NEGATIVE mg/dL
Hgb urine dipstick: NEGATIVE
Ketones, ur: NEGATIVE mg/dL
Nitrite: NEGATIVE
Protein, ur: 30 mg/dL — AB
Specific Gravity, Urine: 1.035 — ABNORMAL HIGH (ref 1.005–1.030)
pH: 5 (ref 5.0–8.0)

## 2020-03-25 LAB — HCG, QUANTITATIVE, PREGNANCY: hCG, Beta Chain, Quant, S: 38469 m[IU]/mL — ABNORMAL HIGH (ref <5)

## 2020-03-25 MED ORDER — DIPHENHYDRAMINE HCL 50 MG/ML IJ SOLN
25.0000 mg | Freq: Once | INTRAMUSCULAR | Status: AC
  Administered 2020-03-25: 25 mg via INTRAMUSCULAR
  Filled 2020-03-25: qty 1

## 2020-03-25 MED ORDER — FERROUS SULFATE 325 (65 FE) MG PO TABS: 325.0000 mg | ORAL_TABLET | Freq: Every day | ORAL | 3 refills | Status: DC

## 2020-03-25 NOTE — MAU Note (Signed)
Pt back from U/S. Told U/S tech that she feels like she is breaking out on her legs thinks some latex might have been used on probe. No latex is used in U/S but pt reports feeling itchy and some redness noted on the inside of both her thighs ( possible from scratching). notified  N.nugent,NP and Benadryl ordered after she examined her.

## 2020-03-25 NOTE — Discharge Instructions (Signed)
Safe Medications in Pregnancy    Acne: Benzoyl Peroxide Salicylic Acid  Backache/Headache: Tylenol: 2 regular strength every 4 hours OR              2 Extra strength every 6 hours  Colds/Coughs/Allergies: Benadryl (alcohol free) 25 mg every 6 hours as needed Breath right strips Claritin Cepacol throat lozenges Chloraseptic throat spray Cold-Eeze- up to three times per day Cough drops, alcohol free Flonase (by prescription only) Guaifenesin Mucinex Robitussin DM (plain only, alcohol free) Saline nasal spray/drops Sudafed (pseudoephedrine) & Actifed ** use only after [redacted] weeks gestation and if you do not have high blood pressure Tylenol Vicks Vaporub Zinc lozenges Zyrtec   Constipation: Colace Ducolax suppositories Fleet enema Glycerin suppositories Metamucil Milk of magnesia Miralax Senokot Smooth move tea  Diarrhea: Kaopectate Imodium A-D  *NO pepto Bismol  Hemorrhoids: Anusol Anusol HC Preparation H Tucks  Indigestion: Tums Maalox Mylanta Zantac  Pepcid  Insomnia: Benadryl (alcohol free)  every 6 hours as needed Tylenol PM Unisom, no Gelcaps  Leg Cramps: Tums MagGel  Nausea/Vomiting:  Bonine Dramamine Emetrol Ginger extract Sea bands Meclizine  Nausea medication to take during pregnancy:  Unisom (doxylamine succinate 25 mg tablets) Take one tablet daily at bedtime. If symptoms are not adequately controlled, the dose can be increased to a maximum recommended dose of two tablets daily (1/2 tablet in the morning, 1/2 tablet mid-afternoon and one at bedtime). Vitamin B6  tablets. Take one tablet twice a day (up to 200 mg per day).  Skin Rashes: Aveeno products Benadryl cream or  every 6 hours as needed Calamine Lotion 1% cortisone cream  Yeast infection: Gyne-lotrimin 7 Monistat 7   **If taking multiple medications, please check labels to avoid duplicating the same active ingredients **take  medication as directed on the label ** Do not exceed 4000 mg of tylenol in 24 hours **Do not take medications that contain aspirin or ibuprofen           Eating Plan for Pregnant Women While you are pregnant, your body requires additional nutrition to help support your growing baby. You also have a higher need for some vitamins and minerals, such as folic acid, calcium, iron, and vitamin D. Eating a healthy, well-balanced diet is very important for your health and your baby's health. Your need for extra calories varies for the three 7-month segments of your pregnancy (trimesters). For most women, it is recommended to consume:  150 extra calories a day during the first trimester.  300 extra calories a day during the second trimester.  300 extra calories a day during the third trimester. What are tips for following this plan?   Do not try to lose weight or go on a diet during pregnancy.  Limit your overall intake of foods that have "empty calories." These are foods that have little nutritional value, such as sweets, desserts, candies, and sugar-sweetened beverages.  Eat a variety of foods (especially fruits and vegetables) to get a full range of vitamins and minerals.  Take a prenatal vitamin to help meet your additional vitamin and mineral needs during pregnancy, specifically for folic acid, iron, calcium, and vitamin D.  Remember to stay active. Ask your health care provider what types of exercise and activities are safe for you.  Practice good food safety and cleanliness. Wash your hands before you eat and after you prepare raw meat. Wash all fruits and vegetables well before peeling or eating. Taking these actions can help to prevent food-borne illnesses that can  be very dangerous to your baby, such as listeriosis. Ask your health care provider for more information about listeriosis. What does 150 extra calories look like? Healthy options that provide 150 extra calories each day  could be any of the following:  6-8 oz (170-230 g) of plain low-fat yogurt with  cup of berries.  1 apple with 2 teaspoons (11 g) of peanut butter.  Cut-up vegetables with  cup (60 g) of hummus.  8 oz (230 mL) or 1 cup of low-fat chocolate milk.  1 stick of string cheese with 1 medium orange.  1 peanut butter and jelly sandwich that is made with one slice of whole-wheat bread and 1 tsp (5 g) of peanut butter. For 300 extra calories, you could eat two of those healthy options each day. What is a healthy amount of weight to gain? The right amount of weight gain for you is based on your BMI before you became pregnant. If your BMI:  Was less than 18 (underweight), you should gain 28-40 lb (13-18 kg).  Was 18-24.9 (normal), you should gain 25-35 lb (11-16 kg).  Was 25-29.9 (overweight), you should gain 15-25 lb (7-11 kg).  Was 30 or greater (obese), you should gain 11-20 lb (5-9 kg). What if I am having twins or multiples? Generally, if you are carrying twins or multiples:  You may need to eat 300-600 extra calories a day.  The recommended range for total weight gain is 25-54 lb (11-25 kg), depending on your BMI before pregnancy.  Talk with your health care provider to find out about nutritional needs, weight gain, and exercise that is right for you. What foods can I eat?  Fruits All fruits. Eat a variety of colors and types of fruit. Remember to wash your fruits well before peeling or eating. Vegetables All vegetables. Eat a variety of colors and types of vegetables. Remember to wash your vegetables well before peeling or eating. Grains All grains. Choose whole grains, such as whole-wheat bread, oatmeal, or brown rice. Meats and other protein foods Lean meats, including chicken, [place], fish, and lean cuts of beef, veal, or pork. If you eat fish or seafood, choose options that are higher in omega-3 fatty acids and lower in mercury, such as salmon, herring, mussels, trout,  sardines, pollock, shrimp, crab, and lobster. Tofu. Tempeh. Beans. Eggs. Peanut butter and other nut butters. Make sure that all meats, poultry, and eggs are cooked to food-safe temperatures or "well-done." Two or more servings of fish are recommended each week in order to get the most benefits from omega-3 fatty acids that are found in seafood. Choose fish that are lower in mercury. You can find more information online:  PumpkinSearch.com.ee Dairy Pasteurized milk and milk alternatives (such as almond milk). Pasteurized yogurt and pasteurized cheese. Cottage cheese. Sour cream. Beverages Water. Juices that contain 100% fruit juice or vegetable juice. Caffeine-free teas and decaffeinated coffee. Drinks that contain caffeine are okay to drink, but it is better to avoid caffeine. Keep your total caffeine intake to less than 200 mg each day (which is 12 oz or 355 mL of coffee, tea, or soda) or the limit as told by your health care provider. Fats and oils Fats and oils are okay to include in moderation. Sweets and desserts Sweets and desserts are okay to include in moderation. Seasoning and other foods All pasteurized condiments. The items listed above may not be a complete list of foods and beverages you can eat. Contact a dietitian for more  information. What foods are not recommended? Fruits Unpasteurized fruit juices. Vegetables Raw (unpasteurized) vegetable juices. Meats and other protein foods Lunch meats, bologna, hot dogs, or other deli meats. (If you must eat those meats, reheat them until they are steaming hot.) Refrigerated pat, meat spreads from a meat counter, smoked seafood that is found in the refrigerated section of a store. Raw or undercooked meats, poultry, and eggs. Raw fish, such as sushi or sashimi. Fish that have high mercury content, such as tilefish, shark, swordfish, and king mackerel. To learn more about mercury in fish, talk with your health care provider or look for online  resources, such as:  PumpkinSearch.com.ee Dairy Raw (unpasteurized) milk and any foods that have raw milk in them. Soft cheeses, such as feta, queso blanco, queso fresco, Brie, Camembert cheeses, blue-veined cheeses, and Panela cheese (unless it is made with pasteurized milk, which must be stated on the label). Beverages Alcohol. Sugar-sweetened beverages, such as sodas, teas, or energy drinks. Seasoning and other foods Homemade fermented foods and drinks, such as pickles, sauerkraut, or kombucha drinks. (Store-bought pasteurized versions of these are okay.) Salads that are made in a store or deli, such as ham salad, chicken salad, egg salad, tuna salad, and seafood salad. The items listed above may not be a complete list of foods and beverages you should avoid. Contact a dietitian for more information. Where to find more information To calculate the number of calories you need based on your height, weight, and activity level, you can use an online calculator such as:  PackageNews.is To calculate how much weight you should gain during pregnancy, you can use an online pregnancy weight gain calculator such as:  http://jones-berg.com/ Summary  While you are pregnant, your body requires additional nutrition to help support your growing baby.  Eat a variety of foods, especially fruits and vegetables to get a full range of vitamins and minerals.  Practice good food safety and cleanliness. Wash your hands before you eat and after you prepare raw meat. Wash all fruits and vegetables well before peeling or eating. Taking these actions can help to prevent food-borne illnesses, such as listeriosis, that can be very dangerous to your baby.  Do not eat raw meat or fish. Do not eat fish that have high mercury content, such as tilefish, shark, swordfish, and king mackerel. Do not eat unpasteurized (raw) dairy.  Take a prenatal vitamin to help meet your  additional vitamin and mineral needs during pregnancy, specifically for folic acid, iron, calcium, and vitamin D. This information is not intended to replace advice given to you by your health care provider. Make sure you discuss any questions you have with your health care provider. Document Revised: 02/01/2019 Document Reviewed: 06/10/2017 Elsevier Patient Education  2020 Else[employer] Inc.        Healthy Weight Gain During Pregnancy, Adult A certain amount of weight gain during pregnancy is normal and healthy. How much weight you should gain depends on your overall health and a measurement called BMI (body mass index). BMI is an estimate of your body fat based on your height and weight. You can use an Software engineer to figure out your BMI, or you can ask your health care provider to calculate it for you at your next visit. Your recommended pregnancy weight gain is based on your pre-pregnancy BMI. General guidelines for a healthy total weight gain during pregnancy are listed below. If your BMI at or before the start of your pregnancy is:  Less than 18.5 (  underweight), you should gain 28-40 lb (13-18 kg).  18.5-24.9 (normal weight), you should gain 25-35 lb (11-16 kg).  25-29.9 (overweight), you should gain 15-25 lb (7-11 kg).  30 or higher (obese), you should gain 11-20 lb (5-9 kg). These ranges vary depending on your individual health. If you are carrying more than one baby (multiples), it may be safe to gain more weight than these recommendations. If you gain less weight than recommended, that may be safe as long as your baby is growing and developing normally. How can unhealthy weight gain affect me and my baby? Gaining too much weight during pregnancy can lead to pregnancy complications, such as:  A temporary form of diabetes that develops during pregnancy (gestational diabetes).  High blood pressure during pregnancy and protein in your urine (preeclampsia).  High blood pressure  during pregnancy without protein in your urine (gestational hypertension).  Your baby having a high weight at birth, which may: ? Raise your risk of having a more difficult delivery or a surgical delivery (cesarean delivery, or C-section). ? Raise your child's risk of developing obesity during childhood. Not gaining enough weight can be life-threatening for your baby, and it may raise your baby's chances of:  Being born early (preterm).  Growing more slowly than normal during pregnancy (growth restriction).  Having a low weight at birth. What actions can I take to gain a healthy amount of weight during pregnancy? General instructions  Keep track of your weight gain during pregnancy.  Take over-the-counter and prescription medicines only as told by your health care provider. Take all prenatal supplements as directed.  Keep all health care visits during pregnancy (prenatal visits). These visits are a good time to discuss your weight gain. Your health care provider will weigh you at each visit to make sure you are gaining a healthy amount of weight. Nutrition   Eat a balanced, nutrient-rich diet. Eat plenty of: ? Fruits and vegetables, such as berries and broccoli. ? Whole grains, such as millet, barley, whole-wheat breads and cereals, and oatmeal. ? Low-fat dairy products or non-dairy products such as almond milk or rice milk. ? Protein foods, such as lean meat, chicken, eggs, and legumes (such as peas, beans, soybeans, and lentils).  Avoid foods that are fried or have a lot of fat, salt (sodium), or sugar.  Drink enough fluid to keep your urine pale yellow.  Choose healthy snack and drink options when you are at work or on the go: ? Drink water. Avoid soda, sports drinks, and juices that have added sugar. ? Avoid drinks with caffeine, such as coffee and energy drinks. ? Eat snacks that are high in protein, such as nuts, protein bars, and low-fat yogurt. ? Carry convenient snacks  in your purse that do not need refrigeration, such as a pack of trail mix, an apple, or a granola bar.  If you need help improving your diet, work with a health care provider or a diet and nutrition specialist (dietitian). Activity   Exercise regularly, as told by your health care provider. ? If you were active before becoming pregnant, you may be able to continue your regular fitness activities. ? If you were not active before pregnancy, you may gradually build up to exercising for 30 or more minutes on most days of the week. This may include walking, swimming, or yoga.  Ask your health care provider what activities are safe for you. Talk with your health care provider about whether you may need to be excused  from certain school or work activities. Where to find more information Learn more about managing your weight gain during pregnancy from:  American Pregnancy Association: www.americanpregnancy.org  [place] Department of Agriculture pregnancy weight gain calculator: https://ball-collins.biz/ Summary  Too much weight gain during pregnancy can lead to complications for you and your baby.  Find out your pre-pregnancy BMI to determine how much weight gain is healthy for you.  Eat nutritious foods and stay active.  Keep all of your prenatal visits as told by your health care provider. This information is not intended to replace advice given to you by your health care provider. Make sure you discuss any questions you have with your health care provider. Document Revised: 06/06/2019 Document Reviewed: 06/03/2017 Elsevier Patient Education  2020 [employer].       First Trimester of Pregnancy The first trimester of pregnancy is from week 1 until the end of week 13 (months 1 through 3). A week after a sperm fertilizes an egg, the egg will implant on the wall of the uterus. This embryo will begin to develop into a baby. Genes from you and your partner will form the baby. The female genes will  determine whether the baby will be a boy or a girl. At 6-8 weeks, the eyes and face will be formed, and the heartbeat can be seen on ultrasound. At the end of 12 weeks, all the baby's organs will be formed. Now that you are pregnant, you will want to do everything you can to have a healthy baby. Two of the most important things are to get good prenatal care and to follow your health care provider's instructions. Prenatal care is all the medical care you receive before the baby's birth. This care will help prevent, find, and treat any problems during the pregnancy and childbirth. Body changes during your first trimester Your body goes through many changes during pregnancy. The changes vary from woman to woman.  You may gain or lose a couple of pounds at first.  You may feel sick to your stomach (nauseous) and you may throw up (vomit). If the vomiting is uncontrollable, call your health care provider.  You may tire easily.  You may develop headaches that can be relieved by medicines. All medicines should be approved by your health care provider.  You may urinate more often. Painful urination may mean you have a bladder infection.  You may develop heartburn as a result of your pregnancy.  You may develop constipation because certain hormones are causing the muscles that push stool through your intestines to slow down.  You may develop hemorrhoids or swollen veins (varicose veins).  Your breasts may begin to grow larger and become tender. Your nipples may stick out more, and the tissue that surrounds them (areola) may become darker.  Your gums may bleed and may be sensitive to brushing and flossing.  Dark spots or blotches (chloasma, mask of pregnancy) may develop on your face. This will likely fade after the baby is born.  Your menstrual periods will stop.  You may have a loss of appetite.  You may develop cravings for certain kinds of food.  You may have changes in your emotions from  day to day, such as being excited to be pregnant or being concerned that something may go wrong with the pregnancy and baby.  You may have more vivid and strange dreams.  You may have changes in your hair. These can include thickening of your hair, rapid growth, and changes  in texture. Some women also have hair loss during or after pregnancy, or hair that feels dry or thin. Your hair will most likely return to normal after your baby is born. What to expect at prenatal visits During a routine prenatal visit:  You will be weighed to make sure you and the baby are growing normally.  Your blood pressure will be taken.  Your abdomen will be measured to track your baby's growth.  The fetal heartbeat will be listened to between weeks 10 and 14 of your pregnancy.  Test results from any previous visits will be discussed. Your health care provider may ask you:  How you are feeling.  If you are feeling the baby move.  If you have had any abnormal symptoms, such as leaking fluid, bleeding, severe headaches, or abdominal cramping.  If you are using any tobacco products, including cigarettes, chewing tobacco, and electronic cigarettes.  If you have any questions. Other tests that may be performed during your first trimester include:  Blood tests to find your blood type and to check for the presence of any previous infections. The tests will also be used to check for low iron levels (anemia) and protein on red blood cells (Rh antibodies). Depending on your risk factors, or if you previously had diabetes during pregnancy, you may have tests to check for high blood sugar that affects pregnant women (gestational diabetes).  Urine tests to check for infections, diabetes, or protein in the urine.  An ultrasound to confirm the proper growth and development of the baby.  Fetal screens for spinal cord problems (spina bifida) and Down syndrome.  HIV (human immunodeficiency virus) testing. Routine  prenatal testing includes screening for HIV, unless you choose not to have this test.  You may need other tests to make sure you and the baby are doing well. Follow these instructions at home: Medicines  Follow your health care provider's instructions regarding medicine use. Specific medicines may be either safe or unsafe to take during pregnancy.  Take a prenatal vitamin that contains at least 600 micrograms (mcg) of folic acid.  If you develop constipation, try taking a stool softener if your health care provider approves. Eating and drinking   Eat a balanced diet that includes fresh fruits and vegetables, whole grains, good sources of protein such as meat, eggs, or tofu, and low-fat dairy. Your health care provider will help you determine the amount of weight gain that is right for you.  Avoid raw meat and uncooked cheese. These carry germs that can cause birth defects in the baby.  Eating four or five small meals rather than three large meals a day may help relieve nausea and vomiting. If you start to feel nauseous, eating a few soda crackers can be helpful. Drinking liquids between meals, instead of during meals, also seems to help ease nausea and vomiting.  Limit foods that are high in fat and processed sugars, such as fried and sweet foods.  To prevent constipation: ? Eat foods that are high in fiber, such as fresh fruits and vegetables, whole grains, and beans. ? Drink enough fluid to keep your urine clear or pale yellow. Activity  Exercise only as directed by your health care provider. Most women can continue their usual exercise routine during pregnancy. Try to exercise for 30 minutes at least 5 days a week. Exercising will help you: ? Control your weight. ? Stay in shape. ? Be prepared for labor and delivery.  Experiencing pain or cramping in  the lower abdomen or lower back is a good sign that you should stop exercising. Check with your health care provider before  continuing with normal exercises.  Try to avoid standing for long periods of time. Move your legs often if you must stand in one place for a long time.  Avoid heavy lifting.  Wear low-heeled shoes and practice good posture.  You may continue to have sex unless your health care provider tells you not to. Relieving pain and discomfort  Wear a good support bra to relieve breast tenderness.  Take warm sitz baths to soothe any pain or discomfort caused by hemorrhoids. Use hemorrhoid cream if your health care provider approves.  Rest with your legs elevated if you have leg cramps or low back pain.  If you develop varicose veins in your legs, wear support hose. Elevate your feet for 15 minutes, 3-4 times a day. Limit salt in your diet. Prenatal care  Schedule your prenatal visits by the twelfth week of pregnancy. They are usually scheduled monthly at first, then more often in the last 2 months before delivery.  Write down your questions. Take them to your prenatal visits.  Keep all your prenatal visits as told by your health care provider. This is important. Safety  Wear your seat belt at all times when driving.  Make a list of emergency phone numbers, including numbers for family, friends, the hospital, and police and fire departments. General instructions  Ask your health care provider for a referral to a local prenatal education class. Begin classes no later than the beginning of month 6 of your pregnancy.  Ask for help if you have counseling or nutritional needs during pregnancy. Your health care provider can offer advice or refer you to specialists for help with various needs.  Do not use hot tubs, steam rooms, or saunas.  Do not douche or use tampons or scented sanitary pads.  Do not cross your legs for long periods of time.  Avoid cat litter boxes and soil used by cats. These carry germs that can cause birth defects in the baby and possibly loss of the fetus by miscarriage  or stillbirth.  Avoid all smoking, herbs, alcohol, and medicines not prescribed by your health care provider. Chemicals in these products affect the formation and growth of the baby.  Do not use any products that contain nicotine or tobacco, such as cigarettes and e-cigarettes. If you need help quitting, ask your health care provider. You may receive counseling support and other resources to help you quit.  Schedule a dentist appointment. At home, brush your teeth with a soft toothbrush and be gentle when you floss. Contact a health care provider if:  You have dizziness.  You have mild pelvic cramps, pelvic pressure, or nagging pain in the abdominal area.  You have persistent nausea, vomiting, or diarrhea.  You have a bad smelling vaginal discharge.  You have pain when you urinate.  You notice increased swelling in your face, hands, legs, or ankles.  You are exposed to fifth disease or chickenpox.  You are exposed to [place] measles (rubella) and have never had it. Get help right away if:  You have a fever.  You are leaking fluid from your vagina.  You have spotting or bleeding from your vagina.  You have severe abdominal cramping or pain.  You have rapid weight gain or loss.  You vomit blood or material that looks like coffee grounds.  You develop a severe headache.  You  have shortness of breath.  You have any kind of trauma, such as from a fall or a car accident. Summary  The first trimester of pregnancy is from week 1 until the end of week 13 (months 1 through 3).  Your body goes through many changes during pregnancy. The changes vary from woman to woman.  You will have routine prenatal visits. During those visits, your health care provider will examine you, discuss any test results you may have, and talk with you about how you are feeling. This information is not intended to replace advice given to you by your health care provider. Make sure you discuss any  questions you have with your health care provider. Document Revised: 08/26/2017 Document Reviewed: 08/25/2016 Elsevier Patient Education  2020 Elsevier Inc.  Pregnancy and Anemia  Anemia is a condition in which the concentration of red blood cells, or hemoglobin, in the blood is below normal. Hemoglobin is a substance in red blood cells that carries oxygen to the tissues of the body. Anemia results when enough oxygen does not reach these tissues. Anemia is common during pregnancy because the woman's body needs more blood volume and blood cells to provide nutrition to the fetus. The fetus needs iron and folic acid as it is developing. Your body may not produce enough red blood cells because of this. Also, during pregnancy, the liquid part of the blood (plasma) increases by about 30-50%, and the red blood cells increase by only 20%. This lowers the concentration of the red blood cells and creates a natural anemia-like situation. What are the causes? The most common cause of anemia during pregnancy is not having enough iron in the body to make red blood cells (iron deficiency anemia). Other causes may include:  Folic acid deficiency.  Vitamin B12 deficiency.  Certain prescription or over-the-counter medicines.  Certain medical conditions or infections that destroy red blood cells.  A low platelet count and bleeding caused by antibodies that go through the placenta to the fetus from the mother's blood. What are the signs or symptoms? Mild anemia may not be noticeable. If it becomes severe, symptoms may include:  Feeling tired (fatigue).  Shortness of breath, especially during activity.  Weakness.  Fainting.  Pale looking skin.  Headaches.  A fast or irregular heartbeat (palpitations).  Dizziness. How is this diagnosed? This condition may be diagnosed based on:  Your medical history and a physical exam.  Blood tests. How is this treated? Treatment for anemia during pregnancy  depends on the cause of the anemia. Treatment can include:  Dietary changes.  Supplements of iron, vitamin B12, or folic acid.  A blood transfusion. This may be needed if anemia is severe.  Hospitalization. This may be needed if there is a lot of blood loss or severe anemia. Follow these instructions at home:  Follow recommendations from your dietitian or health care provider about changing your diet.  Increase your vitamin C intake. This will help the stomach absorb more iron. Some foods that are high in vitamin C include: ? Oranges. ? Peppers. ? Tomatoes. ? Mangoes.  Eat a diet rich in iron. This would include foods such as: ? Liver. ? Beef. ? Eggs. ? Whole grains. ? Spinach. ? Dried fruit.  Take iron and vitamins as told by your health care provider.  Eat green leafy vegetables. These are a good source of folic acid.  Keep all follow-up visits as told by your health care provider. This is important. Contact a health care  provider if:  You have frequent or lasting headaches.  You look pale.  You bruise easily. Get help right away if:  You have extreme weakness, shortness of breath, or chest pain.  You become dizzy or have trouble concentrating.  You have heavy vaginal bleeding.  You develop a rash.  You have bloody or black, tarry stools.  You faint.  You vomit up blood.  You vomit repeatedly.  You have abdominal pain.  You have a fever.  You are dehydrated. Summary  Anemia is a condition in which the concentration of red blood cells or hemoglobin in the blood is below normal.  Anemia is common during pregnancy because the woman's body needs more blood volume and blood cells to provide nutrition to the fetus.  The most common cause of anemia during pregnancy is not having enough iron in the body to make red blood cells (iron deficiency anemia).  Mild anemia may not be noticeable. If it becomes severe, symptoms may include feeling tired and  weak. This information is not intended to replace advice given to you by your health care provider. Make sure you discuss any questions you have with your health care provider. Document Revised: 02/27/2019 Document Reviewed: 10/19/2016 Elsevier Patient Education  2020 Elsevier Inc.      Subchorionic Hematoma  A subchorionic hematoma is a gathering of blood between the outer wall of the embryo (chorion) and the inner wall of the womb (uterus). This condition can cause vaginal bleeding. If they cause little or no vaginal bleeding, early small hematomas usually shrink on their own and do not affect your baby or pregnancy. When bleeding starts later in pregnancy, or if the hematoma is larger or occurs in older pregnant women, the condition may be more serious. Larger hematomas may get bigger, which increases the chances of miscarriage. This condition also increases the risk of:  Premature separation of the placenta from the uterus.  Premature (preterm) labor.  Stillbirth. What are the causes? The exact cause of this condition is not known. It occurs when blood is trapped between the placenta and the uterine wall because the placenta has separated from the original site of implantation. What increases the risk? You are more likely to develop this condition if:  You were treated with fertility medicines.  You conceived through in vitro fertilization (IVF). What are the signs or symptoms? Symptoms of this condition include:  Vaginal spotting or bleeding.  Contractions of the uterus. These cause abdominal pain. Sometimes you may have no symptoms and the bleeding may only be seen when ultrasound images are taken (transvaginal ultrasound). How is this diagnosed? This condition is diagnosed based on a physical exam. This includes a pelvic exam. You may also have other tests, including:  Blood tests.  Urine tests.  Ultrasound of the abdomen. How is this treated? Treatment for this  condition can vary. Treatment may include:  Watchful waiting. You will be monitored closely for any changes in bleeding. During this stage: ? The hematoma may be reabsorbed by the body. ? The hematoma may separate the fluid-filled space containing the embryo (gestational sac) from the wall of the womb (endometrium).  Medicines.  Activity restriction. This may be needed until the bleeding stops. Follow these instructions at home:  Stay on bed rest if told to do so by your health care provider.  Do not lift anything that is heavier than 10 lbs. (4.5 kg) or as told by your health care provider.  Do not use any  products that contain nicotine or tobacco, such as cigarettes and e-cigarettes. If you need help quitting, ask your health care provider.  Track and write down the number of pads you use each day and how soaked (saturated) they are.  Do not use tampons.  Keep all follow-up visits as told by your health care provider. This is important. Your health care provider may ask you to have follow-up blood tests or ultrasound tests or both. Contact a health care provider if:  You have any vaginal bleeding.  You have a fever. Get help right away if:  You have severe cramps in your stomach, back, abdomen, or pelvis.  You pass large clots or tissue. Save any tissue for your health care provider to look at.  You have more vaginal bleeding, and you faint or become lightheaded or weak. Summary  A subchorionic hematoma is a gathering of blood between the outer wall of the placenta and the uterus.  This condition can cause vaginal bleeding.  Sometimes you may have no symptoms and the bleeding may only be seen when ultrasound images are taken.  Treatment may include watchful waiting, medicines, or activity restriction. This information is not intended to replace advice given to you by your health care provider. Make sure you discuss any questions you have with your health care  provider. Document Revised: 08/26/2017 Document Reviewed: 11/09/2016 Elsevier Patient Education  2020 [employer].

## 2020-03-26 ENCOUNTER — Inpatient Hospital Stay (HOSPITAL_COMMUNITY)
Admission: AD | Admit: 2020-03-26 | Discharge: 2020-03-27 | Disposition: A | Discharge status: Home / Self Care | Payer: Medicaid Other | Attending: Obstetrics & Gynecology | Admitting: Obstetrics & Gynecology

## 2020-03-26 DIAGNOSIS — O209 Hemorrhage in early pregnancy, unspecified: Secondary | ICD-10-CM

## 2020-03-26 DIAGNOSIS — Z3A01 Less than 8 weeks gestation of pregnancy: Secondary | ICD-10-CM | POA: Insufficient documentation

## 2020-03-26 DIAGNOSIS — A749 Chlamydial infection, unspecified: Secondary | ICD-10-CM

## 2020-03-26 DIAGNOSIS — Z87891 Personal history of nicotine dependence: Secondary | ICD-10-CM | POA: Insufficient documentation

## 2020-03-26 DIAGNOSIS — O98819 Other maternal infectious and parasitic diseases complicating pregnancy, unspecified trimester: Secondary | ICD-10-CM

## 2020-03-26 DIAGNOSIS — Z3480 Encounter for supervision of other normal pregnancy, unspecified trimester: Secondary | ICD-10-CM

## 2020-03-26 DIAGNOSIS — A568 Sexually transmitted chlamydial infection of other sites: Secondary | ICD-10-CM | POA: Insufficient documentation

## 2020-03-26 DIAGNOSIS — O98311 Other infections with a predominantly sexual mode of transmission complicating pregnancy, first trimester: Secondary | ICD-10-CM | POA: Insufficient documentation

## 2020-03-26 DIAGNOSIS — Z881 Allergy status to other antibiotic agents status: Secondary | ICD-10-CM | POA: Insufficient documentation

## 2020-03-26 NOTE — MAU Note (Addendum)
Was called and told I had an std and to come in to be treated. Saw some blood yesterday when I wiped but none today. No pain or other concerns tonight

## 2020-03-27 DIAGNOSIS — Z881 Allergy status to other antibiotic agents status: Secondary | ICD-10-CM | POA: Diagnosis not present

## 2020-03-27 DIAGNOSIS — Z3A01 Less than 8 weeks gestation of pregnancy: Secondary | ICD-10-CM | POA: Diagnosis not present

## 2020-03-27 DIAGNOSIS — A749 Chlamydial infection, unspecified: Secondary | ICD-10-CM | POA: Diagnosis not present

## 2020-03-27 DIAGNOSIS — O98311 Other infections with a predominantly sexual mode of transmission complicating pregnancy, first trimester: Secondary | ICD-10-CM | POA: Diagnosis not present

## 2020-03-27 DIAGNOSIS — O98811 Other maternal infectious and parasitic diseases complicating pregnancy, first trimester: Secondary | ICD-10-CM | POA: Diagnosis not present

## 2020-03-27 DIAGNOSIS — Z202 Contact with and (suspected) exposure to infections with a predominantly sexual mode of transmission: Secondary | ICD-10-CM | POA: Diagnosis present

## 2020-03-27 DIAGNOSIS — O98819 Other maternal infectious and parasitic diseases complicating pregnancy, unspecified trimester: Secondary | ICD-10-CM

## 2020-03-27 DIAGNOSIS — A568 Sexually transmitted chlamydial infection of other sites: Secondary | ICD-10-CM | POA: Diagnosis not present

## 2020-03-27 DIAGNOSIS — Z87891 Personal history of nicotine dependence: Secondary | ICD-10-CM | POA: Diagnosis not present

## 2020-03-27 MED ORDER — AZITHROMYCIN 250 MG PO TABS
1000.0000 mg | ORAL_TABLET | Freq: Once | ORAL | Status: AC
  Administered 2020-03-27: 1000 mg via ORAL
  Filled 2020-03-27: qty 4

## 2020-03-27 NOTE — Progress Notes (Signed)
Shelby Duncan CNM discussed d/c plan with pt. Written and verbal d/c instructions given and understanding voiced. 

## 2020-03-27 NOTE — MAU Note (Signed)
Marie Williams CNM in Triage to see pt. 

## 2020-03-27 NOTE — MAU Provider Note (Signed)
Chief Complaint: Exposure to STD   First Provider Initiated Contact with Patient 03/27/20 0016        SUBJECTIVE HPI: Shelby Duncan is a 29 y.o. [id number] at [redacted]w[redacted]d by LMP who presents to maternity admissions reporting wanting treatment for chlamydia.  Was notified yesterday and told how to get treatment, but wanted to come here instead. . She denies vaginal bleeding, vaginal itching/burning, urinary symptoms, h/a, dizziness, n/v, or fever/chills.    Exposure to STD  The patient's pertinent negatives include no discharge, dyspareunia, dysuria, genital lesions, genital rash or pelvic pain. Primary symptoms comment: spotting yesterday. This is a new problem. The current episode started in the past 7 days. The problem has been unchanged. Pertinent negatives include no abdominal pain, diaphoresis, fever or urinary frequency. She has tried nothing for the symptoms.   RN Note: Was called and told I had an std and to come in to be treated. Saw some blood yesterday when I wiped but none today. No pain or other concerns tonight  Past Medical History:  Diagnosis Date  . Depression   . Infection    UTI  . OCD (obsessive compulsive disorder)   . PTSD (post-traumatic stress disorder)    Past Surgical History:  Procedure Laterality Date  . CHOLECYSTECTOMY N/A 09/17/2019   Procedure: LAPAROSCOPIC CHOLECYSTECTOMY WITH INTRAOPERATIVE CHOLANGIOGRAM;  Surgeon: Manus Rudd, MD;  Location: [facility] OR;  Service: General;  Laterality: N/A;  . NO PAST SURGERIES     Social History   Socioeconomic History  . Marital status: Single    Spouse name: Not on file  . Number of children: Not on file  . Years of education: Not on file  . Highest education level: Not on file  Occupational History  . Not on file  Tobacco Use  . Smoking status: Former Smoker    Types: E-cigarettes    Quit date: 09/16/2019    Years since quitting: 0.5  . Smokeless tobacco: Never Used  . Tobacco comment: age 55  Vaping Use  .  Vaping Use: Never used  Substance and Sexual Activity  . Alcohol use: No  . Drug use: Yes    Types: Marijuana    Comment: quite several weeks ago  . Sexual activity: Yes    Birth control/protection: None  Other Topics Concern  . Not on file  Social History Narrative  . Not on file   Social Determinants of Health   Financial Resource Strain:   . Difficulty of Paying Living Expenses:   Food Insecurity:   . Worried About Programme researcher, broadcasting/film/video in the Last Year:   . Barista in the Last Year:   Transportation Needs:   . Freight forwarder (Medical):   Marland Kitchen Lack of Transportation (Non-Medical):   Physical Activity:   . Days of Exercise per Week:   . Minutes of Exercise per Session:   Stress:   . Feeling of Stress :   Social Connections:   . Frequency of Communication with Friends and Family:   . Frequency of Social Gatherings with Friends and Family:   . Attends Religious Services:   . Active Member of Clubs or Organizations:   . Attends Banker Meetings:   Marland Kitchen Marital Status:   Intimate Partner Violence:   . Fear of Current or Ex-Partner:   . Emotionally Abused:   Marland Kitchen Physically Abused:   . Sexually Abused:    No current facility-administered medications on file prior to encounter.  Current Outpatient Medications on File Prior to Encounter  Medication Sig Dispense Refill  . acetaminophen (TYLENOL) 325 MG tablet Take 2 tablets (650 mg total) by mouth every 6 (six) hours as needed for mild pain. (Patient not taking: Reported on 03/18/2020)    . ferrous sulfate 325 (65 FE) MG tablet Take 1 tablet (325 mg total) by mouth daily. 30 tablet 3  . ondansetron (ZOFRAN-ODT) 4 MG disintegrating tablet Take 1 tablet (4 mg total) by mouth every 6 (six) hours as needed for nausea. (Patient not taking: Reported on 03/18/2020) 15 tablet 0  . oxyCODONE (OXY IR/ROXICODONE) 5 MG immediate release tablet Take 1 tablet (5 mg total) by mouth every 6 (six) hours as needed for severe  pain. (Patient not taking: Reported on 03/18/2020) 15 tablet 0   Allergies  Allergen Reactions  . Chocolate Anaphylaxis and Rash  . 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    . Metrogel [Metronidazole] Swelling and Other (See Comments)    Reaction:  Swelling of vulva   . Shellfish Allergy Swelling and Rash    I have reviewed patient's Past Medical Hx, Surgical Hx, Family Hx, Social Hx, medications and allergies.   ROS:  Review of Systems  Constitutional: Negative for diaphoresis and fever.  Gastrointestinal: Negative for abdominal pain.  Genitourinary: Negative for dyspareunia, dysuria, frequency and pelvic pain.   Review of Systems  Other systems negative   Physical Exam  Physical Exam Patient Vitals for the past 24 hrs:  BP Temp Pulse Resp Height Weight  03/26/20 2327 (!) 122/59 98 F (36.7 C) 83 16 5\' 9"  (1.753 m) 90.7 kg   Constitutional: Well-developed, well-nourished female in no acute distress.  Cardiovascular: normal rate Respiratory: normal effort GI: Abd soft, non-tender.  MS: Extremities nontender, no edema, normal ROM Neurologic: Alert and oriented x 4.   PELVIC EXAM: deferred  LAB RESULTS No results found for this or any previous visit (from the past 24 hour(s)).     IMAGING  MAU Management/MDM: Zithromax 1gm PO given Rx given for Expedited Partner Therapy program for her partner This has been fully explained to the patient, who indicates understanding.  Expedited Partner Therapy (EPT) is the clinical practice of treating the sexual partners of persons who receive chlamydia, gonorrhea, or trichomoniasis diagnoses by providing medications or prescriptions to the patient. Patients then provide partners with these therapies without the health-care provider having examined the partner. In other words, EPT is a convenient, fast and private way for patients to help their sexual partners get treated.     ASSESSMENT Single IUP at [redacted]w[redacted]d Chlamydia infection  PLAN Discharge home Treated with Zithromax per CDC recommendations Rx given for Expedited Partner Therapy for her partner  Pt stable at time of discharge. Encouraged to return here or to other Urgent Care/ED if she develops worsening of symptoms, increase in pain, fever, or other concerning symptoms.    [redacted]w[redacted]d CNM, MSN Certified Nurse-Midwife 03/27/2020  12:16 AM

## 2020-03-27 NOTE — Discharge Instructions (Signed)
Chlamydia, Female Chlamydia is an STD (sexually transmitted disease). It is a bacterial infection that spreads (is contagious) through sexual contact. Chlamydia can occur in different areas of the body, including:  The tube that moves urine from the bladder out of the body (urethra).  The lower part of the uterus (cervix).  The throat.  The rectum. This condition is not difficult to treat. However, if left untreated, chlamydia can lead to more serious health problems, including pelvic inflammatory disorder (PID). PID can increase your risk of not being able to have children (sterility). Also, if chlamydia is left untreated and you are pregnant or become pregnant, there is a chance that your baby can become infected during delivery. This may cause serious health problems for the baby. What are the causes? Chlamydia is caused by the bacteria Chlamydia trachomatis. It is passed from an infected partner during sexual activity. Chlamydia can spread through contact with the genitals, mouth, or rectum. What are the signs or symptoms? In some cases, there may not be any symptoms for this condition (asymptomatic), especially early in the infection. If symptoms develop, they may include:  Burning with urination.  Frequent urination.  Vaginal discharge.  Redness, soreness, and swelling (inflammation) of the rectum.  Bleeding or discharge from the rectum.  Abdominal pain.  Pain during sexual intercourse.  Bleeding between menstrual periods.  Itching, burning, or redness in the eyes, or discharge from the eyes. How is this diagnosed? This condition may be diagnosed with:  Urine tests.  Swab tests. Depending on your symptoms, your health care provider may use a cotton swab to collect discharge from your vagina or rectum to test for the bacteria.  A pelvic exam. How is this treated? This condition is treated with antibiotic medicines. If you are pregnant, certain types of antibiotics will  need to be avoided. Follow these instructions at home: Medicines  Take over-the-counter and prescription medicines only as told by your health care provider.  Take your antibiotic medicine as told by your health care provider. Do not stop taking the antibiotic even if you start to feel better. Sexual activity  Tell sexual partners about your infection. This includes any oral, anal, or vaginal sex partners you have had within 60 days of when your symptoms started. Sexual partners should also be treated, even if they have no signs of the disease.  Do not have sex until you and your sexual partners have completed treatment and your health care provider says it is okay. If your health care provider prescribed you a single dose treatment, wait 7 days after taking the treatment before having sex. General instructions  It is your responsibility to get your test results. Ask your health care provider, or the department performing the test, when your results will be ready.  Get plenty of rest.  Eat a healthy, well-balanced diet.  Drink enough fluids to keep your urine clear or pale yellow.  Keep all follow-up visits as told by your health care provider. This is important. You may need to be tested for infection again 3 months after treatment. How is this prevented? The only sure way to prevent chlamydia is to avoid having sex. However, you can lower your risk by:  Using latex condoms correctly every time you have sex.  Not having multiple sexual partners.  Asking if your sexual partner has been tested for STIs and had negative results. Contact a health care provider if:  You develop new symptoms or your symptoms do not get   better after completing treatment.  You have a fever or chills.  You have pain during sexual intercourse. Get help right away if:  Your pain gets worse and does not get better with medicine.  You develop flu-like symptoms, such as night sweats, sore throat, or  muscle aches.  You experience nausea or vomiting.  You have difficulty swallowing.  You have bleeding between periods or after sex.  You have irregular menstrual periods.  You have abdominal or lower back pain that does not get better with medicine.  You feel weak or dizzy, or you faint.  You are pregnant and you develop symptoms of chlamydia. Summary  Chlamydia is an STD (sexually transmitted disease). It is a bacterial infection that spreads (is contagious) through sexual contact.  This condition is not difficult to treat, however. If left untreated, chlamydia can lead to more serious health problems, including pelvic inflammatory disease (PID).  In some cases, there may not be any symptoms for this condition (asymptomatic).  This condition is treated with antibiotic medicines.  Using latex condoms correctly every time you have sex can help prevent chlamydia. This information is not intended to replace advice given to you by your health care provider. Make sure you discuss any questions you have with your health care provider. Document Revised: 03/07/2018 Document Reviewed: 08/30/2016 Elsevier Patient Education  2020 [employer].  Safe Sex Practicing safe sex means taking steps before and during sex to reduce your risk of:  Getting an STI (sexually transmitted infection).  Giving your partner an STI.  Unwanted or unplanned pregnancy. How can I practice safe sex?     Ways you can practice safe sex  Limit your sexual partners to only one partner who is having sex with only you.  Avoid using alcohol and drugs before having sex. Alcohol and drugs can affect your judgment.  Before having sex with a new partner: ? Talk to your partner about past partners, past STIs, and drug use. ? Get screened for STIs and discuss the results with your partner. Ask your partner to get screened, too.  Check your body regularly for sores, blisters, rashes, or unusual discharge. If you  notice any of these problems, visit your health care provider.  Avoid sexual contact if you have symptoms of an infection or you are being treated for an STI.  While having sex, use a condom. Make sure to: ? Use a condom every time you have vaginal, oral, or anal sex. Both females and males should wear condoms during oral sex. ? Keep condoms in place from the beginning to the end of sexual activity. ? Use a latex condom, if possible. Latex condoms offer the best protection. ? Use only water-based lubricants with a condom. Using petroleum-based lubricants or oils will weaken the condom and increase the chance that it will break. Ways your health care provider can help you practice safe sex  See your health care provider for regular screenings, exams, and tests for STIs.  Talk with your health care provider about what kind of birth control (contraception) is best for you.  Get vaccinated against hepatitis B and human papillomavirus (HPV).  If you are at risk of being infected with HIV (human immunodeficiency virus), talk with your health care provider about taking a prescription medicine to prevent HIV infection. You are at risk for HIV if you: ? Are a man who has sex with other men. ? Are sexually active with more than one partner. ? Take drugs by  injection. ? Have a sex partner who has HIV. ? Have unprotected sex. ? Have sex with someone who has sex with both men and women. ? Have had an STI. Follow these instructions at home:  Take over-the-counter and prescription medicines as told by your health care provider.  Keep all follow-up visits as told by your health care provider. This is important. Where to find more information  Centers for Disease Control and Prevention: LessFurniture.be  Planned Parenthood: https://www.plannedparenthood.org/  Office on Women's Health:  EmploymentTracking.tn Summary  Practicing safe sex means taking steps before and during sex to reduce your risk of STIs, giving your partner STIs, and having an unwanted or unplanned pregnancy.  Before having sex with a new partner, talk to your partner about past partners, past STIs, and drug use.  Use a condom every time you have vaginal, oral, or anal sex. Both females and males should wear condoms during oral sex.  Check your body regularly for sores, blisters, rashes, or unusual discharge. If you notice any of these problems, visit your health care provider.  See your health care provider for regular screenings, exams, and tests for STIs. This information is not intended to replace advice given to you by your health care provider. Make sure you discuss any questions you have with your health care provider. Document Revised: 01/05/2019 Document Reviewed: 06/26/2018 Elsevier Patient Education  2020 [employer].

## 2020-04-08 ENCOUNTER — Ambulatory Visit (INDEPENDENT_AMBULATORY_CARE_PROVIDER_SITE_OTHER): Payer: Medicaid Other | Admitting: Obstetrics and Gynecology

## 2020-04-08 ENCOUNTER — Encounter: Payer: Self-pay | Admitting: Obstetrics and Gynecology

## 2020-04-08 ENCOUNTER — Other Ambulatory Visit: Payer: Self-pay

## 2020-04-08 ENCOUNTER — Other Ambulatory Visit (HOSPITAL_COMMUNITY)
Admission: RE | Admit: 2020-04-08 | Discharge: 2020-04-08 | Disposition: A | Discharge status: Home / Self Care | Payer: Medicaid Other | Source: Ambulatory Visit | Attending: Obstetrics and Gynecology | Admitting: Obstetrics and Gynecology

## 2020-04-08 VITALS — BP 117/69 | HR 78 | Wt 198.0 lb

## 2020-04-08 DIAGNOSIS — A749 Chlamydial infection, unspecified: Secondary | ICD-10-CM

## 2020-04-08 DIAGNOSIS — Z3480 Encounter for supervision of other normal pregnancy, unspecified trimester: Secondary | ICD-10-CM | POA: Insufficient documentation

## 2020-04-08 DIAGNOSIS — Z3A08 8 weeks gestation of pregnancy: Secondary | ICD-10-CM

## 2020-04-08 DIAGNOSIS — F32A Depression, unspecified: Secondary | ICD-10-CM

## 2020-04-08 DIAGNOSIS — O99341 Other mental disorders complicating pregnancy, first trimester: Secondary | ICD-10-CM | POA: Diagnosis not present

## 2020-04-08 DIAGNOSIS — O98811 Other maternal infectious and parasitic diseases complicating pregnancy, first trimester: Secondary | ICD-10-CM

## 2020-04-08 DIAGNOSIS — O98819 Other maternal infectious and parasitic diseases complicating pregnancy, unspecified trimester: Secondary | ICD-10-CM

## 2020-04-08 DIAGNOSIS — F329 Major depressive disorder, single episode, unspecified: Secondary | ICD-10-CM

## 2020-04-08 DIAGNOSIS — F209 Schizophrenia, unspecified: Secondary | ICD-10-CM | POA: Diagnosis not present

## 2020-04-08 DIAGNOSIS — O219 Vomiting of pregnancy, unspecified: Secondary | ICD-10-CM

## 2020-04-08 MED ORDER — DOXYLAMINE-PYRIDOXINE 10-10 MG PO TBEC: 2.0000 | DELAYED_RELEASE_TABLET | Freq: Every day | ORAL | 5 refills | Status: DC

## 2020-04-08 NOTE — Progress Notes (Signed)
Please order PNV Rx. Pt states N&V- states she is always sick.   Please discuss work with pt.  States she is on her feet all day and get very tired and lightheaded.   FHR obtained by u/s today.

## 2020-04-08 NOTE — Patient Instructions (Signed)
Morning Sickness  Morning sickness is when you feel sick to your stomach (nauseous) during pregnancy. You may feel sick to your stomach and throw up (vomit). You may feel sick in the morning, but you can feel this way at any time of day. Some women feel very sick to their stomach and cannot stop throwing up (hyperemesis gravidarum). Follow these instructions at home: Medicines  Take over-the-counter and prescription medicines only as told by your doctor. Do not take any medicines until you talk with your doctor about them first.  Taking multivitamins before getting pregnant can stop or lessen the harshness of morning sickness. Eating and drinking  Eat dry toast or crackers before getting out of bed.  Eat 5 or 6 small meals a day.  Eat dry and bland foods like rice and baked potatoes.  Do not eat greasy, fatty, or spicy foods.  Have someone cook for you if the smell of food causes you to feel sick or throw up.  If you feel sick to your stomach after taking prenatal vitamins, take them at night or with a snack.  Eat protein when you need a snack. Nuts, yogurt, and cheese are good choices.  Drink fluids throughout the day.  Try ginger ale made with real ginger, ginger tea made from fresh grated ginger, or ginger candies. General instructions  Do not use any products that have nicotine or tobacco in them, such as cigarettes and e-cigarettes. If you need help quitting, ask your doctor.  Use an air purifier to keep the air in your house free of smells.  Get lots of fresh air.  Try to avoid smells that make you feel sick.  Try: ? Wearing a bracelet that is used for seasickness (acupressure wristband). ? Going to a doctor who puts thin needles into certain body points (acupuncture) to improve how you feel. Contact a doctor if:  You need medicine to feel better.  You feel dizzy or light-headed.  You are losing weight. Get help right away if:  You feel very sick to your  stomach and cannot stop throwing up.  You pass out (faint).  You have very bad pain in your belly. Summary  Morning sickness is when you feel sick to your stomach (nauseous) during pregnancy.  You may feel sick in the morning, but you can feel this way at any time of day.  Making some changes to what you eat may help your symptoms go away. This information is not intended to replace advice given to you by your health care provider. Make sure you discuss any questions you have with your health care provider. Document Revised: 08/26/2017 Document Reviewed: 10/14/2016 Elsevier Patient Education  [place] of Pregnancy  The first trimester of pregnancy is from week 1 until the end of week 13 (months 1 through 3). During this time, your baby will begin to develop inside you. At 6-8 weeks, the eyes and face are formed, and the heartbeat can be seen on ultrasound. At the end of 12 weeks, all the baby's organs are formed. Prenatal care is all the medical care you receive before the birth of your baby. Make sure you get good prenatal care and follow all of your doctor's instructions. Follow these instructions at home: Medicines  Take over-the-counter and prescription medicines only as told by your doctor. Some medicines are safe and some medicines are not safe during pregnancy.  Take a prenatal vitamin that contains at least 600 micrograms (mcg) of  folic acid.  If you have trouble pooping (constipation), take medicine that will make your stool soft (stool softener) if your doctor approves. Eating and drinking   Eat regular, healthy meals.  Your doctor will tell you the amount of weight gain that is right for you.  Avoid raw meat and uncooked cheese.  If you feel sick to your stomach (nauseous) or throw up (vomit): ? Eat 4 or 5 small meals a day instead of 3 large meals. ? Try eating a few soda crackers. ? Drink liquids between meals instead of during  meals.  To prevent constipation: ? Eat foods that are high in fiber, like fresh fruits and vegetables, whole grains, and beans. ? Drink enough fluids to keep your pee (urine) clear or pale yellow. Activity  Exercise only as told by your doctor. Stop exercising if you have cramps or pain in your lower belly (abdomen) or low back.  Do not exercise if it is too hot, too humid, or if you are in a place of great height (high altitude).  Try to avoid standing for long periods of time. Move your legs often if you must stand in one place for a long time.  Avoid heavy lifting.  Wear low-heeled shoes. Sit and stand up straight.  You can have sex unless your doctor tells you not to. Relieving pain and discomfort  Wear a good support bra if your breasts are sore.  Take warm water baths (sitz baths) to soothe pain or discomfort caused by hemorrhoids. Use hemorrhoid cream if your doctor says it is okay.  Rest with your legs raised if you have leg cramps or low back pain.  If you have puffy, bulging veins (varicose veins) in your legs: ? Wear support hose or compression stockings as told by your doctor. ? Raise (elevate) your feet for 15 minutes, 3-4 times a day. ? Limit salt in your food. Prenatal care  Schedule your prenatal visits by the twelfth week of pregnancy.  Write down your questions. Take them to your prenatal visits.  Keep all your prenatal visits as told by your doctor. This is important. Safety  Wear your seat belt at all times when driving.  Make a list of emergency phone numbers. The list should include numbers for family, friends, the hospital, and police and fire departments. General instructions  Ask your doctor for a referral to a local prenatal class. Begin classes no later than at the start of month 6 of your pregnancy.  Ask for help if you need counseling or if you need help with nutrition. Your doctor can give you advice or tell you where to go for help.  Do  not use hot tubs, steam rooms, or saunas.  Do not douche or use tampons or scented sanitary pads.  Do not cross your legs for long periods of time.  Avoid all herbs and alcohol. Avoid drugs that are not approved by your doctor.  Do not use any tobacco products, including cigarettes, chewing tobacco, and electronic cigarettes. If you need help quitting, ask your doctor. You may get counseling or other support to help you quit.  Avoid cat litter boxes and soil used by cats. These carry germs that can cause birth defects in the baby and can cause a loss of your baby (miscarriage) or stillbirth.  Visit your dentist. At home, brush your teeth with a soft toothbrush. Be gentle when you floss. Contact a doctor if:  You are dizzy.  You have mild  cramps or pressure in your lower belly.  You have a nagging pain in your belly area.  You continue to feel sick to your stomach, you throw up, or you have watery poop (diarrhea).  You have a bad smelling fluid coming from your vagina.  You have pain when you pee (urinate).  You have increased puffiness (swelling) in your face, hands, legs, or ankles. Get help right away if:  You have a fever.  You are leaking fluid from your vagina.  You have spotting or bleeding from your vagina.  You have very bad belly cramping or pain.  You gain or lose weight rapidly.  You throw up blood. It may look like coffee grounds.  You are around people who have [place] measles, fifth disease, or chickenpox.  You have a very bad headache.  You have shortness of breath.  You have any kind of trauma, such as from a fall or a car accident. Summary  The first trimester of pregnancy is from week 1 until the end of week 13 (months 1 through 3).  To take care of yourself and your unborn baby, you will need to eat healthy meals, take medicines only if your doctor tells you to do so, and do activities that are safe for you and your baby.  Keep all follow-up  visits as told by your doctor. This is important as your doctor will have to ensure that your baby is healthy and growing well. This information is not intended to replace advice given to you by your health care provider. Make sure you discuss any questions you have with your health care provider. Document Revised: 01/04/2019 Document Reviewed: 09/21/2016 Elsevier Patient Education  2020 [employer].

## 2020-04-08 NOTE — Progress Notes (Signed)
INITIAL PRENATAL VISIT NOTE  Subjective:  Shelby Duncan is a 29 y.o. [phone] at [redacted]w[redacted]d by ultrasound being seen today for her initial prenatal visit.  She has an obstetric history significant for depression and current chlamydial infection. She has a medical history significant for schizophrenia and depression..  Patient reports increased fatigue and light headedness.  Contractions: Not present. Vag. Bleeding: None.   . Denies leaking of fluid.    Past Medical History:  Diagnosis Date  . Depression   . Infection    UTI  . OCD (obsessive compulsive disorder)   . PTSD (post-traumatic stress disorder)     Past Surgical History:  Procedure Laterality Date  . CHOLECYSTECTOMY N/A 09/17/2019   Procedure: LAPAROSCOPIC CHOLECYSTECTOMY WITH INTRAOPERATIVE CHOLANGIOGRAM;  Surgeon: Manus Rudd, MD;  Location: [facility] OR;  Service: General;  Laterality: N/A;  . NO PAST SURGERIES      OB History  Gravida Para Term Preterm AB Living  4 2 2  0 1 2  SAB TAB Ectopic Multiple Live Births  1 0 0 0 2    # Outcome Date GA Lbr Len/2nd Weight Sex Delivery Anes PTL Lv  4 Current           3 Term 06/30/16 [redacted]w[redacted]d 10:13 / 00:07 6 lb 14.9 oz (3.145 kg) F Vag-Spont None  LIV  2 SAB 07/31/15          1 Term 05/15/09 [redacted]w[redacted]d  7 lb 2.3 oz (3.24 kg) M Vag-Spont None N LIV    Social History   Socioeconomic History  . Marital status: Single    Spouse name: Not on file  . Number of children: Not on file  . Years of education: Not on file  . Highest education level: Not on file  Occupational History  . Not on file  Tobacco Use  . Smoking status: Former Smoker    Types: E-cigarettes    Quit date: 09/16/2019    Years since quitting: 0.5  . Smokeless tobacco: Never Used  . Tobacco comment: age 68  Vaping Use  . Vaping Use: Never used  Substance and Sexual Activity  . Alcohol use: No  . Drug use: Yes    Types: Marijuana    Comment: quite several weeks ago  . Sexual activity: Yes    Birth  control/protection: None  Other Topics Concern  . Not on file  Social History Narrative  . Not on file   Social Determinants of Health   Financial Resource Strain:   . Difficulty of Paying Living Expenses:   Food Insecurity:   . Worried About 19 in the Last Year:   . Programme researcher, broadcasting/film/video in the Last Year:   Transportation Needs:   . Barista (Medical):   Freight forwarder Lack of Transportation (Non-Medical):   Physical Activity:   . Days of Exercise per Week:   . Minutes of Exercise per Session:   Stress:   . Feeling of Stress :   Social Connections:   . Frequency of Communication with Friends and Family:   . Frequency of Social Gatherings with Friends and Family:   . Attends Religious Services:   . Active Member of Clubs or Organizations:   . Attends [employer] Meetings:   Banker Marital Status:     Family History  Problem Relation Age of Onset  . Cancer Maternal Grandmother        breast  . Cancer Paternal Grandfather  Current Outpatient Medications:  .  acetaminophen (TYLENOL) 325 MG tablet, Take 2 tablets (650 mg total) by mouth every 6 (six) hours as needed for mild pain. (Patient not taking: Reported on 03/18/2020), Disp:  , Rfl:  .  ferrous sulfate 325 (65 FE) MG tablet, Take 1 tablet (325 mg total) by mouth daily. (Patient not taking: Reported on 04/08/2020), Disp: 30 tablet, Rfl: 3 .  ondansetron (ZOFRAN-ODT) 4 MG disintegrating tablet, Take 1 tablet (4 mg total) by mouth every 6 (six) hours as needed for nausea. (Patient not taking: Reported on 03/18/2020), Disp: 15 tablet, Rfl: 0  Allergies  Allergen Reactions  . Chocolate Anaphylaxis and Rash  . 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    . Metrogel [Metronidazole] Swelling and Other (See Comments)    Reaction:  Swelling of vulva   . Shellfish Allergy Swelling and Rash    Review of Systems: Negative except for what is mentioned in  HPI.  Objective:   Vitals:   04/08/20 1516  BP: 117/69  Pulse: 78  Weight: 198 lb (89.8 kg)    Fetal Status: Fetal Heart Rate (bpm): 178         Physical Exam: BP 117/69   Pulse 78   Wt 198 lb (89.8 kg)   LMP 01/21/2020   BMI 29.24 kg/m  CONSTITUTIONAL: Well-developed, well-nourished female in no acute distress.  NEUROLOGIC: Alert and oriented to person, place, and time. Normal reflexes, muscle tone coordination. No cranial nerve deficit noted. PSYCHIATRIC: Normal mood and affect. Normal behavior. Normal judgment and thought content. SKIN: Skin is warm and dry. No rash noted. Not diaphoretic. No erythema. No pallor. HENT:  Normocephalic, atraumatic, External right and left ear normal. Oropharynx is clear and moist EYES: Conjunctivae and EOM are normal. Pupils are equal, round, and reactive to light. No scleral icterus.  NECK: Normal range of motion, supple, no masses CARDIOVASCULAR: Normal heart rate noted, regular rhythm RESPIRATORY: Effort and breath sounds normal, no problems with respiration noted ABDOMEN: Soft, nontender, nondistended, gravid. GU: normal appearing external female genitalia, multiparous, normal appearing cervix, scant white discharge in vagina, no lesions noted Bimanual: 8 weeks sized uterus, no adnexal tenderness or palpable lesions noted MUSCULOSKELETAL: Normal range of motion. EXT:  No edema and no tenderness. 2+ distal pulses.   Assessment and Plan:  Pregnancy: [id number] at [redacted]w[redacted]d by ultrasound  1. Chlamydia infection, current pregnancy TOC at next visit in 4 weeks  2. Supervision of other normal pregnancy, antepartum Nausea and vomiting noted, rx for diclegis given, appears to already have had rx for zofran Routine labs ordered 3. Pregnancy in multigravida   4. Schizophrenia, unspecified type (HCC) Offered behavorial health counseling, but pt declined at this time  5. Depression during pregnancy in first trimester Pt declines BH  counseling   Preterm labor symptoms and general obstetric precautions including but not limited to vaginal bleeding, contractions, leaking of fluid and fetal movement were reviewed in detail with the patient.  Please refer to After Visit Summary for other counseling recommendations.   Return in about 4 weeks (around 05/06/2020) for [facility], in person, test of cure next visit.  Warden Fillers 04/08/2020 4:12 PM

## 2020-04-09 ENCOUNTER — Other Ambulatory Visit: Payer: Self-pay

## 2020-04-09 LAB — CBC/D/PLT+RPR+RH+ABO+RUB AB...
Antibody Screen: NEGATIVE
Basophils Absolute: 0 10*3/uL (ref 0.0–0.2)
Basos: 0 %
EOS (ABSOLUTE): 0.1 10*3/uL (ref 0.0–0.4)
Eos: 1 %
HCV Ab: <0.1 s/co ratio (ref 0.0–0.9)
HIV Screen 4th Generation wRfx: NONREACTIVE
Hematocrit: 32.4 % — ABNORMAL LOW (ref 34.0–46.6)
Hemoglobin: 9.9 g/dL — ABNORMAL LOW (ref 11.1–15.9)
Hepatitis B Surface Ag: NEGATIVE
Immature Grans (Abs): 0 10*3/uL (ref 0.0–0.1)
Immature Granulocytes: 0 %
Lymphocytes Absolute: 2 10*3/uL (ref 0.7–3.1)
Lymphs: 15 %
MCH: 24.6 pg — ABNORMAL LOW (ref 26.6–33.0)
MCHC: 30.6 g/dL — ABNORMAL LOW (ref 31.5–35.7)
MCV: 81 fL (ref 79–97)
Monocytes Absolute: 0.6 10*3/uL (ref 0.1–0.9)
Monocytes: 4 %
Neutrophils Absolute: 10.3 10*3/uL — ABNORMAL HIGH (ref 1.4–7.0)
Neutrophils: 80 %
Platelets: 358 10*3/uL (ref 150–450)
RBC: 4.02 x10E6/uL (ref 3.77–5.28)
RDW: 14.6 % (ref 11.7–15.4)
RPR Ser Ql: NONREACTIVE
Rh Factor: POSITIVE
Rubella Antibodies, IGG: 1.34 index (ref >0.99)
WBC: 13 10*3/uL — ABNORMAL HIGH (ref 3.4–10.8)

## 2020-04-09 LAB — HCV INTERPRETATION

## 2020-04-10 ENCOUNTER — Other Ambulatory Visit: Payer: Self-pay

## 2020-04-10 ENCOUNTER — Telehealth: Payer: Self-pay

## 2020-04-10 DIAGNOSIS — Z3480 Encounter for supervision of other normal pregnancy, unspecified trimester: Secondary | ICD-10-CM

## 2020-04-10 LAB — CULTURE, OB URINE

## 2020-04-10 LAB — URINE CULTURE, OB REFLEX

## 2020-04-10 MED ORDER — FERROUS SULFATE 325 (65 FE) MG PO TABS: 325.0000 mg | ORAL_TABLET | Freq: Every day | ORAL | 3 refills | Status: DC

## 2020-04-10 NOTE — Telephone Encounter (Signed)
-----   Message from Warden Fillers, MD sent at 04/09/2020 12:43 PM EDT ----- Slightly anemic at 9.9, start po feso4 BID with stool softener

## 2020-04-10 NOTE — Telephone Encounter (Signed)
Pt called back regarding triage message  Pt is not currently taking iron  Rx for iron sent to pt pharmacy.

## 2020-04-10 NOTE — Telephone Encounter (Signed)
TC to pt regarding results (Anemia) and to take iron if not already no answer left detailed message for pt to return call.

## 2020-04-11 LAB — CYTOLOGY - PAP
Comment: NEGATIVE
Diagnosis: UNDETERMINED — AB
High risk HPV: NEGATIVE

## 2020-04-27 ENCOUNTER — Encounter (HOSPITAL_COMMUNITY): Payer: Self-pay | Admitting: Obstetrics & Gynecology

## 2020-04-27 ENCOUNTER — Other Ambulatory Visit: Payer: Self-pay

## 2020-04-27 ENCOUNTER — Inpatient Hospital Stay (HOSPITAL_COMMUNITY)
Admission: AD | Admit: 2020-04-27 | Discharge: 2020-04-27 | Disposition: A | Discharge status: Home / Self Care | Payer: Medicaid Other | Attending: Obstetrics & Gynecology | Admitting: Obstetrics & Gynecology

## 2020-04-27 DIAGNOSIS — F429 Obsessive-compulsive disorder, unspecified: Secondary | ICD-10-CM | POA: Diagnosis not present

## 2020-04-27 DIAGNOSIS — Z79899 Other long term (current) drug therapy: Secondary | ICD-10-CM | POA: Diagnosis not present

## 2020-04-27 DIAGNOSIS — O98819 Other maternal infectious and parasitic diseases complicating pregnancy, unspecified trimester: Secondary | ICD-10-CM

## 2020-04-27 DIAGNOSIS — Z87891 Personal history of nicotine dependence: Secondary | ICD-10-CM | POA: Diagnosis not present

## 2020-04-27 DIAGNOSIS — O99011 Anemia complicating pregnancy, first trimester: Secondary | ICD-10-CM | POA: Diagnosis not present

## 2020-04-27 DIAGNOSIS — R519 Headache, unspecified: Secondary | ICD-10-CM | POA: Insufficient documentation

## 2020-04-27 DIAGNOSIS — O99341 Other mental disorders complicating pregnancy, first trimester: Secondary | ICD-10-CM | POA: Diagnosis not present

## 2020-04-27 DIAGNOSIS — Z3A10 10 weeks gestation of pregnancy: Secondary | ICD-10-CM | POA: Diagnosis not present

## 2020-04-27 DIAGNOSIS — D509 Iron deficiency anemia, unspecified: Secondary | ICD-10-CM

## 2020-04-27 DIAGNOSIS — O26891 Other specified pregnancy related conditions, first trimester: Secondary | ICD-10-CM

## 2020-04-27 DIAGNOSIS — Z3480 Encounter for supervision of other normal pregnancy, unspecified trimester: Secondary | ICD-10-CM

## 2020-04-27 DIAGNOSIS — R42 Dizziness and giddiness: Secondary | ICD-10-CM | POA: Diagnosis not present

## 2020-04-27 DIAGNOSIS — Z87440 Personal history of urinary (tract) infections: Secondary | ICD-10-CM | POA: Insufficient documentation

## 2020-04-27 LAB — CBC
HCT: 33 % — ABNORMAL LOW (ref 36.0–46.0)
Hemoglobin: 10 g/dL — ABNORMAL LOW (ref 12.0–15.0)
MCH: 25.2 pg — ABNORMAL LOW (ref 26.0–34.0)
MCHC: 30.3 g/dL (ref 30.0–36.0)
MCV: 83.1 fL (ref 80.0–100.0)
Platelets: 372 10*3/uL (ref 150–400)
RBC: 3.97 MIL/uL (ref 3.87–5.11)
RDW: 15.8 % — ABNORMAL HIGH (ref 11.5–15.5)
WBC: 15 10*3/uL — ABNORMAL HIGH (ref 4.0–10.5)
nRBC: 0 % (ref 0.0–0.2)

## 2020-04-27 LAB — URINALYSIS, ROUTINE W REFLEX MICROSCOPIC
Bilirubin Urine: NEGATIVE
Glucose, UA: NEGATIVE mg/dL
Hgb urine dipstick: NEGATIVE
Ketones, ur: NEGATIVE mg/dL
Nitrite: NEGATIVE
Protein, ur: 30 mg/dL — AB
Specific Gravity, Urine: 1.034 — ABNORMAL HIGH (ref 1.005–1.030)
pH: 5 (ref 5.0–8.0)

## 2020-04-27 LAB — COMPREHENSIVE METABOLIC PANEL
ALT: 15 U/L (ref 0–44)
AST: 16 U/L (ref 15–41)
Albumin: 3.4 g/dL — ABNORMAL LOW (ref 3.5–5.0)
Alkaline Phosphatase: 46 U/L (ref 38–126)
Anion gap: 10 (ref 5–15)
BUN: 9 mg/dL (ref 6–20)
CO2: 21 mmol/L — ABNORMAL LOW (ref 22–32)
Calcium: 9.1 mg/dL (ref 8.9–10.3)
Chloride: 103 mmol/L (ref 98–111)
Creatinine, Ser: 0.57 mg/dL (ref 0.44–1.00)
GFR calc Af Amer: >60 mL/min (ref >60)
GFR calc non Af Amer: >60 mL/min (ref >60)
Glucose, Bld: 81 mg/dL (ref 70–99)
Potassium: 3.5 mmol/L (ref 3.5–5.1)
Sodium: 134 mmol/L — ABNORMAL LOW (ref 135–145)
Total Bilirubin: 0.5 mg/dL (ref 0.3–1.2)
Total Protein: 7.7 g/dL (ref 6.5–8.1)

## 2020-04-27 MED ORDER — LACTATED RINGERS IV BOLUS
1000.0000 mL | Freq: Once | INTRAVENOUS | Status: AC
  Administered 2020-04-27: 1000 mL via INTRAVENOUS

## 2020-04-27 MED ORDER — METOCLOPRAMIDE HCL 5 MG/ML IJ SOLN
10.0000 mg | Freq: Once | INTRAMUSCULAR | Status: AC
  Administered 2020-04-27: 10 mg via INTRAVENOUS
  Filled 2020-04-27: qty 2

## 2020-04-27 MED ORDER — BUTALBITAL-APAP-CAFFEINE 50-325-40 MG PO TABS: 1.0000 | ORAL_TABLET | Freq: Four times a day (QID) | ORAL | 0 refills | Status: DC | PRN

## 2020-04-27 MED ORDER — DIPHENHYDRAMINE HCL 50 MG/ML IJ SOLN
25.0000 mg | Freq: Once | INTRAMUSCULAR | Status: AC
  Administered 2020-04-27: 25 mg via INTRAVENOUS
  Filled 2020-04-27: qty 1

## 2020-04-27 MED ORDER — METOCLOPRAMIDE HCL 10 MG PO TABS
10.0000 mg | ORAL_TABLET | Freq: Four times a day (QID) | ORAL | 2 refills | Status: DC | PRN
Start: 2020-04-27 — End: 2020-08-22

## 2020-04-27 MED ORDER — CYCLOBENZAPRINE HCL 10 MG PO TABS
10.0000 mg | ORAL_TABLET | Freq: Two times a day (BID) | ORAL | 0 refills | Status: DC | PRN
Start: 2020-04-27 — End: 2020-08-22

## 2020-04-27 MED ORDER — DEXAMETHASONE SODIUM PHOSPHATE 10 MG/ML IJ SOLN
10.0000 mg | Freq: Once | INTRAMUSCULAR | Status: AC
  Administered 2020-04-27: 10 mg via INTRAVENOUS
  Filled 2020-04-27: qty 1

## 2020-04-27 NOTE — MAU Note (Signed)
Shelby Duncan is a 29 y.o. at [redacted]w[redacted]d here in MAU reporting: dizzy spells started 2 days ago, states she passed out on her porch. Ever since there she has been very dizzy. Has been having episodes of nausea/vomiting but has not today. Pt reports she has not eaten. No abdominal pain, vaginal bleeding, or discharge.  Pt has small child with her in triage, pt informed of visitor policy and states she will call her sister to come get child.  Onset of complaint: 2 days  Pain score: 0/10  Vitals:   04/27/20 1830  BP: 123/65  Pulse: 84  Resp: 16  Temp: 97.8 F (36.6 C)  SpO2: 100%     Lab orders placed from triage: [facility]

## 2020-04-27 NOTE — Discharge Instructions (Signed)
For prevention of migraines in pregnancy: -Magnesium, 475m by mouth, once daily -Vitamin B2, 4038mby mouth, once daily  For treatment of migraines in pregnancy: -take medication at the first sign of the pain of a headache, or the first sign of your aura -start with 100022mylenol (do not exceed 4000m64m Tylenol in 24hrs), with or without Reglan 10mg52m no relief after 1-2hours, can take Flexeril 10mg 17mheadache is severe and not relieved by the above, may take Fioricet, 1 tablet, no more than 3 days per month -Fioricet should only be used as a rescue medication, when absolutely necessary -if the above regimen does not resolve your headache at all, please come to [facility]U for additional treatment -if you take Fioricet, please be aware that this has Tylenol in it and will contribute to the 4,000mg o59mlenol that you are allowed to take per day     Anemia  Anemia is a condition in which you do not have enough red blood cells or hemoglobin. Hemoglobin is a substance in red blood cells that carries oxygen. When you do not have enough red blood cells or hemoglobin (are anemic), your body cannot get enough oxygen and your organs may not work properly. As a result, you may feel very tired or have other problems. What are the causes? Common causes of anemia include:  Excessive bleeding. Anemia can be caused by excessive bleeding inside or outside the body, including bleeding from the intestine or from periods in women.  Poor nutrition.  Long-lasting (chronic) kidney, thyroid, and liver disease.  Bone marrow disorders.  Cancer and treatments for cancer.  HIV (human immunodeficiency virus) and AIDS (acquired immunodeficiency syndrome).  Treatments for HIV and AIDS.  Spleen problems.  Blood disorders.  Infections, medicines, and autoimmune disorders that destroy red blood cells. What are the signs or symptoms? Symptoms of this condition include:  Minor  weakness.  Dizziness.  Headache.  Feeling heartbeats that are irregular or faster than normal (palpitations).  Shortness of breath, especially with exercise.  Paleness.  Cold sensitivity.  Indigestion.  Nausea.  Difficulty sleeping.  Difficulty concentrating. Symptoms may occur suddenly or develop slowly. If your anemia is mild, you may not have symptoms. How is this diagnosed? This condition is diagnosed based on:  Blood tests.  Your medical history.  A physical exam.  Bone marrow biopsy. Your health care provider may also check your stool (feces) for blood and may do additional testing to look for the cause of your bleeding. You may also have other tests, including:  Imaging tests, such as a CT scan or MRI.  Endoscopy.  Colonoscopy. How is this treated? Treatment for this condition depends on the cause. If you continue to lose a lot of blood, you may need to be treated at a hospital. Treatment may include:  Taking supplements of iron, vitamin B12, orU44lic acid.  Taking a hormone medicine (erythropoietin) that can help to stimulate red blood cell growth.  Having a blood transfusion. This may be needed if you lose a lot of blood.  Making changes to your diet.  Having surgery to remove your spleen. Follow these instructions at home:  Take over-the-counter and prescription medicines only as told by your health care provider.  Take supplements only as told by your health care provider.  Follow any diet instructions that you were given.  Keep all follow-up visits as told by your health care provider. This is important. Contact a health care provider if:  You  develop new bleeding anywhere in the body. Get help right away if:  You are very weak.  You are short of breath.  You have pain in your abdomen or chest.  You are dizzy or feel faint.  You have trouble concentrating.  You have bloody or black, tarry stools.  You vomit repeatedly or you  vomit up blood. Summary  Anemia is a condition in which you do not have enough red blood cells or enough of a substance in your red blood cells that carries oxygen (hemoglobin).  Symptoms may occur suddenly or develop slowly.  If your anemia is mild, you may not have symptoms.  This condition is diagnosed with blood tests as well as a medical history and physical exam. Other tests may be needed.  Treatment for this condition depends on the cause of the anemia. This information is not intended to replace advice given to you by your health care provider. Make sure you discuss any questions you have with your health care provider. Document Revised: 08/26/2017 Document Reviewed: 10/15/2016 Elsevier Patient Education  [place].

## 2020-04-27 NOTE — MAU Provider Note (Addendum)
History     CSN: [id number]  Arrival date and time: 04/27/20 1818   First Provider Initiated Contact with Patient 04/27/20 1852      Chief Complaint  Patient presents with  . Dizziness   Shelby Duncan is a 29 y.o. G4P2 at [redacted]w[redacted]d who presents to MAU with complaints of dizziness and headache. Patient reports that dizziness started occurring 2 days ago. Patient reports that is mostly happens while she is at work, she will get lightheaded hot then dizzy. Patient reports feeling that 2 days ago when she actually passed out. She reports being unable to eat d/t nausea and eating "seems likes it makes my headaches worse". Patient reports having a hx of migraines throughout life. Is currently not on any medication for headaches or nausea. Rates HA 10/10. She denies having abdominal pain, vaginal bleeding, discharge, urinary symptoms or any other pregnancy complaints at this time.    OB History    Gravida  4   Para  2   Term  2   Preterm  0   AB  1   Living  2     SAB  1   TAB  0   Ectopic  0   Multiple  0   Live Births  2           Past Medical History:  Diagnosis Date  . Depression   . Infection    UTI  . OCD (obsessive compulsive disorder)   . PTSD (post-traumatic stress disorder)     Past Surgical History:  Procedure Laterality Date  . CHOLECYSTECTOMY N/A 09/17/2019   Procedure: LAPAROSCOPIC CHOLECYSTECTOMY WITH INTRAOPERATIVE CHOLANGIOGRAM;  Surgeon: Manus Rudd, MD;  Location: [facility] OR;  Service: General;  Laterality: N/A;  . NO PAST SURGERIES      Family History  Problem Relation Age of Onset  . Cancer Maternal Grandmother        breast  . Cancer Paternal Grandfather     Social History   Tobacco Use  . Smoking status: Former Smoker    Types: E-cigarettes    Quit date: 09/16/2019    Years since quitting: 0.6  . Smokeless tobacco: Never Used  . Tobacco comment: age 62  Vaping Use  . Vaping Use: Never used  Substance Use Topics  . Alcohol  use: No  . Drug use: Yes    Types: Marijuana    Comment: quite several weeks ago    Allergies:  Allergies  Allergen Reactions  . Chocolate Anaphylaxis and Rash  . 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    . Metrogel [Metronidazole] Swelling and Other (See Comments)    Reaction:  Swelling of vulva   . Shellfish Allergy Swelling and Rash    Medications Prior to Admission  Medication Sig Dispense Refill Last Dose  . acetaminophen (TYLENOL) 325 MG tablet Take 2 tablets (650 mg total) by mouth every 6 (six) hours as needed for mild pain. (Patient not taking: Reported on 03/18/2020)     . Doxylamine-Pyridoxine (DICLEGIS) 10-10 MG TBEC Take 2 tablets by mouth at bedtime. If symptoms persist, add one tablet in the morning and one in the afternoon 100 tablet 5   . ferrous sulfate 325 (65 FE) MG tablet Take 1 tablet (325 mg total) by mouth daily. 30 tablet 3   . ondansetron (ZOFRAN-ODT) 4 MG disintegrating tablet Take 1 tablet (4 mg total) by mouth every 6 (six)  hours as needed for nausea. (Patient not taking: Reported on 03/18/2020) 15 tablet 0     Review of Systems  Constitutional: Negative.   Respiratory: Negative.   Cardiovascular: Negative.   Gastrointestinal: Positive for nausea. Negative for constipation, diarrhea and vomiting.  Genitourinary: Negative.   Musculoskeletal: Negative.   Neurological: Positive for dizziness, syncope and headaches.       Syncope 2 days ago   Physical Exam    Patient Vitals for the past 24 hrs:  BP Temp Temp src Pulse Resp SpO2  04/27/20 1830 123/65 97.8 F (36.6 C) Oral 84 16 100 %   Physical Exam Vitals and nursing note reviewed.  HENT:     Head: Normocephalic.  Cardiovascular:     Rate and Rhythm: Normal rate and regular rhythm.  Pulmonary:     Effort: Pulmonary effort is normal. No respiratory distress.     Breath sounds: Normal breath sounds. No wheezing.  Abdominal:     General:  There is no distension.     Palpations: Abdomen is soft. There is no mass.     Tenderness: There is no abdominal tenderness. There is no guarding.  Skin:    General: Skin is warm and dry.  Neurological:     Mental Status: She is alert and oriented to person, place, and time.  Psychiatric:        Mood and Affect: Mood normal.        Behavior: Behavior normal.        Thought Content: Thought content normal.    FHR unable to be obtained by doppler- will perform bedside US to assess viability   MAU Course  Procedures  MDM Orders Placed This Encounter  Procedures  . Urinalysis, Routine w reflex microscopic  . CBC  . Comprehensive metabolic panel  . Orthostatic vital signs  . Insert peripheral IV   Meds ordered this encounter  Medications  . AND Linked Order Group   . diphenhydrAMINE (BENADRYL) injection 25 mg   . metoCLOPramide (REGLAN) injection 10 mg   . dexamethasone (DECADRON) injection 10 mg  . lactated ringers bolus 1,000 mL   Orthostatic vital WNL  Treatments in MAU including HA cocktail and LR bolus IV CBC and CMP pending  Care taken over by Thalia Bloodgood CNM @ [address], [place] 04/27/20, 7:56 PM  Results for orders placed or performed during the hospital encounter of 04/27/20 (from the past 24 hour(s))  Urinalysis, Routine w reflex microscopic     Status: Abnormal   Collection Time: 04/27/20  6:52 PM  Result Value Ref Range   Color, Urine AMBER (A) YELLOW   APPearance CLOUDY (A) CLEAR   Specific Gravity, Urine 1.034 (H) 1.005 - 1.030   pH 5.0 5.0 - 8.0   Glucose, UA NEGATIVE NEGATIVE mg/dL   Hgb urine dipstick NEGATIVE NEGATIVE   Bilirubin Urine NEGATIVE NEGATIVE   Ketones, ur NEGATIVE NEGATIVE mg/dL   Protein, ur 30 (A) NEGATIVE mg/dL   Nitrite NEGATIVE NEGATIVE   Leukocytes,Ua TRACE (A) NEGATIVE   RBC / HPF 0-5 0 - 5 RBC/hpf   WBC, UA 6-10 0 - 5 WBC/hpf   Bacteria, UA RARE (A) NONE SEEN   Squamous Epithelial / LPF 11-20 0 - 5   Mucus  PRESENT    Ca Oxalate Crys, UA PRESENT   CBC     Status: Abnormal   Collection Time: 04/27/20  7:18 PM  Result Value Ref Range   WBC 15.0 (H)  4.0 - 10.5 K/uL   RBC 3.97 3.87 - 5.11 MIL/uL   Hemoglobin 10.0 (L) 12.0 - 15.0 g/dL   HCT 63.8 (L) 36 - 46 %   MCV 83.1 80.0 - 100.0 fL   MCH 25.2 (L) 26.0 - 34.0 pg   MCHC 30.3 30.0 - 36.0 g/dL   RDW 75.6 (H) 43.3 - 29.5 %   Platelets 372 150 - 400 K/uL   nRBC 0.0 0.0 - 0.2 %  Comprehensive metabolic panel     Status: Abnormal   Collection Time: 04/27/20  7:18 PM  Result Value Ref Range   Sodium 134 (L) 135 - 145 mmol/L   Potassium 3.5 3.5 - 5.1 mmol/L   Chloride 103 98 - 111 mmol/L   CO2 21 (L) 22 - 32 mmol/L   Glucose, Bld 81 70 - 99 mg/dL   BUN 9 6 - 20 mg/dL   Creatinine, Ser 1.88 0.44 - 1.00 mg/dL   Calcium 9.1 8.9 - 41.6 mg/dL   Total Protein 7.7 6.5 - 8.1 g/dL   Albumin 3.4 (L) 3.5 - 5.0 g/dL   AST 16 15 - 41 U/L   ALT 15 0 - 44 U/L   Alkaline Phosphatase 46 38 - 126 U/L   Total Bilirubin 0.5 0.3 - 1.2 mg/dL   GFR calc non Af Amer >60 >60 mL/min   GFR calc Af Amer >60 >60 mL/min   Anion gap 10 5 - 15   Meds ordered this encounter  Medications  . AND Linked Order Group   . diphenhydrAMINE (BENADRYL) injection 25 mg   . metoCLOPramide (REGLAN) injection 10 mg   . dexamethasone (DECADRON) injection 10 mg  . lactated ringers bolus 1,000 mL  . metoCLOPramide (REGLAN) 10 MG tablet    Sig: Take 1 tablet (10 mg total) by mouth 4 (four) times daily as needed for nausea or vomiting.    Dispense:  30 tablet    Refill:  2    Order Specific Question:   Supervising Provider    Answer:   Jaynie Collins A [[id number]]  . cyclobenzaprine (FLEXERIL) 10 MG tablet    Sig: Take 1 tablet (10 mg total) by mouth 2 (two) times daily as needed for muscle spasms.    Dispense:  20 tablet    Refill:  0    Order Specific Question:   Supervising Provider    Answer:   Jaynie Collins A [[id number]]  . butalbital-acetaminophen-caffeine (FIORICET) 50-325-40  MG tablet    Sig: Take 1-2 tablets by mouth every 6 (six) hours as needed for headache.    Dispense:  20 tablet    Refill:  0    Order Specific Question:   Supervising Provider    Answer:   Jaynie Collins A [[id number]]   Assessment and Plan  --29 y.o. [id number] at [redacted]w[redacted]d  --+ cardiac flicker on BSUS --Patient sleeping after headache cocktail, new outpatient regimen for management in AVS --Mild anemia, restart Fe supplement as previously prescribed --Discharge home in stable condition  Clayton Bibles, MSN, CNM Certified Nurse Midwife, [employer] for [employer], [facility] 04/27/20 11:08 PM

## 2020-06-03 ENCOUNTER — Other Ambulatory Visit (HOSPITAL_COMMUNITY)
Admission: RE | Admit: 2020-06-03 | Discharge: 2020-06-03 | Disposition: A | Discharge status: Home / Self Care | Payer: Medicaid Other | Source: Ambulatory Visit | Attending: Obstetrics and Gynecology | Admitting: Obstetrics and Gynecology

## 2020-06-03 ENCOUNTER — Ambulatory Visit (INDEPENDENT_AMBULATORY_CARE_PROVIDER_SITE_OTHER): Payer: Medicaid Other | Admitting: Obstetrics and Gynecology

## 2020-06-03 ENCOUNTER — Other Ambulatory Visit: Payer: Self-pay

## 2020-06-03 ENCOUNTER — Encounter: Payer: Self-pay | Admitting: Obstetrics and Gynecology

## 2020-06-03 VITALS — BP 110/69 | HR 99 | Wt 208.6 lb

## 2020-06-03 DIAGNOSIS — Z3480 Encounter for supervision of other normal pregnancy, unspecified trimester: Secondary | ICD-10-CM | POA: Diagnosis not present

## 2020-06-03 DIAGNOSIS — F209 Schizophrenia, unspecified: Secondary | ICD-10-CM

## 2020-06-03 NOTE — Progress Notes (Signed)
Pt is here for ROB, [redacted]w[redacted]d. Needs anatomy scan [place] scheduled.

## 2020-06-03 NOTE — Progress Notes (Signed)
   PRENATAL VISIT NOTE  Subjective:  Shelby Duncan is a 29 y.o. [phone] at [redacted]w[redacted]d being seen today for ongoing prenatal care.  She is currently monitored for the following issues for this low-risk pregnancy and has Twin pregnancy w antenatal problem; Vanishing twin syndrome; Depression; Schizophrenia (HCC); Pregnancy in multigravida; Acute cholecystitis; Supervision of other normal pregnancy, antepartum; and Chlamydia infection, current pregnancy on their problem list.  Patient reports no complaints.  Contractions: Not present. Vag. Bleeding: None.  Movement: Present. Denies leaking of fluid.   The following portions of the patient's history were reviewed and updated as appropriate: allergies, current medications, past family history, past medical history, past social history, past surgical history and problem list.   Objective:   Vitals:   06/03/20 1508  BP: 110/69  Pulse: 99  Weight: 208 lb 9.6 oz (94.6 kg)    Fetal Status: Fetal Heart Rate (bpm): 162   Movement: Present     General:  Alert, oriented and cooperative. Patient is in no acute distress.  Skin: Skin is warm and dry. No rash noted.   Cardiovascular: Normal heart rate noted  Respiratory: Normal respiratory effort, no problems with respiration noted  Abdomen: Soft, gravid, appropriate for gestational age.  Pain/Pressure: Absent     Pelvic: closed, thick and high        Extremities: Normal range of motion.  Edema: None  Mental Status: Normal mood and affect. Normal behavior. Normal judgment and thought content.   Assessment and Plan:  Pregnancy: [id number] at [redacted]w[redacted]d 1. Supervision of other normal pregnancy, antepartum - Will fu on genital cultures. - Genetic screening discussed and ordered.  2. Schizophrenia, unspecified type (HCC) - stable.  Preterm labor symptoms and general obstetric precautions including but not limited to vaginal bleeding, contractions, leaking of fluid and fetal movement were reviewed in detail  with the patient. Please refer to After Visit Summary for other counseling recommendations.   Return in about 4 weeks (around 07/01/2020) for ROB virtual.  No future appointments.  Johnny Bridge, MD

## 2020-06-04 ENCOUNTER — Other Ambulatory Visit: Payer: Self-pay | Admitting: Obstetrics & Gynecology

## 2020-06-04 DIAGNOSIS — N760 Acute vaginitis: Secondary | ICD-10-CM

## 2020-06-04 LAB — CERVICOVAGINAL ANCILLARY ONLY
Bacterial Vaginitis (gardnerella): POSITIVE — AB
Candida Glabrata: NEGATIVE
Candida Vaginitis: NEGATIVE
Chlamydia: NEGATIVE
Comment: NEGATIVE
Comment: NEGATIVE
Comment: NEGATIVE
Comment: NEGATIVE
Comment: NEGATIVE
Comment: NORMAL
Neisseria Gonorrhea: NEGATIVE
Trichomonas: NEGATIVE

## 2020-06-04 MED ORDER — CLINDAMYCIN HCL 300 MG PO CAPS: 300.0000 mg | ORAL_CAPSULE | Freq: Two times a day (BID) | ORAL | 0 refills | Status: AC

## 2020-06-05 LAB — AFP, SERUM, OPEN SPINA BIFIDA
AFP MoM: 1.46
AFP Value: 46.5 ng/mL
Gest. Age on Collection Date: 16 weeks
Maternal Age At EDD: 28.3 yr
OSBR Risk 1 IN: 6095
Test Results:: NEGATIVE
Weight: 198 [lb_av]

## 2020-06-09 ENCOUNTER — Encounter: Payer: Self-pay | Admitting: Obstetrics and Gynecology

## 2020-06-12 ENCOUNTER — Telehealth: Payer: Self-pay

## 2020-06-12 NOTE — Telephone Encounter (Signed)
Received call from patient- She complains of dizziness, sob,headache and feeling like she is about to pass out. Her symptoms started a couple days ago and now are worse. I have advised patient to follow up with the ER to be check out and screen for covid. Patient verbalizes understanding.

## 2020-06-16 ENCOUNTER — Encounter: Payer: Self-pay | Admitting: Obstetrics and Gynecology

## 2020-06-26 ENCOUNTER — Ambulatory Visit: Payer: Medicaid Other

## 2020-07-01 ENCOUNTER — Other Ambulatory Visit: Payer: Self-pay | Admitting: Obstetrics and Gynecology

## 2020-07-01 ENCOUNTER — Ambulatory Visit: Payer: Medicaid Other | Attending: Obstetrics and Gynecology

## 2020-07-01 ENCOUNTER — Ambulatory Visit: Payer: Medicaid Other

## 2020-07-01 ENCOUNTER — Telehealth (INDEPENDENT_AMBULATORY_CARE_PROVIDER_SITE_OTHER): Payer: Medicaid Other | Admitting: Obstetrics and Gynecology

## 2020-07-01 ENCOUNTER — Encounter: Payer: Self-pay | Admitting: Obstetrics and Gynecology

## 2020-07-01 ENCOUNTER — Other Ambulatory Visit: Payer: Self-pay

## 2020-07-01 ENCOUNTER — Other Ambulatory Visit: Payer: Self-pay | Admitting: *Deleted

## 2020-07-01 DIAGNOSIS — A749 Chlamydial infection, unspecified: Secondary | ICD-10-CM

## 2020-07-01 DIAGNOSIS — Z3480 Encounter for supervision of other normal pregnancy, unspecified trimester: Secondary | ICD-10-CM

## 2020-07-01 DIAGNOSIS — O99342 Other mental disorders complicating pregnancy, second trimester: Secondary | ICD-10-CM

## 2020-07-01 DIAGNOSIS — Z3A20 20 weeks gestation of pregnancy: Secondary | ICD-10-CM

## 2020-07-01 DIAGNOSIS — F32A Depression, unspecified: Secondary | ICD-10-CM

## 2020-07-01 DIAGNOSIS — F209 Schizophrenia, unspecified: Secondary | ICD-10-CM

## 2020-07-01 DIAGNOSIS — O10919 Unspecified pre-existing hypertension complicating pregnancy, unspecified trimester: Secondary | ICD-10-CM

## 2020-07-01 DIAGNOSIS — O98812 Other maternal infectious and parasitic diseases complicating pregnancy, second trimester: Secondary | ICD-10-CM

## 2020-07-01 MED ORDER — BLOOD PRESSURE KIT DEVI: 1.0000 | 0 refills | Status: AC

## 2020-07-01 NOTE — Progress Notes (Signed)
   PRENATAL VISIT NOTE  Subjective:  Shelby Duncan is a 29 y.o. [phone] at 80w0dbeing seen today for ongoing prenatal care.  She is currently monitored for the following issues for this high-risk pregnancy and has Twin pregnancy w antenatal problem; Vanishing twin syndrome; Depression; Schizophrenia ([place]; Pregnancy in multigravida; Acute cholecystitis; Supervision of other normal pregnancy, antepartum; and Chlamydia infection, current pregnancy on their problem list.  This was a virtual visit on 07/01/2020.  The patient was in her care.  [facility]  Discussed with patient the limitation of a virtual visit.  She voiced understanding.    Patient reports backache.  Contractions: Not present. Vag. Bleeding: None.  Movement: Present. Denies leaking of fluid.   The following portions of the patient's history were reviewed and updated as appropriate: allergies, current medications, past family history, past medical history, past social history, past surgical history and problem list.   Objective:  There were no vitals filed for this visit.  Fetal Status:     Movement: Present     General:  Alert, oriented and cooperative. Patient is in no acute distress.  Skin: Skin is warm and dry. No rash noted.   Cardiovascular: Normal heart rate noted  Respiratory: Normal respiratory effort, no problems with respiration noted  Abdomen: Soft, gravid, appropriate for gestational age.  Pain/Pressure: Present     Pelvic: Cervical exam deferred        Extremities: Normal range of motion.  Edema: Trace  Mental Status: Normal mood and affect. Normal behavior. Normal judgment and thought content.   Assessment and Plan:  Pregnancy: [id number] [redacted]w[redacted]d. Supervision of other normal pregnancy, antepartum  - Blood Pressure Monitoring (BLOOD PRESSURE KIT) DEVI; 1 kit by Does not apply route once a week. Check Blood Pressure regularly and record readings into the [employer] App.  Large Cuff.   DX O90.0  Dispense: 1 each; Refill: 0   - Patient is for OB ultrasound this afternoon. 2. Chlamydia infection, current pregnancy - Test of cure negative on 06/03/2020.   - Will repeat cultures in third trimester.  3. Schizophrenia, unspecified type ([place]- stable  4. Depression during pregnancy in second trimester - stable  Preterm labor symptoms and general obstetric precautions including but not limited to vaginal bleeding, contractions, leaking of fluid and fetal movement were reviewed in detail with the patient. Please refer to After Visit Summary for other counseling recommendations.   Return in about 4 weeks (around 07/29/2020) for [place] virtual.  Future Appointments  Date Time Provider [place]10/01/2020 12:30 PM [facility] US[facility]  CyCephas DarbyMD

## 2020-07-01 NOTE — Progress Notes (Signed)
I connected with  Shelby Duncan on 07/01/20 by a video enabled telemedicine application and verified that I am speaking with the correct person using two identifiers.   Patient is at home and I am at [facility]  I discussed the limitations of evaluation and management by telemedicine. The patient expressed understanding and agreed to proceed.  MyChart OB c/o back, pelvic pain, pressure she wants [facility]  She will pick up BP Cuff today.

## 2020-07-09 ENCOUNTER — Telehealth: Payer: Self-pay

## 2020-07-09 NOTE — Telephone Encounter (Signed)
Pt called and reports that she had a headache earlier today and was feeling a little faint. I asked pt if she was taking her iron supplement we prescribed to her back in August for anemia, she reports she is not taking them. I advised pt that she needs to start taking the iron supplement. Pt also reports she has not picked up the BP cuff from the pharmacy. I advised pt to do this as well and she said she would do that today. I advised pt to check her BP once she has it and let us know if it is abnormal. Pt reports good fetal movement and denies any UC. I advised pt that if HA returns and she is experiencing blurry vision/dark spots that she should go to MAU for evaluation, pt voices understanding.

## 2020-07-29 ENCOUNTER — Ambulatory Visit: Payer: Medicaid Other | Attending: Obstetrics and Gynecology

## 2020-07-29 ENCOUNTER — Ambulatory Visit: Payer: Medicaid Other | Admitting: *Deleted

## 2020-07-29 ENCOUNTER — Telehealth: Payer: Medicaid Other | Admitting: Obstetrics and Gynecology

## 2020-07-29 ENCOUNTER — Other Ambulatory Visit: Payer: Self-pay

## 2020-07-29 DIAGNOSIS — O98819 Other maternal infectious and parasitic diseases complicating pregnancy, unspecified trimester: Secondary | ICD-10-CM

## 2020-07-29 DIAGNOSIS — Z3480 Encounter for supervision of other normal pregnancy, unspecified trimester: Secondary | ICD-10-CM | POA: Insufficient documentation

## 2020-07-29 DIAGNOSIS — Z362 Encounter for other antenatal screening follow-up: Secondary | ICD-10-CM | POA: Diagnosis not present

## 2020-07-29 DIAGNOSIS — O10919 Unspecified pre-existing hypertension complicating pregnancy, unspecified trimester: Secondary | ICD-10-CM | POA: Diagnosis present

## 2020-07-29 DIAGNOSIS — Z3A25 25 weeks gestation of pregnancy: Secondary | ICD-10-CM | POA: Diagnosis not present

## 2020-07-29 DIAGNOSIS — A749 Chlamydial infection, unspecified: Secondary | ICD-10-CM | POA: Insufficient documentation

## 2020-07-29 NOTE — Progress Notes (Signed)
Most recent BP 134/76  Reports she does well if avoids salt

## 2020-07-29 NOTE — Progress Notes (Signed)
Unable to connect with patient via teleconference.  She will be asked to reschedule

## 2020-08-22 ENCOUNTER — Inpatient Hospital Stay (HOSPITAL_COMMUNITY): Payer: Medicaid Other

## 2020-08-22 ENCOUNTER — Encounter (HOSPITAL_COMMUNITY): Payer: Self-pay | Admitting: Family Medicine

## 2020-08-22 ENCOUNTER — Other Ambulatory Visit: Payer: Self-pay

## 2020-08-22 ENCOUNTER — Inpatient Hospital Stay (HOSPITAL_COMMUNITY)
Admission: AD | Admit: 2020-08-22 | Discharge: 2020-08-22 | Disposition: A | Discharge status: Home / Self Care | Payer: Medicaid Other | Attending: Family Medicine | Admitting: Family Medicine

## 2020-08-22 DIAGNOSIS — O99283 Endocrine, nutritional and metabolic diseases complicating pregnancy, third trimester: Secondary | ICD-10-CM

## 2020-08-22 DIAGNOSIS — D509 Iron deficiency anemia, unspecified: Secondary | ICD-10-CM

## 2020-08-22 DIAGNOSIS — E876 Hypokalemia: Secondary | ICD-10-CM | POA: Diagnosis not present

## 2020-08-22 DIAGNOSIS — O26893 Other specified pregnancy related conditions, third trimester: Secondary | ICD-10-CM | POA: Insufficient documentation

## 2020-08-22 DIAGNOSIS — U071 COVID-19: Secondary | ICD-10-CM | POA: Diagnosis not present

## 2020-08-22 DIAGNOSIS — Z3A28 28 weeks gestation of pregnancy: Secondary | ICD-10-CM | POA: Insufficient documentation

## 2020-08-22 DIAGNOSIS — R071 Chest pain on breathing: Secondary | ICD-10-CM

## 2020-08-22 DIAGNOSIS — O99019 Anemia complicating pregnancy, unspecified trimester: Secondary | ICD-10-CM

## 2020-08-22 DIAGNOSIS — O99013 Anemia complicating pregnancy, third trimester: Secondary | ICD-10-CM | POA: Diagnosis not present

## 2020-08-22 DIAGNOSIS — O98513 Other viral diseases complicating pregnancy, third trimester: Secondary | ICD-10-CM | POA: Insufficient documentation

## 2020-08-22 DIAGNOSIS — Z3480 Encounter for supervision of other normal pregnancy, unspecified trimester: Secondary | ICD-10-CM

## 2020-08-22 LAB — CBC WITH DIFFERENTIAL/PLATELET
Abs Immature Granulocytes: 0.02 10*3/uL (ref 0.00–0.07)
Basophils Absolute: 0 10*3/uL (ref 0.0–0.1)
Basophils Relative: 0 %
Eosinophils Absolute: 0.1 10*3/uL (ref 0.0–0.5)
Eosinophils Relative: 1 %
HCT: 31.6 % — ABNORMAL LOW (ref 36.0–46.0)
Hemoglobin: 9.9 g/dL — ABNORMAL LOW (ref 12.0–15.0)
Immature Granulocytes: 0 %
Lymphocytes Relative: 20 %
Lymphs Abs: 1.5 10*3/uL (ref 0.7–4.0)
MCH: 26 pg (ref 26.0–34.0)
MCHC: 31.3 g/dL (ref 30.0–36.0)
MCV: 82.9 fL (ref 80.0–100.0)
Monocytes Absolute: 0.6 10*3/uL (ref 0.1–1.0)
Monocytes Relative: 8 %
Neutro Abs: 5.4 10*3/uL (ref 1.7–7.7)
Neutrophils Relative %: 71 %
Platelets: 287 10*3/uL (ref 150–400)
RBC: 3.81 MIL/uL — ABNORMAL LOW (ref 3.87–5.11)
RDW: 13.7 % (ref 11.5–15.5)
WBC: 7.6 10*3/uL (ref 4.0–10.5)
nRBC: 0 % (ref 0.0–0.2)

## 2020-08-22 LAB — COMPREHENSIVE METABOLIC PANEL
ALT: 16 U/L (ref 0–44)
AST: 18 U/L (ref 15–41)
Albumin: 2.6 g/dL — ABNORMAL LOW (ref 3.5–5.0)
Alkaline Phosphatase: 81 U/L (ref 38–126)
Anion gap: 12 (ref 5–15)
BUN: 5 mg/dL — ABNORMAL LOW (ref 6–20)
CO2: 22 mmol/L (ref 22–32)
Calcium: 8.5 mg/dL — ABNORMAL LOW (ref 8.9–10.3)
Chloride: 104 mmol/L (ref 98–111)
Creatinine, Ser: 0.43 mg/dL — ABNORMAL LOW (ref 0.44–1.00)
GFR, Estimated: >60 mL/min (ref >60)
Glucose, Bld: 80 mg/dL (ref 70–99)
Potassium: 3.2 mmol/L — ABNORMAL LOW (ref 3.5–5.1)
Sodium: 138 mmol/L (ref 135–145)
Total Bilirubin: 0.7 mg/dL (ref 0.3–1.2)
Total Protein: 6.4 g/dL — ABNORMAL LOW (ref 6.5–8.1)

## 2020-08-22 LAB — RESP PANEL BY RT-PCR (FLU A&B, COVID) ARPGX2
Influenza A by PCR: NEGATIVE
Influenza B by PCR: NEGATIVE
SARS Coronavirus 2 by RT PCR: POSITIVE — AB

## 2020-08-22 LAB — BRAIN NATRIURETIC PEPTIDE: B Natriuretic Peptide: 30.2 pg/mL (ref 0.0–100.0)

## 2020-08-22 LAB — FIBRINOGEN: Fibrinogen: 481 mg/dL — ABNORMAL HIGH (ref 210–475)

## 2020-08-22 LAB — TROPONIN I (HIGH SENSITIVITY)
Troponin I (High Sensitivity): 3 ng/L (ref <18)
Troponin I (High Sensitivity): 3 ng/L (ref <18)

## 2020-08-22 LAB — FERRITIN: Ferritin: 5 ng/mL — ABNORMAL LOW (ref 11–307)

## 2020-08-22 LAB — C-REACTIVE PROTEIN: CRP: 0.7 mg/dL (ref <1.0)

## 2020-08-22 MED ORDER — SODIUM CHLORIDE 0.9 % IV SOLN: INTRAVENOUS | Status: DC

## 2020-08-22 MED ORDER — SODIUM CHLORIDE 0.9 % IV SOLN
INTRAVENOUS | Status: DC | PRN
  Administered 2020-08-22: 1000 mL via INTRAVENOUS

## 2020-08-22 MED ORDER — FAMOTIDINE IN NACL 20-0.9 MG/50ML-% IV SOLN: 20.0000 mg | Freq: Once | INTRAVENOUS | Status: DC | PRN

## 2020-08-22 MED ORDER — FERROUS SULFATE 325 (65 FE) MG PO TABS: 325.0000 mg | ORAL_TABLET | ORAL | 2 refills | Status: DC

## 2020-08-22 MED ORDER — ALBUTEROL SULFATE HFA 108 (90 BASE) MCG/ACT IN AERS
2.0000 | INHALATION_SPRAY | Freq: Once | RESPIRATORY_TRACT | Status: DC | PRN
  Filled 2020-08-22: qty 6.7

## 2020-08-22 MED ORDER — DIPHENHYDRAMINE HCL 50 MG/ML IJ SOLN: 50.0000 mg | Freq: Once | INTRAMUSCULAR | Status: DC | PRN

## 2020-08-22 MED ORDER — PROMETHAZINE HCL 25 MG/ML IJ SOLN
25.0000 mg | Freq: Once | INTRAMUSCULAR | Status: AC
  Administered 2020-08-22: 25 mg via INTRAVENOUS
  Filled 2020-08-22: qty 1

## 2020-08-22 MED ORDER — EPINEPHRINE 0.3 MG/0.3ML IJ SOAJ
0.3000 mg | Freq: Once | INTRAMUSCULAR | Status: DC | PRN
  Filled 2020-08-22: qty 0.6

## 2020-08-22 MED ORDER — DEXAMETHASONE SODIUM PHOSPHATE 10 MG/ML IJ SOLN
10.0000 mg | Freq: Once | INTRAMUSCULAR | Status: AC
  Administered 2020-08-22: 10 mg via INTRAVENOUS
  Filled 2020-08-22: qty 1

## 2020-08-22 MED ORDER — DIPHENHYDRAMINE HCL 50 MG/ML IJ SOLN
25.0000 mg | Freq: Once | INTRAMUSCULAR | Status: AC
  Administered 2020-08-22: 25 mg via INTRAVENOUS
  Filled 2020-08-22: qty 1

## 2020-08-22 MED ORDER — SODIUM CHLORIDE 0.9 % IV SOLN
Freq: Once | INTRAVENOUS | Status: AC
  Filled 2020-08-22 (×3): qty 5

## 2020-08-22 MED ORDER — METHYLPREDNISOLONE SODIUM SUCC 125 MG IJ SOLR: 125.0000 mg | Freq: Once | INTRAMUSCULAR | Status: DC | PRN

## 2020-08-22 NOTE — MAU Provider Note (Signed)
Chief Complaint:  Emesis, Cough, and Generalized Body Aches   First Provider Initiated Contact with Patient 08/22/20 1829     HPI: Shelby Duncan is a 29 y.o. [id number] at 59w4dwho presents to maternity admissions reporting illness for the past 4 days. She has a headache, generalized body aches, chest pain and SOB with cough, nausea/vomiting, and loss of taste/smell. She had been visiting with her sister who was diagnosed with covid shortly after their visit. Has not been able to keep much down for the past couple of days. Denies vaginal bleeding, leaking of fluid, decreased fetal movement, fever or falls.  Past Medical History:  Diagnosis Date  . Depression   . Infection    UTI  . OCD (obsessive compulsive disorder)   . PTSD (post-traumatic stress disorder)    OB History  Gravida Para Term Preterm AB Living  _0 0 1 2  SAB TAB Ectopic Multiple Live Births  1 0 0 0 2    # Outcome Date GA Lbr Len/2nd Weight Sex Delivery Anes PTL Lv  4 Current           3 Term 06/30/16 34w3d0:13 / 00:07 6 lb 14.9 oz (3.145 kg) F Vag-Spont None  LIV  2 SAB 07/31/15          1 Term 05/15/09 3893w0d lb 2.3 oz (3.24 kg) M Vag-Spont None N LIV   Past Surgical History:  Procedure Laterality Date  . CHOLECYSTECTOMY N/A 09/17/2019   Procedure: LAPAROSCOPIC CHOLECYSTECTOMY WITH INTRAOPERATIVE CHOLANGIOGRAM;  Surgeon: TsuDonnie MesaD;  [facility];  Laterality: N/A;  . NO PAST SURGERIES     Family History  Problem Relation Age of Onset  . Cancer Maternal Grandmother        breast  . Cancer Paternal Grandfather    Social History   Tobacco Use  . Smoking status: Former Smoker    Types: E-cigarettes    Quit date: 09/16/2019    Years since quitting: 0.9  . Smokeless tobacco: Never Used  . Tobacco comment: age 51 33aping Use  . Vaping Use: Never used  Substance Use Topics  . Alcohol use: No  . Drug use: Yes    Types: Marijuana    Comment: quite several weeks ago    Allergies  Allergen Reactions  . Chocolate Anaphylaxis and Rash  . 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    . Metrogel [Metronidazole] Swelling and Other (See Comments)    Reaction:  Swelling of vulva   . Shellfish Allergy Swelling and Rash   Medications Prior to Admission  Medication Sig Dispense Refill Last Dose  . Prenatal Vit-Fe Fumarate-FA (PRENATAL MULTIVITAMIN) TABS tablet Take 1 tablet by mouth daily at 12 noon.   08/22/2020 at Unknown time  . acetaminophen (TYLENOL) 325 MG tablet Take 2 tablets (650 mg total) by mouth every 6 (six) hours as needed for mild pain. (Patient not taking: Reported on 03/18/2020)     . Blood Pressure Monitoring (BLOOD PRESSURE KIT) DEVI 1 kit by Does not apply route once a week. Check Blood Pressure regularly and record readings into the Babys[employer] App.  Large Cuff.  DX O90.0 1 each 0   . butalbital-acetaminophen-caffeine (FIORICET) 50-325-40 MG tablet Take 1-2 tablets by mouth every 6 (six) hours as needed for headache. (Patient not taking: Reported on 06/03/2020) 20 tablet 0   . cyclobenzaprine (  FLEXERIL) 10 MG tablet Take 1 tablet (10 mg total) by mouth 2 (two) times daily as needed for muscle spasms. (Patient not taking: Reported on 06/03/2020) 20 tablet 0   . Doxylamine-Pyridoxine (DICLEGIS) 10-10 MG TBEC Take 2 tablets by mouth at bedtime. If symptoms persist, add one tablet in the morning and one in the afternoon (Patient not taking: Reported on 06/03/2020) 100 tablet 5   . metoCLOPramide (REGLAN) 10 MG tablet Take 1 tablet (10 mg total) by mouth 4 (four) times daily as needed for nausea or vomiting. (Patient not taking: Reported on 06/03/2020) 30 tablet 2   . ondansetron (ZOFRAN-ODT) 4 MG disintegrating tablet Take 1 tablet (4 mg total) by mouth every 6 (six) hours as needed for nausea. (Patient not taking: Reported on 03/18/2020) 15 tablet 0   . [DISCONTINUED] ferrous sulfate 325 (65 FE) MG tablet Take  1 tablet (325 mg total) by mouth daily. 30 tablet 3     I have reviewed patient's Past Medical Hx, Surgical Hx, Family Hx, Social Hx, medications and allergies.   ROS:  Review of Systems  Constitutional: Positive for chills and fatigue. Negative for fever.  HENT: Positive for congestion.   Eyes: Negative for visual disturbance.  Respiratory: Positive for cough, chest tightness and shortness of breath. Negative for wheezing and stridor.   Cardiovascular: Positive for chest pain (with coughing).  Gastrointestinal: Positive for nausea and vomiting. Negative for abdominal pain.  Genitourinary: Negative for flank pain, pelvic pain, vaginal bleeding, vaginal discharge and vaginal pain.  Neurological: Negative for syncope, light-headedness and headaches.  All other systems reviewed and are negative.   Physical Exam   Patient Vitals for the past 24 hrs:  BP Temp Temp src Pulse Resp SpO2 Height Weight  08/22/20 2046 119/71 98.1 F (36.7 C) Oral 89 16 -- -- --  08/22/20 2005 125/64 98.7 F (37.1 C) Oral 90 15 100 % -- --  08/22/20 1800 -- -- -- -- -- 100 % -- --  08/22/20 1750 -- -- -- -- -- 100 % -- --  08/22/20 1740 -- -- -- -- -- 99 % -- --  08/22/20 1725 -- -- -- -- -- 99 % -- --  08/22/20 1720 -- -- -- -- -- 99 % -- --  08/22/20 1703 123/66 97.7 F (36.5 C) -- 95 20 100 % -- --  08/22/20 1654 -- -- -- -- -- -- _0  (1.753 m) 196 lb (88.9 kg)    Constitutional: Well-developed, well-nourished female in no acute distress.  Cardiovascular: normal rate & rhythm, no murmur Respiratory: normal effort, lung sounds clear throughout GI: Abd soft, non-tender, gravid appropriate for gestational age. Pos BS x 4 MS: Extremities nontender, no edema, normal ROM Neurologic: Alert and oriented x 4.  GU: no CVA tenderness Pelvic exam deferred  Fetal Tracing: reactive Baseline: 160 Variability: minimal-moderate, appropriate for gestational age 44: 10x10s Decelerations: none Toco:  relaxed   Labs: Results for orders placed or performed during the hospital encounter of 08/22/20 (from the past 24 hour(s))  Resp Panel by RT-PCR (Flu A&B, Covid) Nasopharyngeal Swab     Status: Abnormal   Collection Time: 08/22/20  5:04 PM   Specimen: Nasopharyngeal Swab; Nasopharyngeal(NP) swabs in vial transport medium  Result Value Ref Range   SARS Coronavirus 2 by RT PCR POSITIVE (A) NEGATIVE   Influenza A by PCR NEGATIVE NEGATIVE   Influenza B by PCR NEGATIVE NEGATIVE  CBC with Differential/Platelet     Status: Abnormal   Collection  Time: 08/22/20  5:42 PM  Result Value Ref Range   WBC 7.6 4.0 - 10.5 K/uL   RBC 3.81 (L) 3.87 - 5.11 MIL/uL   Hemoglobin 9.9 (L) 12.0 - 15.0 g/dL   HCT 31.6 (L) 36 - 46 %   MCV 82.9 80.0 - 100.0 fL   MCH 26.0 26.0 - 34.0 pg   MCHC 31.3 30.0 - 36.0 g/dL   RDW 13.7 11.5 - 15.5 %   Platelets 287 150 - 400 K/uL   nRBC 0.0 0.0 - 0.2 %   Neutrophils Relative % 71 %   Neutro Abs 5.4 1.7 - 7.7 K/uL   Lymphocytes Relative 20 %   Lymphs Abs 1.5 0.7 - 4.0 K/uL   Monocytes Relative 8 %   Monocytes Absolute 0.6 0.1 - 1.0 K/uL   Eosinophils Relative 1 %   Eosinophils Absolute 0.1 0.0 - 0.5 K/uL   Basophils Relative 0 %   Basophils Absolute 0.0 0.0 - 0.1 K/uL   Immature Granulocytes 0 %   Abs Immature Granulocytes 0.02 0.00 - 0.07 K/uL  Brain natriuretic peptide     Status: None   Collection Time: 08/22/20  5:42 PM  Result Value Ref Range   B Natriuretic Peptide 30.2 0.0 - 100.0 pg/mL  Comprehensive metabolic panel     Status: Abnormal   Collection Time: 08/22/20  5:42 PM  Result Value Ref Range   Sodium 138 135 - 145 mmol/L   Potassium 3.2 (L) 3.5 - 5.1 mmol/L   Chloride 104 98 - 111 mmol/L   CO2 22 22 - 32 mmol/L   Glucose, Bld 80 70 - 99 mg/dL   BUN 5 (L) 6 - 20 mg/dL   Creatinine, Ser 0.43 (L) 0.44 - 1.00 mg/dL   Calcium 8.5 (L) 8.9 - 10.3 mg/dL   Total Protein 6.4 (L) 6.5 - 8.1 g/dL   Albumin 2.6 (L) 3.5 - 5.0 g/dL   AST 18 15 - 41 U/L    ALT 16 0 - 44 U/L   Alkaline Phosphatase 81 38 - 126 U/L   Total Bilirubin 0.7 0.3 - 1.2 mg/dL   GFR, Estimated >60 >60 mL/min   Anion gap 12 5 - 15  Troponin I (High Sensitivity)     Status: None   Collection Time: 08/22/20  5:42 PM  Result Value Ref Range   Troponin I (High Sensitivity) 3 <18 ng/L  C-reactive protein     Status: None   Collection Time: 08/22/20  5:42 PM  Result Value Ref Range   CRP 0.7 <1.0 mg/dL  Fibrinogen     Status: Abnormal   Collection Time: 08/22/20  5:42 PM  Result Value Ref Range   Fibrinogen 481 (H) 210 - 475 mg/dL  Ferritin     Status: Abnormal   Collection Time: 08/22/20  5:42 PM  Result Value Ref Range   Ferritin 5 (L) 11 - 307 ng/mL  Troponin I (High Sensitivity)     Status: None   Collection Time: 08/22/20  7:25 PM  Result Value Ref Range   Troponin I (High Sensitivity) 3 <18 ng/L    Imaging:  DG CHEST PORT 1 VIEW  Result Date: 08/22/2020 CLINICAL DATA:  29 year old female with positive COVID-19 and cough EXAM: PORTABLE CHEST 1 VIEW COMPARISON:  Chest radiograph dated 08/03/2019. FINDINGS: The heart size and mediastinal contours are within normal limits. Both lungs are clear. The visualized skeletal structures are unremarkable. IMPRESSION: No active disease. Electronically Signed   By: Milas Hock  Radparvar M.D.   On: 08/22/2020 18:31    MAU Course: Orders Placed This Encounter  Procedures  . Resp Panel by RT-PCR (Flu A&B, Covid) Nasopharyngeal Swab  . DG CHEST PORT 1 VIEW  . CBC with Differential/Platelet  . Brain natriuretic peptide  . Comprehensive metabolic panel  . C-reactive protein  . Fibrinogen  . Ferritin  . Hypersensitivity GRADE 1: Transient flushing or rash, or drug fever < 100.4 F  . Hypersensitivity GRADE 2: Rash, flushing, urticaria, dyspnea, or drug fever = or > 100.4 F  . Hypersensitivity GRADE 3: symptomatic bronchospasm, with or without urticaria, parenteral medication management indicated, allergy-related  edema/angioedema, or hypotension  . Hypersensitivity GRADE 4: Anaphylaxis  . Fetal monitoring  . Assess fetal heart tones  . Provide patient the appropriate monoclonal antibody fact sheet prior to administration  . casirivimab / imdevimab per pharmacy consult  . Airborne precautions  . ED EKG  . Insert peripheral IV  . Discharge patient   Meds ordered this encounter  Medications  . AND Linked Order Group   . 0.9 %  sodium chloride infusion   . diphenhydrAMINE (BENADRYL) injection 25 mg   . dexamethasone (DECADRON) injection 10 mg  . promethazine (PHENERGAN) injection 25 mg  . casirivimab (REGN 10933) 600 mg, imdevimab (REGN 10987) 600 mg in sodium chloride 0.9 % 110 mL IVPB    Order Specific Question:   I attest that this patient meets the FDA Emergency Use Authorization criteria and has risk factor(s) for progression to severe COVID-19 and/or hospitalization:    Answer:   Yes    Order Specific Question:   High Risk Factor(s) for COVID-19 Progression:    Answer:   Pregnancy  . 0.9 %  sodium chloride infusion  . diphenhydrAMINE (BENADRYL) injection 50 mg  . famotidine (PEPCID) IVPB 20 mg premix  . methylPREDNISolone sodium succinate (SOLU-MEDROL) 125 mg/2 mL injection 125 mg  . albuterol (VENTOLIN HFA) 108 (90 Base) MCG/ACT inhaler 2 puff  . EPINEPHrine (EPI-PEN) injection 0.3 mg  . ferrous sulfate 325 (65 FE) MG tablet    Sig: Take 1 tablet (325 mg total) by mouth every other day. Take with breakfast    Dispense:  30 tablet    Refill:  2    Order Specific Question:   Supervising Provider    Answer:   Merrily Pew    MDM: Resp swab + for Covid Covid labs normal (or WNL for pregnancy) Iron deficiency anemia noted on CBC/ferritin, will discharge on oral iron Hypokalemia noted, too nauseated for K-Dur, will educate on oral sources of potassium Chest x-ray clear Normal EKG  Offered pt monoclonal antibody treatment, pt amenable to plan and tolerated treatment  well.  Assessment: 1. Covid-19 affecting pregnancy 2. Hypokalemia 3. Iron deficiency anemia  Plan: Discharge home in stable condition with return precautions.  See AVS for additional discharge instructions on Covid-19, anemia and hypokalemia  Follow-up [facility] Follow up.   Specialty: Obstetrics and Gynecology Why: as scheduled for ongoing prenatal care Contact information: [address], [place] [phone]              Allergies as of 08/22/2020      Reactions   Chocolate Anaphylaxis, Rash   Food Anaphylaxis, Rash, Other (See Comments)   Pt states that she is allergic to oranges.     Metrogel [metronidazole] Swelling, Other (See Comments)   Reaction:  Swelling of vulva    Shellfish Allergy Swelling, Rash      Medication List    STOP taking these medications   acetaminophen 325 MG tablet Commonly known as: TYLENOL   butalbital-acetaminophen-caffeine 50-325-40 MG tablet Commonly known as: FIORICET   cyclobenzaprine 10 MG tablet Commonly known as: FLEXERIL   Doxylamine-Pyridoxine 10-10 MG Tbec Commonly known as: Diclegis   metoCLOPramide 10 MG tablet Commonly known as: REGLAN   ondansetron 4 MG disintegrating tablet Commonly known as: ZOFRAN-ODT     TAKE these medications   Blood Pressure Kit Devi 1 kit by Does not apply route once a week. Check Blood Pressure regularly and record readings into the [employer] App.  Large Cuff.  DX O90.0   ferrous sulfate 325 (65 FE) MG tablet Take 1 tablet (325 mg total) by mouth every other day. Take with breakfast What changed:   when to take this  additional instructions   prenatal multivitamin Tabs tablet Take 1 tablet by mouth daily at 12 noon.       Gaylan Gerold, CNM, MSN, [facility] 08/22/20 9:20 PM

## 2020-08-22 NOTE — Discharge Instructions (Signed)
COVID-19 COVID-19 is a respiratory infection that is caused by a virus called severe acute respiratory syndrome coronavirus 2 (SARS-CoV-2). The disease is also known as coronavirus disease or novel coronavirus. In some people, the virus may not cause any symptoms. In others, it may cause a serious infection. The infection can get worse quickly and can lead to complications, such as:  Pneumonia, or infection of the lungs.  Acute respiratory distress syndrome or ARDS. This is a condition in which fluid build-up in the lungs prevents the lungs from filling with air and passing oxygen into the blood.  Acute respiratory failure. This is a condition in which there is not enough oxygen passing from the lungs to the body or when carbon dioxide is not passing from the lungs out of the body.  Sepsis or septic shock. This is a serious bodily reaction to an infection.  Blood clotting problems.  Secondary infections due to bacteria or fungus.  Organ failure. This is when your body's organs stop working. The virus that causes COVID-19 is contagious. This means that it can spread from person to person through droplets from coughs and sneezes (respiratory secretions). What are the causes? This illness is caused by a virus. You may catch the virus by:  Breathing in droplets from an infected person. Droplets can be spread by a person breathing, speaking, singing, coughing, or sneezing.  Touching something, like a table or a doorknob, that was exposed to the virus (contaminated) and then touching your mouth, nose, or eyes. What increases the risk? Risk for infection You are more likely to be infected with this virus if you:  Are within 6 feet (2 meters) of a person with COVID-19.  Provide care for or live with a person who is infected with COVID-19.  Spend time in crowded indoor spaces or live in shared housing. Risk for serious illness You are more likely to become seriously ill from the virus if  you:  Are 50 years of age or older. The higher your age, the more you are at risk for serious illness.  Live in a nursing home or long-term care facility.  Have cancer.  Have a long-term (chronic) disease such as: ? Chronic lung disease, including chronic obstructive pulmonary disease or asthma. ? A long-term disease that lowers your body's ability to fight infection (immunocompromised). ? Heart disease, including heart failure, a condition in which the arteries that lead to the heart become narrow or blocked (coronary artery disease), a disease which makes the heart muscle thick, weak, or stiff (cardiomyopathy). ? Diabetes. ? Chronic kidney disease. ? Sickle cell disease, a condition in which red blood cells have an abnormal "sickle" shape. ? Liver disease.  Are obese. What are the signs or symptoms? Symptoms of this condition can range from mild to severe. Symptoms may appear any time from 2 to 14 days after being exposed to the virus. They include:  A fever or chills.  A cough.  Difficulty breathing.  Headaches, body aches, or muscle aches.  Runny or stuffy (congested) nose.  A sore throat.  New loss of taste or smell. Some people may also have stomach problems, such as nausea, vomiting, or diarrhea. Other people may not have any symptoms of COVID-19. How is this diagnosed? This condition may be diagnosed based on:  Your signs and symptoms, especially if: ? You live in an area with a COVID-19 outbreak. ? You recently traveled to or from an area where the virus is common. ? You   provide care for or live with a person who was diagnosed with COVID-19. ? You were exposed to a person who was diagnosed with COVID-19.  A physical exam.  Lab tests, which may include: ? Taking a sample of fluid from the back of your nose and throat (nasopharyngeal fluid), your nose, or your throat using a swab. ? A sample of mucus from your lungs (sputum). ? Blood tests.  Imaging tests,  which may include, X-rays, CT scan, or ultrasound. How is this treated? At present, there is no medicine to treat COVID-19. Medicines that treat other diseases are being used on a trial basis to see if they are effective against COVID-19. Your health care provider will talk with you about ways to treat your symptoms. For most people, the infection is mild and can be managed at home with rest, fluids, and over-the-counter medicines. Treatment for a serious infection usually takes places in a hospital intensive care unit (ICU). It may include one or more of the following treatments. These treatments are given until your symptoms improve.  Receiving fluids and medicines through an IV.  Supplemental oxygen. Extra oxygen is given through a tube in the nose, a face mask, or a hood.  Positioning you to lie on your stomach (prone position). This makes it easier for oxygen to get into the lungs.  Continuous positive airway pressure (CPAP) or bi-level positive airway pressure (BPAP) machine. This treatment uses mild air pressure to keep the airways open. A tube that is connected to a motor delivers oxygen to the body.  Ventilator. This treatment moves air into and out of the lungs by using a tube that is placed in your windpipe.  Tracheostomy. This is a procedure to create a hole in the neck so that a breathing tube can be inserted.  Extracorporeal membrane oxygenation (ECMO). This procedure gives the lungs a chance to recover by taking over the functions of the heart and lungs. It supplies oxygen to the body and removes carbon dioxide. Follow these instructions at home: Lifestyle  If you are sick, stay home except to get medical care. Your health care provider will tell you how long to stay home. Call your health care provider before you go for medical care.  Rest at home as told by your health care provider.  Do not use any products that contain nicotine or tobacco, such as cigarettes,  e-cigarettes, and chewing tobacco. If you need help quitting, ask your health care provider.  Return to your normal activities as told by your health care provider. Ask your health care provider what activities are safe for you. General instructions  Take over-the-counter and prescription medicines only as told by your health care provider.  Drink enough fluid to keep your urine pale yellow.  Keep all follow-up visits as told by your health care provider. This is important. How is this prevented?  There is no vaccine to help prevent COVID-19 infection. However, there are steps you can take to protect yourself and others from this virus. To protect yourself:   Do not travel to areas where COVID-19 is a risk. The areas where COVID-19 is reported change often. To identify high-risk areas and travel restrictions, check the CDC travel website: wwwnc.cdc.gov/travel/notices  If you live in, or must travel to, an area where COVID-19 is a risk, take precautions to avoid infection. ? Stay away from people who are sick. ? Wash your hands often with soap and water for 20 seconds. If soap and water   are not available, use an alcohol-based hand sanitizer. ? Avoid touching your mouth, face, eyes, or nose. ? Avoid going out in public, follow guidance from your state and local health authorities. ? If you must go out in public, wear a cloth face covering or face mask. Make sure your mask covers your nose and mouth. ? Avoid crowded indoor spaces. Stay at least 6 feet (2 meters) away from others. ? Disinfect objects and surfaces that are frequently touched every day. This may include:  Counters and tables.  Doorknobs and light switches.  Sinks and faucets.  Electronics, such as phones, remote controls, keyboards, computers, and tablets. To protect others: If you have symptoms of COVID-19, take steps to prevent the virus from spreading to others.  If you think you have a COVID-19 infection, contact  your health care provider right away. Tell your health care team that you think you may have a COVID-19 infection.  Stay home. Leave your house only to seek medical care. Do not use public transport.  Do not travel while you are sick.  Wash your hands often with soap and water for 20 seconds. If soap and water are not available, use alcohol-based hand sanitizer.  Stay away from other members of your household. Let healthy household members care for children and pets, if possible. If you have to care for children or pets, wash your hands often and wear a mask. If possible, stay in your own room, separate from others. Use a different bathroom.  Make sure that all people in your household wash their hands well and often.  Cough or sneeze into a tissue or your sleeve or elbow. Do not cough or sneeze into your hand or into the air.  Wear a cloth face covering or face mask. Make sure your mask covers your nose and mouth. Where to find more information  Centers for Disease Control and Prevention: www.cdc.gov/coronavirus/2019-ncov/index.html  World Health Organization: www.who.int/health-topics/coronavirus Contact a health care provider if:  You live in or have traveled to an area where COVID-19 is a risk and you have symptoms of the infection.  You have had contact with someone who has COVID-19 and you have symptoms of the infection. Get help right away if:  You have trouble breathing.  You have pain or pressure in your chest.  You have confusion.  You have bluish lips and fingernails.  You have difficulty waking from sleep.  You have symptoms that get worse. These symptoms may represent a serious problem that is an emergency. Do not wait to see if the symptoms will go away. Get medical help right away. Call your local emergency services (911 in the [place] Do not drive yourself to the hospital. Let the emergency medical personnel know if you think you have  COVID-19. Summary  COVID-19 is a respiratory infection that is caused by a virus. It is also known as coronavirus disease or novel coronavirus. It can cause serious infections, such as pneumonia, acute respiratory distress syndrome, acute respiratory failure, or sepsis.  The virus that causes COVID-19 is contagious. This means that it can spread from person to person through droplets from breathing, speaking, singing, coughing, or sneezing.  You are more likely to develop a serious illness if you are 50 years of age or older, have a weak immune system, live in a nursing home, or have chronic disease.  There is no medicine to treat COVID-19. Your health care provider will talk with you about ways to treat your symptoms.    Take steps to protect yourself and others from infection. Wash your hands often and disinfect objects and surfaces that are frequently touched every day. Stay away from people who are sick and wear a mask if you are sick. This information is not intended to replace advice given to you by your health care provider. Make sure you discuss any questions you have with your health care provider. Document Revised: 07/13/2019 Document Reviewed: 10/19/2018 Elsevier Patient Education  2020 [employer]  10 Things You Can Do to Manage Your COVID-19 Symptoms at Home If you have possible or confirmed COVID-19: 1. Stay home from work and school. And stay away from other public places. If you must go out, avoid using any kind of public transportation, ridesharing, or taxis. 2. Monitor your symptoms carefully. If your symptoms get worse, call your healthcare provider immediately. 3. Get rest and stay hydrated. 4. If you have a medical appointment, call the healthcare provider ahead of time and tell them that you have or may have COVID-19. 5. For medical emergencies, call 911 and notify the dispatch personnel that you have or may have COVID-19. 6. Cover your cough and sneezes with a tissue or  use the inside of your elbow. 7. Wash your hands often with soap and water for at least 20 seconds or clean your hands with an alcohol-based hand sanitizer that contains at least 60% alcohol. 8. As much as possible, stay in a specific room and away from other people in your home. Also, you should use a separate bathroom, if available. If you need to be around other people in or outside of the home, wear a mask. 9. Avoid sharing personal items with other people in your household, like dishes, towels, and bedding. 10. Clean all surfaces that are touched often, like counters, tabletops, and doorknobs. Use household cleaning sprays or wipes according to the label instructions. [employer] 03/28/2019 This information is not intended to replace advice given to you by your health care provider. Make sure you discuss any questions you have with your health care provider. Document Revised: 08/30/2019 Document Reviewed: 08/30/2019 Elsevier Patient Education  2020 [employer].  Iron Deficiency Anemia, Adult Iron-deficiency anemia is when you have a low amount of red blood cells or hemoglobin. This happens because you have too little iron in your body. Hemoglobin carries oxygen to parts of the body. Anemia can cause your body to not get enough oxygen. It may or may not cause symptoms. Follow these instructions at home: Medicines  Take over-the-counter and prescription medicines only as told by your doctor. This includes iron pills (supplements) and vitamins.  If you cannot handle taking iron pills by mouth, ask your doctor about getting iron through: ? A vein (intravenously). ? A shot (injection) into a muscle.  Take iron pills when your stomach is empty. If you cannot handle this, take them with food.  Do not drink milk or take antacids at the same time as your iron pills.  To prevent trouble pooping (constipation), eat fiber or take medicine (stool softener) as told by your doctor. Eating  and drinking   Talk with your doctor before changing the foods you eat. He or she may tell you to eat foods that have a lot of iron, such as: ? Liver. ? Lowfat (lean) beef. ? Breads and cereals that have iron added to them (fortified breads and cereals). ? Eggs. ? Dried fruit. ? Dark green, leafy vegetables.  Drink enough fluid to keep your pee (urine)  clear or pale yellow.  Eat fresh fruits and vegetables that are high in vitamin C. They help your body to use iron. Foods with a lot of vitamin C include: ? Oranges. ? Peppers. ? Tomatoes. ? Mangoes. General instructions  Return to your normal activities as told by your doctor. Ask your doctor what activities are safe for you.  Keep yourself clean, and keep things clean around you (your surroundings). Anemia can make you get sick more easily.  Keep all follow-up visits as told by your doctor. This is important. Contact a doctor if:  You feel sick to your stomach (nauseous).  You throw up (vomit).  You feel weak.  You are sweating for no clear reason.  You have trouble pooping, such as: ? Pooping (having a bowel movement) less than 3 times a week. ? Straining to poop. ? Having poop that is hard, dry, or larger than normal. ? Feeling full or bloated. ? Pain in the lower belly. ? Not feeling better after pooping. Get help right away if:  You pass out (faint). If this happens, do not drive yourself to the hospital. Call your local emergency services (911 in the [place]  You have chest pain.  You have shortness of breath that: ? Is very bad. ? Gets worse with physical activity.  You have a fast heartbeat.  You get light-headed when getting up from sitting or lying down. This information is not intended to replace advice given to you by your health care provider. Make sure you discuss any questions you have with your health care provider. Document Revised: 08/26/2017 Document Reviewed: 06/02/2016 Elsevier Patient  Education  2020 [employer].  Hypokalemia Hypokalemia means that the amount of potassium in the blood is lower than normal. Potassium is a chemical (electrolyte) that helps regulate the amount of fluid in the body. It also stimulates muscle tightening (contraction) and helps nerves work properly. Normally, most of the body's potassium is inside cells, and only a very small amount is in the blood. Because the amount in the blood is so small, minor changes to potassium levels in the blood can be life-threatening. What are the causes? This condition may be caused by:  Antibiotic medicine.  Diarrhea or vomiting. Taking too much of a medicine that helps you have a bowel movement (laxative) can cause diarrhea and lead to hypokalemia.  Chronic kidney disease (CKD).  Medicines that help the body get rid of excess fluid (diuretics).  Eating disorders, such as bulimia.  Low magnesium levels in the body.  Sweating a lot. What are the signs or symptoms? Symptoms of this condition include:  Weakness.  Constipation.  Fatigue.  Muscle cramps.  Mental confusion.  Skipped heartbeats or irregular heartbeat (palpitations).  Tingling or numbness. How is this diagnosed? This condition is diagnosed with a blood test. How is this treated? This condition may be treated by:  Taking potassium supplements by mouth.  Adjusting the medicines that you take.  Eating more foods that contain a lot of potassium. If your potassium level is very low, you may need to get potassium through an IV and be monitored in the hospital. Follow these instructions at home:   Take over-the-counter and prescription medicines only as told by your health care provider. This includes vitamins and supplements.  Eat a healthy diet. A healthy diet includes fresh fruits and vegetables, whole grains, healthy fats, and lean proteins.  If instructed, eat more foods that contain a lot of potassium. This  includes: ?  Nuts, such as peanuts and pistachios. ? Seeds, such as sunflower seeds and pumpkin seeds. ? Peas, lentils, and lima beans. ? Whole grain and bran cereals and breads. ? Fresh fruits and vegetables, such as apricots, avocado, bananas, cantaloupe, kiwi, oranges, tomatoes, asparagus, and potatoes. ? Orange juice. ? Tomato juice. ? Red meats. ? Yogurt.  Keep all follow-up visits as told by your health care provider. This is important. Contact a health care provider if you:  Have weakness that gets worse.  Feel your heart pounding or racing.  Vomit.  Have diarrhea.  Have diabetes (diabetes mellitus) and you have trouble keeping your blood sugar (glucose) in your target range. Get help right away if you:  Have chest pain.  Have shortness of breath.  Have vomiting or diarrhea that lasts for more than 2 days.  Faint. Summary  Hypokalemia means that the amount of potassium in the blood is lower than normal.  This condition is diagnosed with a blood test.  Hypokalemia may be treated by taking potassium supplements, adjusting the medicines that you take, or eating more foods that are high in potassium.  If your potassium level is very low, you may need to get potassium through an IV and be monitored in the hospital. This information is not intended to replace advice given to you by your health care provider. Make sure you discuss any questions you have with your health care provider. Document Revised: 04/26/2018 Document Reviewed: 04/26/2018 Elsevier Patient Education  2020 [employer].

## 2020-08-22 NOTE — MAU Note (Addendum)
Pt stated her sister test for Covid on Monday . Pt started having symptoms 4 days ago. Generalized body ache, cough N/V. Loss of taste and smell. Good fetal movement reported.

## 2020-08-22 NOTE — Progress Notes (Signed)
Pharmacy COVID-19 Monoclonal Antibody Screening  Shelby Duncan was identified as being not hospitalized with symptoms from Covid-19 on admission but an incidental positive PCR has been documented.  The patient may qualify for the use of monoclonal antibodies (mAB) for COVID-19 viral infection to prevent worsening symptoms stemming from Covid-19 infection.  The patient was identified based on a positive COVID-19 PCR and not requiring the use of supplemental oxygen at this time.  This patient meets the FDA criteria for Emergency Use Authorization of casirivimab/imdevimab or bamlanivimab/etesevimab.  Has a (+) direct SARS-CoV-2 viral test result  Is NOT hospitalized due to COVID-19  Is within 10 days of symptom onset  Has at least one of the high risk factor(s) for progression to severe COVID-19 and/or hospitalization as defined in EUA.  Specific high risk criteria : BMI > 25 and Pregnancy  Additionally: The patient has not had a positive COVID-19 PCR in the last 90 days.  The patient is unvaccinated against COVID-19.  Since the patient is unvaccinated and meets high risk criteria, the patient is eligible for mAB administration.   This eligibility and indication for treatment was discussed with the patient's provider: Edd Arbour, CNM  Plan: Based on the above discussion, it was decided that the patient will receive one dose of the available COVID-19 mAB combination. Pharmacy will coordinate administration timing with patient's nurse. Recommended infusion monitoring parameters communicated to the nursing team.   Alfonso Patten 08/22/2020  7:16 PM

## 2020-09-11 ENCOUNTER — Ambulatory Visit (INDEPENDENT_AMBULATORY_CARE_PROVIDER_SITE_OTHER): Payer: Medicaid Other | Admitting: Obstetrics

## 2020-09-11 ENCOUNTER — Other Ambulatory Visit: Payer: Self-pay

## 2020-09-11 ENCOUNTER — Encounter: Payer: Self-pay | Admitting: Obstetrics

## 2020-09-11 VITALS — BP 114/74 | HR 91 | Wt 213.0 lb

## 2020-09-11 DIAGNOSIS — M549 Dorsalgia, unspecified: Secondary | ICD-10-CM

## 2020-09-11 DIAGNOSIS — Z3480 Encounter for supervision of other normal pregnancy, unspecified trimester: Secondary | ICD-10-CM

## 2020-09-11 MED ORDER — COMFORT FIT MATERNITY SUPP SM MISC: 0 refills | Status: DC

## 2020-09-11 NOTE — Progress Notes (Signed)
Subjective:  Shelby Duncan is a 29 y.o. [phone] at [redacted]w[redacted]d being seen today for ongoing prenatal care.  She is currently monitored for the following issues for this low-risk pregnancy and has Twin pregnancy w antenatal problem; Vanishing twin syndrome; Depression; Schizophrenia (HCC); Pregnancy in multigravida; Acute cholecystitis; Supervision of other normal pregnancy, antepartum; and Chlamydia infection, current pregnancy on their problem list.  Patient reports backache.  Contractions: Irritability. Vag. Bleeding: None.  Movement: Present. Denies leaking of fluid.   The following portions of the patient's history were reviewed and updated as appropriate: allergies, current medications, past family history, past medical history, past social history, past surgical history and problem list. Problem list updated.  Objective:   Vitals:   09/11/20 1002  BP: 114/74  Pulse: 91  Weight: 213 lb (96.6 kg)    Fetal Status:     Movement: Present     General:  Alert, oriented and cooperative. Patient is in no acute distress.  Skin: Skin is warm and dry. No rash noted.   Cardiovascular: Normal heart rate noted  Respiratory: Normal respiratory effort, no problems with respiration noted  Abdomen: Soft, gravid, appropriate for gestational age. Pain/Pressure: Present     Pelvic:  Cervical exam deferred        Extremities: Normal range of motion.     Mental Status: Normal mood and affect. Normal behavior. Normal judgment and thought content.   Urinalysis:      Assessment and Plan:  Pregnancy: [id number] at [redacted]w[redacted]d  1. Supervision of other normal pregnancy, antepartum  2. Backache symptom Rx: - Elastic Bandages & Supports (COMFORT FIT MATERNITY SUPP SM) MISC; Wear as directed.  Dispense: 1 each; Refill: 0   Preterm labor symptoms and general obstetric precautions including but not limited to vaginal bleeding, contractions, leaking of fluid and fetal movement were reviewed in detail with the  patient. Please refer to After Visit Summary for other counseling recommendations.   Return in about 2 weeks (around 09/25/2020) for MyChart.   Brock Bad, MD  09/11/20

## 2020-09-26 ENCOUNTER — Other Ambulatory Visit: Payer: Self-pay

## 2020-09-26 ENCOUNTER — Inpatient Hospital Stay (HOSPITAL_COMMUNITY)
Admission: AD | Admit: 2020-09-26 | Discharge: 2020-09-26 | Disposition: A | Discharge status: Home / Self Care | Payer: Medicaid Other | Attending: Family Medicine | Admitting: Family Medicine

## 2020-09-26 ENCOUNTER — Encounter (HOSPITAL_COMMUNITY): Payer: Self-pay | Admitting: Family Medicine

## 2020-09-26 DIAGNOSIS — Z3689 Encounter for other specified antenatal screening: Secondary | ICD-10-CM

## 2020-09-26 DIAGNOSIS — O4703 False labor before 37 completed weeks of gestation, third trimester: Secondary | ICD-10-CM | POA: Insufficient documentation

## 2020-09-26 DIAGNOSIS — Z87891 Personal history of nicotine dependence: Secondary | ICD-10-CM | POA: Insufficient documentation

## 2020-09-26 DIAGNOSIS — Z3A33 33 weeks gestation of pregnancy: Secondary | ICD-10-CM | POA: Diagnosis not present

## 2020-09-26 LAB — URINALYSIS, ROUTINE W REFLEX MICROSCOPIC
Bilirubin Urine: NEGATIVE
Glucose, UA: NEGATIVE mg/dL
Hgb urine dipstick: NEGATIVE
Ketones, ur: NEGATIVE mg/dL
Nitrite: NEGATIVE
Protein, ur: NEGATIVE mg/dL
Specific Gravity, Urine: 1.025 (ref 1.005–1.030)
WBC, UA: >50 WBC/hpf — ABNORMAL HIGH (ref 0–5)
pH: 6 (ref 5.0–8.0)

## 2020-09-26 MED ORDER — LACTATED RINGERS IV BOLUS
1000.0000 mL | Freq: Once | INTRAVENOUS | Status: AC
  Administered 2020-09-26: 1000 mL via INTRAVENOUS

## 2020-09-26 MED ORDER — CYCLOBENZAPRINE HCL 5 MG PO TABS
10.0000 mg | ORAL_TABLET | Freq: Once | ORAL | Status: AC
  Administered 2020-09-26: 10 mg via ORAL
  Filled 2020-09-26: qty 2

## 2020-09-26 MED ORDER — COMFORT FIT MATERNITY SUPP SM MISC: 0 refills | Status: DC

## 2020-09-26 NOTE — MAU Note (Signed)
.   Shelby Duncan is a 29 y.o. at [redacted]w[redacted]d here in MAU reporting: cramping that is intermittent in her lower abdomen that started x3 days ago. States she is not having the pain now. Denies VB, LOF, or recent intercourse. Endorses fetal movement.   Pain score: 0 Vitals:   09/26/20 1526 09/26/20 1528  BP: 128/68 119/62  Pulse: 89 81  Resp: 18   Temp: 97.9 F (36.6 C)      FHT:160 Lab orders placed from triage: UA

## 2020-09-26 NOTE — Discharge Instructions (Signed)
Pick up a maternity support belt at: [facility] or  [employer]  Drink at least 8 8-oz glasses of water every day. Keep your appointment in the office and call for an additional appointment if you are having problems.      Braxton Hicks Contractions Contractions of the uterus can occur throughout pregnancy, but they are not always a sign that you are in labor. You may have practice contractions called Braxton Hicks contractions. These false labor contractions are sometimes confused with true labor. What are Deberah Pelton contractions? Braxton Hicks contractions are tightening movements that occur in the muscles of the uterus before labor. Unlike true labor contractions, these contractions do not result in opening (dilation) and thinning of the cervix. Toward the end of pregnancy (32-34 weeks), Braxton Hicks contractions can happen more often and may become stronger. These contractions are sometimes difficult to tell apart from true labor because they can be very uncomfortable. You should not feel embarrassed if you go to the hospital with false labor. Sometimes, the only way to tell if you are in true labor is for your health care provider to look for changes in the cervix. The health care provider will do a physical exam and may monitor your contractions. If you are not in true labor, the exam should show that your cervix is not dilating and your water has not broken. If there are no other health problems associated with your pregnancy, it is completely safe for you to be sent home with false labor. You may continue to have Braxton Hicks contractions until you go into true labor. How to tell the difference between true labor and false labor True labor  Contractions last 30-70 seconds.  Contractions become very regular.  Discomfort is usually felt in the top of the uterus, and it spreads to the lower abdomen and low back.  Contractions do not go away with  walking.  Contractions usually become more intense and increase in frequency.  The cervix dilates and gets thinner. False labor  Contractions are usually shorter and not as strong as true labor contractions.  Contractions are usually irregular.  Contractions are often felt in the front of the lower abdomen and in the groin.  Contractions may go away when you walk around or change positions while lying down.  Contractions get weaker and are shorter-lasting as time goes on.  The cervix usually does not dilate or become thin. Follow these instructions at home:   Take over-the-counter and prescription medicines only as told by your health care provider.  Keep up with your usual exercises and follow other instructions from your health care provider.  Eat and drink lightly if you think you are going into labor.  If Braxton Hicks contractions are making you uncomfortable: ? Change your position from lying down or resting to walking, or change from walking to resting. ? Sit and rest in a tub of warm water. ? Drink enough fluid to keep your urine pale yellow. Dehydration may cause these contractions. ? Do slow and deep breathing several times an hour.  Keep all follow-up prenatal visits as told by your health care provider. This is important. Contact a health care provider if:  You have a fever.  You have continuous pain in your abdomen. Get help right away if:  Your contractions become stronger, more regular, and closer together.  You have fluid leaking or gushing from your vagina.  You pass blood-tinged mucus (bloody show).  You have bleeding from  your vagina.  You have low back pain that you never had before.  You feel your baby's head pushing down and causing pelvic pressure.  Your baby is not moving inside you as much as it used to. Summary  Contractions that occur before labor are called Braxton Hicks contractions, false labor, or practice contractions.  Braxton  Hicks contractions are usually shorter, weaker, farther apart, and less regular than true labor contractions. True labor contractions usually become progressively stronger and regular, and they become more frequent.  Manage discomfort from [facility] contractions by changing position, resting in a warm bath, drinking plenty of water, or practicing deep breathing. This information is not intended to replace advice given to you by your health care provider. Make sure you discuss any questions you have with your health care provider. Document Revised: 08/26/2017 Document Reviewed: 01/27/2017 Elsevier Patient Education  2020 [employer].

## 2020-09-26 NOTE — MAU Provider Note (Signed)
History     CSN: [id number]  Arrival date and time: 09/26/20 1514   Event Date/Time   First Provider Initiated Contact with Patient 09/26/20 1540      Chief Complaint  Patient presents with  . Abdominal Pain   HPI Shelby Duncan 29 y.o. [id number] to MAU as she has been having contractions and some abdominal pain.  Wanted to be checked to make sure every thing is OK.  Today had to hold her lower abdomen as the pain was severe for a bit. Has had one glass of mild to drink today.  Has been busy working this season.  OB History    Gravida  4   Para  2   Term  2   Preterm  0   AB  1   Living  2     SAB  1   IAB  0   Ectopic  0   Multiple  0   Live Births  2           Past Medical History:  Diagnosis Date  . Depression   . Infection    UTI  . OCD (obsessive compulsive disorder)   . PTSD (post-traumatic stress disorder)     Past Surgical History:  Procedure Laterality Date  . CHOLECYSTECTOMY N/A 09/17/2019   Procedure: LAPAROSCOPIC CHOLECYSTECTOMY WITH INTRAOPERATIVE CHOLANGIOGRAM;  Surgeon: TDonnie Mesa MD;  [facility]  Service: General;  Laterality: N/A;  . NO PAST SURGERIES      Family History  Problem Relation Age of Onset  . Cancer Maternal Grandmother        breast  . Cancer Paternal Grandfather     Social History   Tobacco Use  . Smoking status: Former Smoker    Types: E-cigarettes    Quit date: 09/16/2019    Years since quitting: 1.0  . Smokeless tobacco: Never Used  . Tobacco comment: age 29 Vaping Use  . Vaping Use: Never used  Substance Use Topics  . Alcohol use: No  . Drug use: Yes    Types: Marijuana    Comment: quite several weeks ago    Allergies:  Allergies  Allergen Reactions  . Chocolate Anaphylaxis and Rash  . 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    . Metrogel [Metronidazole] Swelling and Other (See Comments)    Reaction:  Swelling of  vulva   . Shellfish Allergy Swelling and Rash    Medications Prior to Admission  Medication Sig Dispense Refill Last Dose  . Blood Pressure Monitoring (BLOOD PRESSURE KIT) DEVI 1 kit by Does not apply route once a week. Check Blood Pressure regularly and record readings into the [employer] App.  Large Cuff.  DX O90.0 1 each 0   . Elastic Bandages & Supports (COMFORT FIT MATERNITY SUPP SM) MISC Wear as directed. 1 each 0   . ferrous sulfate 325 (65 FE) MG tablet Take 1 tablet (325 mg total) by mouth every other day. Take with breakfast (Patient not taking: Reported on 09/11/2020) 30 tablet 2   . Prenatal Vit-Fe Fumarate-FA (PRENATAL MULTIVITAMIN) TABS tablet Take 1 tablet by mouth daily at 12 noon. (Patient not taking: Reported on 09/11/2020)       Review of Systems  Constitutional: Negative for fever.  Gastrointestinal: Positive for abdominal pain. Negative for nausea and vomiting.  Genitourinary: Negative for dysuria, vaginal bleeding and vaginal discharge.   Physical Exam  Blood pressure 119/62, pulse 81, temperature 97.9 F (36.6 C), temperature source Oral, resp. rate 18, last menstrual period 01/21/2020.  Physical Exam Vitals and nursing note reviewed.  Constitutional:      Appearance: She is well-developed and well-nourished.  HENT:     Head: Normocephalic.  Eyes:     Extraocular Movements: EOM normal.  Abdominal:     Palpations: Abdomen is soft.     Tenderness: There is no abdominal tenderness. There is no guarding or rebound.     Comments: FHT 145 baseline with moderate variability and 15x15 accels noted.  Contractions 5-10 minutes and uterine irritability noted.  No decelerations.  Category 1 strip.  Genitourinary:    Comments: Cervical exam - FT but multiparous cervix, vertex, ballotable, no bleeding Musculoskeletal:        General: Normal range of motion.     Cervical back: Neck supple.  Skin:    General: Skin is warm and dry.  Neurological:     Mental Status:  She is alert and oriented to person, place, and time.  Psychiatric:        Mood and Affect: Mood and affect normal.     MAU Course  Procedures LABS Results for orders placed or performed during the hospital encounter of 09/26/20 (from the past 24 hour(s))  Urinalysis, Routine w reflex microscopic Urine, Clean Catch     Status: Abnormal   Collection Time: 09/26/20  3:30 PM  Result Value Ref Range   Color, Urine YELLOW YELLOW   APPearance CLOUDY (A) CLEAR   Specific Gravity, Urine 1.025 1.005 - 1.030   pH 6.0 5.0 - 8.0   Glucose, UA NEGATIVE NEGATIVE mg/dL   Hgb urine dipstick NEGATIVE NEGATIVE   Bilirubin Urine NEGATIVE NEGATIVE   Ketones, ur NEGATIVE NEGATIVE mg/dL   Protein, ur NEGATIVE NEGATIVE mg/dL   Nitrite NEGATIVE NEGATIVE   Leukocytes,Ua LARGE (A) NEGATIVE   RBC / HPF 0-5 0 - 5 RBC/hpf   WBC, UA >50 (H) 0 - 5 WBC/hpf   Bacteria, UA FEW (A) NONE SEEN   Squamous Epithelial / LPF 21-50 0 - 5   Mucus PRESENT      MDM Urine culture pending to rule out UTI IVF bolus and flexeril 10 mg PO given for pain  Assessment and Plan  Threatened preterm labor Category 1 monitor strip  Plan Drink at least 8 8-oz glasses of water every day. Keep your appointment in the office on Monday. Urine culture pending. Call the office or return with worsening symptoms. See AVS for additional info given to patient.  Shelby Duncan Shelby Duncan 09/26/2020, 4:21 PM

## 2020-09-27 LAB — CULTURE, OB URINE: Culture: NO GROWTH

## 2020-09-27 NOTE — L&D Delivery Note (Signed)
Delivery Note Called to bedside and patient complete and pushing. At 2:54 AM a viable female was delivered via Vaginal, Spontaneous (Presentation: Left Occiput Anterior).  Nuchal cord loose x1, delivered through. APGAR: 8, 9; weight pending.   Cord avulsion noted at delivery. Placenta delivered spontaneously with maternal pushing. Placenta status: Spontaneous, Intact.  Cord: 3 vessels with the following complications: cord avulsion. Placenta sent to pathology.  Anesthesia: None Episiotomy: None Lacerations: None Est. Blood Loss (mL): 33mL  Mom to postpartum.  Baby to Couplet care / Skin to Skin.  [name] 10/28/2020, 3:15 AM

## 2020-09-29 ENCOUNTER — Encounter: Payer: Medicaid Other | Admitting: Obstetrics

## 2020-10-02 ENCOUNTER — Encounter: Payer: Self-pay | Admitting: Obstetrics

## 2020-10-02 ENCOUNTER — Ambulatory Visit (INDEPENDENT_AMBULATORY_CARE_PROVIDER_SITE_OTHER): Payer: Medicaid Other | Admitting: Obstetrics

## 2020-10-02 ENCOUNTER — Other Ambulatory Visit: Payer: Self-pay

## 2020-10-02 VITALS — BP 133/79 | HR 98 | Wt 228.5 lb

## 2020-10-02 DIAGNOSIS — Z3480 Encounter for supervision of other normal pregnancy, unspecified trimester: Secondary | ICD-10-CM

## 2020-10-02 DIAGNOSIS — Z3A34 34 weeks gestation of pregnancy: Secondary | ICD-10-CM

## 2020-10-02 NOTE — Progress Notes (Signed)
Pt presents for ROB c/o painful BH's. Denies LOF, VB

## 2020-10-02 NOTE — Progress Notes (Signed)
Subjective:  Shelby Duncan is a 30 y.o. [phone] at [redacted]w[redacted]d being seen today for ongoing prenatal care.  She is currently monitored for the following issues for this low-risk pregnancy and has Twin pregnancy w antenatal problem; Vanishing twin syndrome; Depression; Schizophrenia (HCC); Pregnancy in multigravida; Acute cholecystitis; Supervision of other normal pregnancy, antepartum; and Chlamydia infection, current pregnancy on their problem list.  Patient reports occasional contractions.  Contractions: Irritability. Vag. Bleeding: None.  Movement: Present. Denies leaking of fluid.   The following portions of the patient's history were reviewed and updated as appropriate: allergies, current medications, past family history, past medical history, past social history, past surgical history and problem list. Problem list updated.  Objective:   Vitals:   10/02/20 1606  BP: 133/79  Pulse: 98  Weight: 228 lb 8 oz (103.6 kg)    Fetal Status:     Movement: Present     General:  Alert, oriented and cooperative. Patient is in no acute distress.  Skin: Skin is warm and dry. No rash noted.   Cardiovascular: Normal heart rate noted  Respiratory: Normal respiratory effort, no problems with respiration noted  Abdomen: Soft, gravid, appropriate for gestational age. Pain/Pressure: Present     Pelvic:  Cervical exam deferred        Extremities: Normal range of motion.  Edema: None  Mental Status: Normal mood and affect. Normal behavior. Normal judgment and thought content.   Urinalysis:      Assessment and Plan:  Pregnancy: [id number] at [redacted]w[redacted]d  1. Supervision of other normal pregnancy, antepartum  2. [redacted] weeks gestation of pregnancy   Preterm labor symptoms and general obstetric precautions including but not limited to vaginal bleeding, contractions, leaking of fluid and fetal movement were reviewed in detail with the patient. Please refer to After Visit Summary for other counseling recommendations.    Return in about 2 weeks (around 10/16/2020) for ROB.   Brock Bad, MD  10/02/20

## 2020-10-10 ENCOUNTER — Telehealth: Payer: Self-pay

## 2020-10-10 NOTE — Telephone Encounter (Signed)
Called to advise paperwork is complete but need out of work dates, no answer, left vm

## 2020-10-15 ENCOUNTER — Telehealth: Payer: Self-pay

## 2020-10-15 NOTE — Telephone Encounter (Signed)
Called again to ask about dates for FMLA, left vm

## 2020-10-16 ENCOUNTER — Ambulatory Visit (INDEPENDENT_AMBULATORY_CARE_PROVIDER_SITE_OTHER): Payer: Medicaid Other

## 2020-10-16 ENCOUNTER — Other Ambulatory Visit (HOSPITAL_COMMUNITY)
Admission: RE | Admit: 2020-10-16 | Discharge: 2020-10-16 | Disposition: A | Discharge status: Home / Self Care | Payer: Medicaid Other | Source: Ambulatory Visit

## 2020-10-16 ENCOUNTER — Other Ambulatory Visit: Payer: Self-pay

## 2020-10-16 VITALS — BP 134/70 | HR 96 | Wt 233.0 lb

## 2020-10-16 DIAGNOSIS — Z3480 Encounter for supervision of other normal pregnancy, unspecified trimester: Secondary | ICD-10-CM | POA: Insufficient documentation

## 2020-10-16 DIAGNOSIS — Z3A36 36 weeks gestation of pregnancy: Secondary | ICD-10-CM

## 2020-10-16 NOTE — Progress Notes (Signed)
   HIGH-RISK PREGNANCY OFFICE VISIT  Patient name: Shelby Duncan MRN [id number]  Date of birth: 09-Mar-1991 Chief Complaint:   Routine Prenatal Visit  Subjective:   Shelby Duncan is a 30 y.o. [phone] female at [redacted]w[redacted]d with an Estimated Date of Delivery: 11/10/20 being seen today for ongoing management of a high-risk pregnancy aeb has Twin pregnancy w antenatal problem; Vanishing twin syndrome; Depression; Schizophrenia (HCC); Pregnancy in multigravida; Acute cholecystitis; Supervision of other normal pregnancy, antepartum; and Chlamydia infection, current pregnancy on their problem list.  Patient has no pregnancy related concerns and endorses fetal movement.  She denies contractions and vaginal concerns including discharge, bleeding, leaking, odor, itching, or irritation. Patient questions when she can come out of work and states she works at both [employer] and [employer]. Contractions: Irregular. Vag. Bleeding: None.  Movement: Present.  Reviewed past medical,surgical, social, obstetrical and family history as well as problem list, medications and allergies.  Objective   Vitals:   10/16/20 1351  BP: 134/70  Pulse: 96  Weight: 233 lb (105.7 kg)  Body mass index is 34.41 kg/m.  Total Weight Gain:53 lb (24 kg)         Physical Examination:   General appearance: Well appearing, and in no distress  Mental status: Alert, oriented to person, place, and time  Skin: Warm & dry  Cardiovascular: Normal heart rate noted  Respiratory: Normal respiratory effort, no distress  Abdomen: Soft, gravid, nontender, AGA with Fundal height of Fundal Height: 36 cm  Pelvic: Cervical exam performed  Dilation: 2 Effacement (%): 50 Station: Ballotable Presentation: Vertex  Extremities: Edema: Trace  Fetal Status: Fetal Heart Rate (bpm): 157  Movement: Present   No results found for this or any previous visit (from the past 24 hour(s)).  Assessment & Plan:  High-risk pregnancy of a 30 y.o., [id number] at  [redacted]w[redacted]d with an Estimated Date of Delivery: 11/10/20   1. Supervision of other normal pregnancy, antepartum -Anticipatory guidance for upcoming appts. -Patient to next appt in 1 week as an in-person visit. Informed that if she needs to do virtual she can notify staff about one hour prior to scheduled visit. -Discussed maternity leave and informed that it is usually not taken until day of delivery unless circumstances arise prior to that. -Patient informed that she has no medical reason for early leave and that it would have to be at the discretion of her employer.  2. [redacted] weeks gestation of pregnancy -Doing well overall. -Labs collected. -Discussed exam findings.    Meds: No orders of the defined types were placed in this encounter.  Labs/procedures today:   Lab Orders     Strep Gp B NAA   Reviewed: Term labor symptoms and general obstetric precautions including but not limited to vaginal bleeding, contractions, leaking of fluid and fetal movement were reviewed in detail with the patient.  All questions were answered.  Follow-up: Return in about 1 week (around 10/23/2020) for HROB.  Orders Placed This Encounter  Procedures  . Strep Gp B NAA   Cherre Robins MSN, CNM 10/16/2020

## 2020-10-16 NOTE — Progress Notes (Signed)
ROB/GBS,  Reports no problems today.

## 2020-10-16 NOTE — Patient Instructions (Signed)

## 2020-10-17 LAB — CERVICOVAGINAL ANCILLARY ONLY
Chlamydia: NEGATIVE
Comment: NEGATIVE
Comment: NORMAL
Neisseria Gonorrhea: NEGATIVE

## 2020-10-18 DIAGNOSIS — B951 Streptococcus, group B, as the cause of diseases classified elsewhere: Secondary | ICD-10-CM | POA: Insufficient documentation

## 2020-10-18 LAB — STREP GP B NAA: Strep Gp B NAA: POSITIVE — AB

## 2020-10-22 ENCOUNTER — Other Ambulatory Visit: Payer: Self-pay

## 2020-10-22 ENCOUNTER — Encounter: Payer: Self-pay | Admitting: Obstetrics and Gynecology

## 2020-10-22 ENCOUNTER — Ambulatory Visit (INDEPENDENT_AMBULATORY_CARE_PROVIDER_SITE_OTHER): Payer: Medicaid Other | Admitting: Obstetrics and Gynecology

## 2020-10-22 VITALS — BP 128/74 | HR 88 | Wt 220.0 lb

## 2020-10-22 DIAGNOSIS — Z8616 Personal history of COVID-19: Secondary | ICD-10-CM

## 2020-10-22 DIAGNOSIS — Z9049 Acquired absence of other specified parts of digestive tract: Secondary | ICD-10-CM | POA: Insufficient documentation

## 2020-10-22 DIAGNOSIS — Z3480 Encounter for supervision of other normal pregnancy, unspecified trimester: Secondary | ICD-10-CM

## 2020-10-22 DIAGNOSIS — B951 Streptococcus, group B, as the cause of diseases classified elsewhere: Secondary | ICD-10-CM

## 2020-10-22 NOTE — Progress Notes (Signed)
HOB, reports no problems today. 

## 2020-10-22 NOTE — Progress Notes (Signed)
Subjective:  Shelby Duncan is a 30 y.o. [phone] at [redacted]w[redacted]d being seen today for ongoing prenatal care.  She is currently monitored for the following issues for this low-risk pregnancy and has Depression; Schizophrenia (HCC); Pregnancy in multigravida; Supervision of other normal pregnancy, antepartum; Positive GBS test; History of COVID-19; and History of cholecystectomy on their problem list.  Patient reports general discomforts of pregnancy.  Contractions: Irregular. Vag. Bleeding: None.  Movement: Present. Denies leaking of fluid.   The following portions of the patient's history were reviewed and updated as appropriate: allergies, current medications, past family history, past medical history, past social history, past surgical history and problem list. Problem list updated.  Objective:   Vitals:   10/22/20 1558  BP: 128/74  Pulse: 88  Weight: 220 lb (99.8 kg)    Fetal Status: Fetal Heart Rate (bpm): 148   Movement: Present     General:  Alert, oriented and cooperative. Patient is in no acute distress.  Skin: Skin is warm and dry. No rash noted.   Cardiovascular: Normal heart rate noted  Respiratory: Normal respiratory effort, no problems with respiration noted  Abdomen: Soft, gravid, appropriate for gestational age. Pain/Pressure: Present     Pelvic:  Cervical exam performed        Extremities: Normal range of motion.  Edema: Trace  Mental Status: Normal mood and affect. Normal behavior. Normal judgment and thought content.   Urinalysis:      Assessment and Plan:  Pregnancy: [id number] at [redacted]w[redacted]d  1. Supervision of other normal pregnancy, antepartum Labor precautions - [place] MFM OB FOLLOW UP; Future  2. Positive GBS test Treat while in labor  3. History of COVID-19 Growth scan ordered - [place] MFM OB FOLLOW UP; Future  Term labor symptoms and general obstetric precautions including but not limited to vaginal bleeding, contractions, leaking of fluid and fetal movement were  reviewed in detail with the patient. Please refer to After Visit Summary for other counseling recommendations.  Return in about 1 week (around 10/29/2020) for OB visit, face to face, any provider.   Hermina Staggers, MD

## 2020-10-22 NOTE — Patient Instructions (Signed)

## 2020-10-27 ENCOUNTER — Other Ambulatory Visit: Payer: Self-pay

## 2020-10-27 ENCOUNTER — Inpatient Hospital Stay (HOSPITAL_COMMUNITY)
Admission: AD | Admit: 2020-10-27 | Discharge: 2020-10-29 | DRG: 807 | Disposition: A | Discharge status: Home / Self Care | Payer: Medicaid Other | Attending: Obstetrics and Gynecology | Admitting: Obstetrics and Gynecology

## 2020-10-27 ENCOUNTER — Telehealth: Payer: Self-pay

## 2020-10-27 ENCOUNTER — Encounter (HOSPITAL_COMMUNITY): Payer: Self-pay | Admitting: Obstetrics and Gynecology

## 2020-10-27 DIAGNOSIS — Z3A38 38 weeks gestation of pregnancy: Secondary | ICD-10-CM | POA: Diagnosis not present

## 2020-10-27 DIAGNOSIS — O26893 Other specified pregnancy related conditions, third trimester: Secondary | ICD-10-CM | POA: Diagnosis present

## 2020-10-27 DIAGNOSIS — B951 Streptococcus, group B, as the cause of diseases classified elsewhere: Secondary | ICD-10-CM | POA: Diagnosis present

## 2020-10-27 DIAGNOSIS — O9902 Anemia complicating childbirth: Secondary | ICD-10-CM | POA: Diagnosis present

## 2020-10-27 DIAGNOSIS — O99824 Streptococcus B carrier state complicating childbirth: Secondary | ICD-10-CM | POA: Diagnosis present

## 2020-10-27 DIAGNOSIS — R87619 Unspecified abnormal cytological findings in specimens from cervix uteri: Secondary | ICD-10-CM | POA: Diagnosis present

## 2020-10-27 DIAGNOSIS — O411230 Chorioamnionitis, third trimester, not applicable or unspecified: Secondary | ICD-10-CM | POA: Diagnosis not present

## 2020-10-27 DIAGNOSIS — Z3490 Encounter for supervision of normal pregnancy, unspecified, unspecified trimester: Secondary | ICD-10-CM

## 2020-10-27 DIAGNOSIS — Z8616 Personal history of COVID-19: Secondary | ICD-10-CM | POA: Diagnosis present

## 2020-10-27 DIAGNOSIS — D649 Anemia, unspecified: Secondary | ICD-10-CM | POA: Diagnosis present

## 2020-10-27 DIAGNOSIS — F32A Depression, unspecified: Secondary | ICD-10-CM | POA: Diagnosis present

## 2020-10-27 DIAGNOSIS — F209 Schizophrenia, unspecified: Secondary | ICD-10-CM | POA: Diagnosis present

## 2020-10-27 DIAGNOSIS — Z3480 Encounter for supervision of other normal pregnancy, unspecified trimester: Secondary | ICD-10-CM

## 2020-10-27 LAB — TYPE AND SCREEN
ABO/RH(D): B POS
Antibody Screen: NEGATIVE

## 2020-10-27 LAB — COMPREHENSIVE METABOLIC PANEL
ALT: 11 U/L (ref 0–44)
AST: 17 U/L (ref 15–41)
Albumin: 2.7 g/dL — ABNORMAL LOW (ref 3.5–5.0)
Alkaline Phosphatase: 95 U/L (ref 38–126)
Anion gap: 9 (ref 5–15)
BUN: 5 mg/dL — ABNORMAL LOW (ref 6–20)
CO2: 20 mmol/L — ABNORMAL LOW (ref 22–32)
Calcium: 8.6 mg/dL — ABNORMAL LOW (ref 8.9–10.3)
Chloride: 104 mmol/L (ref 98–111)
Creatinine, Ser: 0.58 mg/dL (ref 0.44–1.00)
GFR, Estimated: >60 mL/min (ref >60)
Glucose, Bld: 83 mg/dL (ref 70–99)
Potassium: 3.9 mmol/L (ref 3.5–5.1)
Sodium: 133 mmol/L — ABNORMAL LOW (ref 135–145)
Total Bilirubin: 0.7 mg/dL (ref 0.3–1.2)
Total Protein: 6.5 g/dL (ref 6.5–8.1)

## 2020-10-27 LAB — CBC
HCT: 31 % — ABNORMAL LOW (ref 36.0–46.0)
HCT: 29.7 % — ABNORMAL LOW (ref 36.0–46.0)
Hemoglobin: 10 g/dL — ABNORMAL LOW (ref 12.0–15.0)
Hemoglobin: 9.2 g/dL — ABNORMAL LOW (ref 12.0–15.0)
MCH: 26.5 pg (ref 26.0–34.0)
MCH: 25.8 pg — ABNORMAL LOW (ref 26.0–34.0)
MCHC: 32.3 g/dL (ref 30.0–36.0)
MCHC: 31 g/dL (ref 30.0–36.0)
MCV: 82.2 fL (ref 80.0–100.0)
MCV: 83.2 fL (ref 80.0–100.0)
Platelets: 356 10*3/uL (ref 150–400)
Platelets: 306 10*3/uL (ref 150–400)
RBC: 3.77 MIL/uL — ABNORMAL LOW (ref 3.87–5.11)
RBC: 3.57 MIL/uL — ABNORMAL LOW (ref 3.87–5.11)
RDW: 15.5 % (ref 11.5–15.5)
RDW: 15.4 % (ref 11.5–15.5)
WBC: 16.8 10*3/uL — ABNORMAL HIGH (ref 4.0–10.5)
WBC: 17.4 10*3/uL — ABNORMAL HIGH (ref 4.0–10.5)
nRBC: 0 % (ref 0.0–0.2)
nRBC: 0 % (ref 0.0–0.2)

## 2020-10-27 MED ORDER — LIDOCAINE HCL (PF) 1 % IJ SOLN: 30.0000 mL | INTRAMUSCULAR | Status: DC | PRN

## 2020-10-27 MED ORDER — OXYTOCIN-SODIUM CHLORIDE 30-0.9 UT/500ML-% IV SOLN
2.5000 [IU]/h | INTRAVENOUS | Status: DC
  Filled 2020-10-27: qty 500

## 2020-10-27 MED ORDER — SODIUM CHLORIDE 0.9 % IV SOLN
5.0000 10*6.[IU] | Freq: Once | INTRAVENOUS | Status: AC
  Administered 2020-10-27: 5 10*6.[IU] via INTRAVENOUS
  Filled 2020-10-27: qty 5

## 2020-10-27 MED ORDER — LACTATED RINGERS IV SOLN
INTRAVENOUS | Status: DC
  Administered 2020-10-27: 125 mL/h via INTRAVENOUS

## 2020-10-27 MED ORDER — FENTANYL CITRATE (PF) 100 MCG/2ML IJ SOLN
50.0000 ug | INTRAMUSCULAR | Status: DC | PRN
  Administered 2020-10-28: 100 ug via INTRAVENOUS
  Filled 2020-10-27: qty 2

## 2020-10-27 MED ORDER — ONDANSETRON HCL 4 MG/2ML IJ SOLN: 4.0000 mg | Freq: Four times a day (QID) | INTRAMUSCULAR | Status: DC | PRN

## 2020-10-27 MED ORDER — ACETAMINOPHEN 325 MG PO TABS: 650.0000 mg | ORAL_TABLET | ORAL | Status: DC | PRN

## 2020-10-27 MED ORDER — SOD CITRATE-CITRIC ACID 500-334 MG/5ML PO SOLN: 30.0000 mL | ORAL | Status: DC | PRN

## 2020-10-27 MED ORDER — OXYTOCIN BOLUS FROM INFUSION
333.0000 mL | Freq: Once | INTRAVENOUS | Status: AC
  Administered 2020-10-28: 333 mL via INTRAVENOUS

## 2020-10-27 MED ORDER — PENICILLIN G POT IN DEXTROSE 60000 UNIT/ML IV SOLN
3.0000 10*6.[IU] | INTRAVENOUS | Status: DC
  Administered 2020-10-27 – 2020-10-28 (×2): 3 10*6.[IU] via INTRAVENOUS
  Filled 2020-10-27 (×2): qty 50

## 2020-10-27 MED ORDER — LACTATED RINGERS IV SOLN: 500.0000 mL | INTRAVENOUS | Status: DC | PRN

## 2020-10-27 NOTE — Progress Notes (Signed)
Labor Progress Note ROSSETTA KAMA is a 30 y.o. [id number] at [redacted]w[redacted]d presented for SOL. S: Doing well without complaints, feeling contractions.  O:  BP (!) 143/80   Pulse 97   Temp 99.2 F (37.3 C) (Axillary)   Resp 18   Ht 5\' 9"  (1.753 m)   Wt 99.8 kg   LMP 01/21/2020   SpO2 99%   BMI 32.49 kg/m  EFM: baseline 150bpm/mod variability/+ accels/no decels Toco: q1-5 min  CVE: Dilation: 6 Effacement (%): 50 Station: -2 Presentation: Vertex Exam by:: Dr. [email or web address]   A&P: 30 y.o. 26 [redacted]w[redacted]d presented for SOL. #Labor: Cervical exam unchanged since last check, AROM with clear fluid performed. If patient does not make cervical change will start pitocin at next check, continue expectant management for now. #Pain: PRN #FWB: cat 1 #GBS positive, adequate prophylaxis, PCN #Elevated BP: patient with SBP 130-140s range since admit, no history of elevated BP in pregnancy. PreE labs pending. Asymptomatic.  #Depression: no meds, SW postpartum  [redacted]w[redacted]d, MD 9:19 PM

## 2020-10-27 NOTE — Telephone Encounter (Signed)
Pt returned F/U call regarding leaking and contractions from 2 days ago. Pt having contractions every 30 mins since 4 am.  Notes increased pressure, +FM , No vaginal bleeding,denies any leaking today. Pt advised once contractions are 3-5 mins apart lasting 1 min for 1 hr that may be active labor.  Advised pt on a warm bath, increase water intake, rest , switching sides.  Pt advised she can report to MAU for assurance. Pt has upcoming appt on 10/29/20.

## 2020-10-27 NOTE — H&P (Signed)
OBSTETRIC ADMISSION HISTORY AND PHYSICAL  Shelby Duncan is a 30 y.o. female [phone] with IUP at 95w0dby 2nd trimester UKoreapresenting for early labor. She reports +FMs, No LOF, no VB, no blurry vision, headaches or peripheral edema, and RUQ pain.  She plans on breast feeding. She declines birth control. [facility]--->  Estimated Date of Delivery: 11/10/20  Sono:    _0 , CWD, normal anatomy, cephalic presentation, left posterior placenta, 795g, 47% EFW   Prenatal History/Complications:  -GBS Pos -Hx of Covid (11/26) -ASCUS pap, neg HPV (will need pap PP)   Past Medical History: Past Medical History:  Diagnosis Date   Depression    Infection    UTI   OCD (obsessive compulsive disorder)    PTSD (post-traumatic stress disorder)     Past Surgical History: Past Surgical History:  Procedure Laterality Date   CHOLECYSTECTOMY N/A 09/17/2019   Procedure: LAPAROSCOPIC CHOLECYSTECTOMY WITH INTRAOPERATIVE CHOLANGIOGRAM;  Surgeon: TDonnie Mesa MD;  [facility]  Service: General;  Laterality: N/A;   NO PAST SURGERIES      Obstetrical History: OB History    Gravida  4   Para  2   Term  2   Preterm  0   AB  1   Living  2     SAB  1   IAB  0   Ectopic  0   Multiple  0   Live Births  2           Social History Social History   Socioeconomic History   Marital status: Single    Spouse name: Not on file   Number of children: Not on file   Years of education: Not on file   Highest education level: Not on file  Occupational History   Not on file  Tobacco Use   Smoking status: Never Smoker   Smokeless tobacco: Never Used   Tobacco comment: age 30 Vaping Use   Vaping Use: Never used  Substance and Sexual Activity   Alcohol use: No   Drug use: Yes    Types: Marijuana    Comment: 2021   Sexual activity: Yes    Birth control/protection: None  Other Topics Concern   Not  on file  Social History Narrative   Not on file   Social Determinants of Health   Financial Resource Strain: Not on file  Food Insecurity: Not on file  Transportation Needs: Not on file  Physical Activity: Not on file  Stress: Not on file  Social Connections: Not on file    Family History: Family History  Problem Relation Age of Onset   Cancer Mother        Kidney   Liver disease Mother    Diabetes Father    Cancer Maternal Grandmother        breast   Cancer Paternal Grandfather     Allergies: Allergies  Allergen Reactions   Chocolate Anaphylaxis and Rash   Food Anaphylaxis, Rash and Other (See Comments)    Pt states that she is allergic to oranges.     Metrogel [Metronidazole] Swelling and Other (See Comments)    Reaction:  Swelling of vulva    Shellfish Allergy Swelling and Rash    Medications Prior to Admission  Medication Sig Dispense Refill Last Dose   Blood Pressure Monitoring (BLOOD PRESSURE KIT) DEVI 1 kit by Does not apply route once a week.  Check Blood Pressure regularly and record readings into the Babyscripts App.  Large Cuff.  DX O90.0 (Patient not taking: Reported on 10/02/2020) 1 each 0    ferrous sulfate 325 (65 FE) MG tablet Take 1 tablet (325 mg total) by mouth every other day. Take with breakfast (Patient not taking: No sig reported) 30 tablet 2    Prenatal Vit-Fe Fumarate-FA (PRENATAL MULTIVITAMIN) TABS tablet Take 1 tablet by mouth daily at 12 noon. (Patient not taking: No sig reported)        Review of Systems   All systems reviewed and negative except as stated in HPI  Blood pressure 132/79, pulse 89, temperature 98 F (36.7 C), temperature source Oral, resp. rate 16, height 5' 9" (1.753 m), weight 99.8 kg, last menstrual period 01/21/2020, SpO2 99 %. General appearance: alert, cooperative and appears stated age Lungs: effort normal  Abdomen: soft, non-tender; bowel sounds normal Pelvic: 6/80/-1 Extremities: Homans sign is  negative, no sign of DVT  Presentation: Cephalic Fetal monitoringBaseline: 150 bpm, min variability, intermittent late decels  Uterine activity: q4-5 min  Dilation: 6 Effacement (%): 80 Station: -1 Exam by:: Mahoney   Prenatal labs: ABO, Rh: --/--/B POS (01/31 1510) Antibody: NEG (01/31 1510) Rubella: 1.34 (07/13 1618) RPR: Non Reactive (07/13 1618)  HBsAg: Negative (07/13 1618)  HIV: Non Reactive (07/13 1618)  GBS: Positive/-- (01/20 0216)  2 hr Glucola normal Genetic screening  normal Anatomy US normal  Prenatal Transfer Tool  Maternal Diabetes: No Genetic Screening: Normal Maternal Ultrasounds/Referrals: Normal Fetal Ultrasounds or other Referrals:  None Maternal Substance Abuse:  No Significant Maternal Medications:  None Significant Maternal Lab Results: Group B Strep positive  Results for orders placed or performed during the hospital encounter of 10/27/20 (from the past 24 hour(s))  Type and screen [place]   Collection Time: 10/27/20  3:10 PM  Result Value Ref Range   ABO/RH(D) B POS    Antibody Screen NEG    Sample Expiration      10/30/2020,2359 Performed at [facility], [place] [address] [place], [place] [address]   CBC   Collection Time: 10/27/20  3:17 PM  Result Value Ref Range   WBC 16.8 (H) 4.0 - 10.5 K/uL   RBC 3.77 (L) 3.87 - 5.11 MIL/uL   Hemoglobin 10.0 (L) 12.0 - 15.0 g/dL   HCT 31.0 (L) 36.0 - 46.0 %   MCV 82.2 80.0 - 100.0 fL   MCH 26.5 26.0 - 34.0 pg   MCHC 32.3 30.0 - 36.0 g/dL   RDW 15.5 11.5 - 15.5 %   Platelets 356 150 - 400 K/uL   nRBC 0.0 0.0 - 0.2 %    Patient Active Problem List   Diagnosis Date Noted   Supervision of normal pregnancy 10/27/2020   History of COVID-19 10/22/2020   History of cholecystectomy 10/22/2020   Positive GBS test 10/18/2020   Supervision of other normal pregnancy, antepartum 03/18/2020   Pregnancy in multigravida 06/30/2016   Depression 09/24/2013   Schizophrenia  ([place]) 09/24/2013    Assessment/Plan:  Shelby Duncan is a 30 y.o. [id number] at 35w0dhere for latent labor. Noted to have cat II tracing in MAU, plan for admit and monitor, augment labor if needed.   #Labor: expectant mgmt for now, augmentation prn after PCN administered.  #Pain: Pain meds, epidural prn  #FWB: Cat II in MAU,  #ID:  GBS pos, will start PCN  #MOF: breast #MOC:declines  #Circ:  Yes   #  ASCUS pap: neg HPV, pap PP  ° ° M , MD  °10/27/2020, 4:34 PM ° ° ° °

## 2020-10-27 NOTE — MAU Note (Signed)
CTX every 30 mins since 0400.  Denies LOF/VB.  Endorses + FM.  Reports a lot of pressure with the contractions.

## 2020-10-28 ENCOUNTER — Encounter (HOSPITAL_COMMUNITY): Payer: Self-pay | Admitting: Family Medicine

## 2020-10-28 DIAGNOSIS — Z3A38 38 weeks gestation of pregnancy: Secondary | ICD-10-CM

## 2020-10-28 DIAGNOSIS — O411230 Chorioamnionitis, third trimester, not applicable or unspecified: Secondary | ICD-10-CM

## 2020-10-28 DIAGNOSIS — R87619 Unspecified abnormal cytological findings in specimens from cervix uteri: Secondary | ICD-10-CM | POA: Diagnosis present

## 2020-10-28 LAB — RPR: RPR Ser Ql: NONREACTIVE

## 2020-10-28 MED ORDER — DIBUCAINE (PERIANAL) 1 % EX OINT: 1.0000 "application " | TOPICAL_OINTMENT | CUTANEOUS | Status: DC | PRN

## 2020-10-28 MED ORDER — COCONUT OIL OIL: 1.0000 "application " | TOPICAL_OIL | Status: DC | PRN

## 2020-10-28 MED ORDER — IBUPROFEN 600 MG PO TABS
600.0000 mg | ORAL_TABLET | Freq: Four times a day (QID) | ORAL | Status: DC
  Administered 2020-10-28 – 2020-10-29 (×6): 600 mg via ORAL
  Filled 2020-10-28 (×6): qty 1

## 2020-10-28 MED ORDER — FERROUS SULFATE 325 (65 FE) MG PO TABS
325.0000 mg | ORAL_TABLET | ORAL | Status: DC
  Administered 2020-10-28: 325 mg via ORAL
  Filled 2020-10-28: qty 1

## 2020-10-28 MED ORDER — ONDANSETRON HCL 4 MG/2ML IJ SOLN: 4.0000 mg | INTRAMUSCULAR | Status: DC | PRN

## 2020-10-28 MED ORDER — VITAMIN C 250 MG PO TABS
250.0000 mg | ORAL_TABLET | ORAL | Status: DC
  Administered 2020-10-28: 250 mg via ORAL
  Filled 2020-10-28: qty 1

## 2020-10-28 MED ORDER — SENNOSIDES-DOCUSATE SODIUM 8.6-50 MG PO TABS
2.0000 | ORAL_TABLET | ORAL | Status: DC
  Administered 2020-10-28 – 2020-10-29 (×2): 2 via ORAL
  Filled 2020-10-28 (×2): qty 2

## 2020-10-28 MED ORDER — ACETAMINOPHEN 325 MG PO TABS
650.0000 mg | ORAL_TABLET | ORAL | Status: DC | PRN
  Administered 2020-10-28: 650 mg via ORAL
  Filled 2020-10-28: qty 2

## 2020-10-28 MED ORDER — ONDANSETRON HCL 4 MG PO TABS: 4.0000 mg | ORAL_TABLET | ORAL | Status: DC | PRN

## 2020-10-28 MED ORDER — SIMETHICONE 80 MG PO CHEW: 80.0000 mg | CHEWABLE_TABLET | ORAL | Status: DC | PRN

## 2020-10-28 MED ORDER — TETANUS-DIPHTH-ACELL PERTUSSIS 5-2.5-18.5 LF-MCG/0.5 IM SUSY: 0.5000 mL | PREFILLED_SYRINGE | Freq: Once | INTRAMUSCULAR | Status: DC

## 2020-10-28 MED ORDER — MEASLES, MUMPS & RUBELLA VAC IJ SOLR: 0.5000 mL | Freq: Once | INTRAMUSCULAR | Status: DC

## 2020-10-28 MED ORDER — BENZOCAINE-MENTHOL 20-0.5 % EX AERO
1.0000 "application " | INHALATION_SPRAY | CUTANEOUS | Status: DC | PRN
  Filled 2020-10-28: qty 56

## 2020-10-28 MED ORDER — WITCH HAZEL-GLYCERIN EX PADS
1.0000 | MEDICATED_PAD | CUTANEOUS | Status: DC | PRN
Start: 2020-10-28 — End: 2020-10-30

## 2020-10-28 MED ORDER — DIPHENHYDRAMINE HCL 25 MG PO CAPS: 25.0000 mg | ORAL_CAPSULE | Freq: Four times a day (QID) | ORAL | Status: DC | PRN

## 2020-10-28 MED ORDER — PRENATAL MULTIVITAMIN CH
1.0000 | ORAL_TABLET | Freq: Every day | ORAL | Status: DC
  Administered 2020-10-28 – 2020-10-29 (×2): 1 via ORAL
  Filled 2020-10-28 (×2): qty 1

## 2020-10-28 NOTE — Discharge Summary (Signed)
Postpartum Discharge Summary  Date of Service updated 10/29/20     Patient Name: Shelby Duncan DOB: 1991/01/31 MRN: [id number]  Date of admission: 10/27/2020 Delivery date:10/28/2020  Delivering provider: [name]  Date of discharge: 10/29/2020  Admitting diagnosis: Supervision of normal pregnancy [Z34.90] Intrauterine pregnancy: [redacted]w[redacted]d     Secondary diagnosis:  Active Problems:   Depression   Schizophrenia (HCC)   Supervision of other normal pregnancy, antepartum   Positive GBS test   History of COVID-19   Supervision of normal pregnancy   Abnormal Pap smear of cervix   Vaginal delivery  Additional problems: none    Discharge diagnosis: Term Pregnancy Delivered and Anemia                                              Post partum procedures:none Augmentation: AROM Complications: None  Hospital course: Onset of Labor With Vaginal Delivery      30 y.o. yo [id number] at [redacted]w[redacted]d was admitted in Latent Labor on 10/27/2020. Patient had an uncomplicated labor course as follows:  Membrane Rupture Time/Date: 9:15 PM ,10/27/2020   Delivery Method:Vaginal, Spontaneous  Episiotomy: None  Lacerations:  None  Patient had an uncomplicated postpartum course.  She is ambulating, tolerating a regular diet, passing flatus, and urinating well. Patient is discharged home in stable condition on 10/29/20.  Newborn Data: Birth date:10/28/2020  Birth time:2:54 AM  Gender:Female  Living status:Living  Apgars:8 ,9  Weight:2841 g   Magnesium Sulfate received: No BMZ received: No Rhophylac:N/A MMR:N/A T-DaP:declined Flu: No Transfusion:No  Physical exam  Vitals:   10/28/20 1405 10/28/20 1755 10/28/20 2016 10/29/20 0540  BP: 123/75 133/74 123/80 119/68  Pulse: 82 80 68 81  Resp: 18 16 17 18   Temp: 98.9 F (37.2 C) 97.9 F (36.6 C) 98 F (36.7 C) 98 F (36.7 C)  TempSrc: Oral Oral Oral Oral  SpO2: 100%  100% 100%  Weight:      Height:       General: alert, cooperative and no  distress Lochia: appropriate Uterine Fundus: firm Incision: N/A DVT Evaluation: No evidence of DVT seen on physical exam. Labs: Lab Results  Component Value Date   WBC 17.4 (H) 10/27/2020   HGB 9.2 (L) 10/27/2020   HCT 29.7 (L) 10/27/2020   MCV 83.2 10/27/2020   PLT 306 10/27/2020   CMP Latest Ref Rng & Units 10/27/2020  Glucose 70 - 99 mg/dL 83  BUN 6 - 20 mg/dL 5(L)  Creatinine 1.47 - 1.00 mg/dL 8.29  Sodium 562 - 130 mmol/L 133(L)  Potassium 3.5 - 5.1 mmol/L 3.9  Chloride 98 - 111 mmol/L 104  CO2 22 - 32 mmol/L 20(L)  Calcium 8.9 - 10.3 mg/dL 8.6(V)  Total Protein 6.5 - 8.1 g/dL 6.5  Total Bilirubin 0.3 - 1.2 mg/dL 0.7  Alkaline Phos 38 - 126 U/L 95  AST 15 - 41 U/L 17  ALT 0 - 44 U/L 11   Edinburgh Score: Edinburgh Postnatal Depression Scale Screening Tool 10/28/2020  I have been able to laugh and see the funny side of things. 0  I have looked forward with enjoyment to things. 0  I have blamed myself unnecessarily when things went wrong. 0  I have been anxious or worried for no good reason. 0  I have felt scared or panicky for no good reason. 0  Things have been getting on top of me. 0  I have been so unhappy that I have had difficulty sleeping. 0  I have felt sad or miserable. 0  I have been so unhappy that I have been crying. 0  The thought of harming myself has occurred to me. 0  Edinburgh Postnatal Depression Scale Total 0     After visit meds:  Allergies as of 10/29/2020      Reactions   Chocolate Anaphylaxis, Rash   Food Anaphylaxis, Rash, Other (See Comments)   Pt states that she is allergic to oranges.     Metrogel [metronidazole] Swelling, Other (See Comments)   Reaction:  Swelling of vulva    Shellfish Allergy Swelling, Rash      Medication List    TAKE these medications   acetaminophen 500 MG tablet Commonly known as: TYLENOL Take 2 tablets (1,000 mg total) by mouth every 6 (six) hours as needed (for pain scale < 4).   ascorbic acid 250 MG  tablet Commonly known as: VITAMIN C Take 1 tablet (250 mg total) by mouth every other day. Take with iron. Start taking on: October 30, 2020   Blood Pressure Kit Devi 1 kit by Does not apply route once a week. Check Blood Pressure regularly and record readings into the Babyscripts App.  Large Cuff.  DX O90.0   coconut oil Oil Apply 1 application topically as needed.   ferrous sulfate 325 (65 FE) MG tablet Take 1 tablet (325 mg total) by mouth every other day. Start taking on: October 30, 2020   ibuprofen 600 MG tablet Commonly known as: ADVIL Take 1 tablet (600 mg total) by mouth every 6 (six) hours as needed.   prenatal multivitamin Tabs tablet Take 1 tablet by mouth daily at 12 noon.        Discharge home in stable condition Infant Feeding: Bottle and Breast Infant Disposition:home with mother Discharge instruction: per After Visit Summary and Postpartum booklet. Activity: Advance as tolerated. Pelvic rest for 6 weeks.  Diet: routine diet Future Appointments: Future Appointments  Date Time Provider Department Center  11/03/2020  1:00 PM Gwyndolyn Saxon, LCSW [facility] None  11/04/2020  1:40 PM [facility] NURSE [facility] None  11/26/2020  2:30 PM Hermina Staggers, MD [facility] None   Follow up Visit: Message sent to [facility] 10/28/20 by Germaine Pomfret.   Please schedule this patient for a In person postpartum visit in 4 weeks with the following provider: Any provider. Additional Postpartum F/U:Postpartum Depression checkup and BP check 1 week  High risk pregnancy complicated by: elevated BP without HTN diagnosis Delivery mode:  Vaginal, Spontaneous  Anticipated Birth Control:  declines   10/29/2020 [name], MD

## 2020-10-28 NOTE — Lactation Note (Signed)
This note was copied from a baby's chart. Lactation Consultation Note Baby less than hr old at L&D assistance. Mom's 3rd child. Mom BF her 28 yr old for 3 yrs and her 44 yr old for 1 yr. Assisted this baby into cradle position. Mom has flat compressible nipples. Mom excited baby is doing so good. Lactation will f/u on MBU.  Patient Name: Shelby Duncan VUDTH'Y Date: 10/28/2020 Reason for consult: L&D Initial assessment;Early term 37-38.6wks Age:43 hours  Maternal Data    Feeding Feeding Type: Breast Fed  LATCH Score Latch: Grasps breast easily, tongue down, lips flanged, rhythmical sucking.  Audible Swallowing: None  Type of Nipple: Everted at rest and after stimulation  Comfort (Breast/Nipple): Soft / non-tender  Hold (Positioning): Assistance needed to correctly position infant at breast and maintain latch.  LATCH Score: 7  Interventions Interventions: Assisted with latch;Skin to skin;Adjust position  Lactation Tools Discussed/Used     Consult Status Consult Status: Follow-up Date: 10/28/20 Follow-up type: In-patient    Charyl Dancer 10/28/2020, 3:43 AM

## 2020-10-28 NOTE — Progress Notes (Signed)
Labor Progress Note Shelby Duncan is a 30 y.o. [id number] at [redacted]w[redacted]d presented for SOL. S: Strip reviewed.  O:  BP 137/77   Pulse 93   Temp 98.8 F (37.1 C) (Oral)   Resp 18   Ht 5\' 9"  (1.753 m)   Wt 99.8 kg   LMP 01/21/2020   SpO2 99%   BMI 32.49 kg/m  EFM: baseline 145bpm/mod variability/+ accels/no decels Toco: q1-5 min  CVE: Dilation: 7.5 Effacement (%): 70 Station: 0 Presentation: Vertex Exam by:: [email or web address], RN   A&P: 30 y.o. 26 [redacted]w[redacted]d presented for SOL. #Labor: S/p AROM @2115 . Patient has made good cervical change, continue expectant management. #Pain: PRN #FWB: cat 1 #GBS positive, adequate prophylaxis, PCN #Elevated BP: patient with SBP 130-140s range since admit, no history of elevated BP in pregnancy. PreE labs pending. Asymptomatic.  #Depression: no meds, SW postpartum  [redacted]w[redacted]d, MD 2:23 AM

## 2020-10-28 NOTE — Progress Notes (Deleted)
POSTPARTUM PROGRESS NOTE  Post Partum Day 0  Subjective:  Shelby Duncan is a 30 y.o. [phone] s/p SVD at [redacted]w[redacted]d.  No acute events overnight.  Pt denies problems with ambulating, but has not yet been up to void. She denies nausea or vomiting.  Pain is well controlled.  She has not had flatus. She has not had bowel movement.  Lochia Moderate, no large clots noted.   Objective: Blood pressure 112/83, pulse 79, temperature 98 F (36.7 C), temperature source Oral, resp. rate 18, height 5\' 9"  (1.753 m), weight 99.8 kg, last menstrual period 01/21/2020, SpO2 99 %, unknown if currently breastfeeding.  Physical Exam:  General: alert, cooperative and no distress Chest: no respiratory distress Heart:regular rate, distal pulses intact Abdomen: soft, nontender,  Uterine Fundus: firm, appropriately tender DVT Evaluation: No calf swelling or tenderness Extremities: no edema Skin: warm, dry  Recent Labs    10/27/20 1517 10/27/20 2125  HGB 10.0* 9.2*  HCT 31.0* 29.7*    Assessment/Plan: Shelby Duncan is a 30 y.o. [phone] s/p SVD at [redacted]w[redacted]d   PPD#0 - Doing well Contraception: denies Feeding: breast Dispo: Plan for discharge pending.   LOS: 1 day   [redacted]w[redacted]d, CNM 10/28/2020, 7:50 AM

## 2020-10-28 NOTE — Clinical Social Work Maternal (Signed)
CLINICAL SOCIAL WORK MATERNAL/CHILD NOTE  Patient Details  Name: Shelby Duncan MRN: [id number] Date of Birth: 06-10-1991  Date:  10/28/2020  Clinical Social Worker Initiating Note:  Darra Lis, MSW, [place] Date/Time: Initiated:  10/28/20/1130     Child's Name:  Shelby Duncan   Biological Parents:  Mother   Need for Interpreter:  None   Reason for Referral:  [place] Substance Use/Substance Use During Pregnancy    Address:  [address] [place] [address]    Phone number:  [phone] (home)     Additional phone number:   Household Members/Support Persons (HM/SP):   Household Member/Support Person 1,Household Member/Support Person 2,Household Member/Support Person 3   HM/SP Name Relationship DOB or Age  HM/SP -1 Burt Knack Sister 26  HM/SP -2 Kahlie Deutscher Daughter 06/30/2016  HM/SP -3 Deshaun Mcglothlin Son 05/15/2009  HM/SP -4        HM/SP -5        HM/SP -6        HM/SP -7        HM/SP -8          Natural Supports (not living in the home):  Immediate Family,Extended Family   Professional Supports: None   Employment: Part-time   Type of Work: [place]   Education:  High school graduate   Homebound arranged:    Museum/gallery curator Resources:  Medicaid   Other Resources:  Physicist, medical ,[place] Considerations Which May Impact Care:  Muslim  Strengths:  Ability to meet basic needs ,Pediatrician chosen,Home prepared for child    Psychotropic Medications:         Pediatrician:    [place] area  Pediatrician List:   [facility] ([address])  [place]      Pediatrician Fax Number:    Risk Factors/Current Problems:  Mental Health Concerns ,Substance Use    Cognitive State:  Linear Thinking ,Insightful ,Alert    Mood/Affect:  Calm ,Happy ,Interested     CSW Assessment: CSW consulted for THC use, Depression, schizophrenia and PTSD. CSW met with MOB to assess and offer support. CSW introduced self and role. CSW observed MOB sister exiting the room. MOB was observed holding newborn appropriately. MOB immediately disclosed to CSW "I know why you have to see me. I just gained custody of my son on January 21." CSW expressed to [facility] that custody was going to be a questions and stated the additional reasons for consult. MOB was pleasant and expressed understanding. MOB reported her son Kathyrn Drown was in [place] custody from 76 until last month. MOB reported she gave him a whooping that led to bruises and [facility] CPS removed him from the home. MOB stated she has worked her CPS plan the past 5-6 years to gain custody of [place]. MOB stated the CPS case is officially closed and she gained full custody. CSW inquired about her daughter, who was born during the course of the case. MOB stated her daughter was never removed from her care considering she completed a safety plan following her birth. MOB denies having any CPS history outside the initial case with her son in 33. CSW discussed MOB mental health diagnosis. MOB reported she has a diangosis of depression and PTSD. MOB stated she was misdiagnosed with schizophrenia during her CPS case.  MOB reported the MD who assessed her never saw signs of the diagnosis. MOB stated she has never had audio or visual hallucinations or exhibited any other symptoms of the diagnosis. MOB reported she experienced depression at the start of her CPS case in 2015 and declines any recent symptoms. MOB expressed she has been doing really well, stating her current pregnancy went well. MOB stated she was on medications about 6 years ago and was required to attend therapy through her CPS plan. MOB stated her sister, son's grandmother and a godparent are good supports. MOB denies any current SI, HI or DV.  CSW asked MOB about her substance use.  MOB reported she used THC at the start of her pregnancy to manage her appetite. MOB declines using any additional substances. CSW informed MOB of the hospital drug screen policy and  asked MOB if she has removed cotton balls from baby's diapers. MOB declined knowingly throwing away or removing cotton balls. CSW informed MOB that a CPS report will be made if baby UDS/CDS test positive for substances. MOB expressed understanding.   CSW provided education regarding the baby blues period vs. perinatal mood disorders, discussed treatment and gave resources for mental health follow up if concerns arise.  CSW recommends self-evaluation during the postpartum time period using the New Mom Checklist from Postpartum Progress and encouraged MOB to contact a medical professional if symptoms are noted at any time.   CSW provided review of Sudden Infant Death Syndrome (SIDS) precautions.    Barriers to discharge. CSW will make a CPS report due to cotton balls not being in place as left by RN. CSW will continue to follow UDS/CDS and make an additional CPS report if warranted.   CSW Plan/Description:  Sudden Infant Death Syndrome (SIDS) Education,Perinatal Mood and Anxiety Disorder (PMADs) Education,Hospital Drug Screen Policy Information,Child Protective Service Report ,CSW Will Continue to Monitor Umbilical Cord Tissue Drug Screen Results and Make Report if Warranted,No Further Intervention Required/No Barriers to Discharge    Waylan Boga, [place] 10/28/2020, 11:53 AM

## 2020-10-29 ENCOUNTER — Encounter: Payer: Medicaid Other | Admitting: Obstetrics & Gynecology

## 2020-10-29 LAB — SURGICAL PATHOLOGY

## 2020-10-29 MED ORDER — ACETAMINOPHEN 500 MG PO TABS: 1000.0000 mg | ORAL_TABLET | Freq: Four times a day (QID) | ORAL | Status: AC | PRN

## 2020-10-29 MED ORDER — IBUPROFEN 600 MG PO TABS: 600.0000 mg | ORAL_TABLET | Freq: Four times a day (QID) | ORAL | 0 refills | Status: AC | PRN

## 2020-10-29 MED ORDER — ASCORBIC ACID 250 MG PO TABS: 250.0000 mg | ORAL_TABLET | ORAL | Status: AC

## 2020-10-29 MED ORDER — FERROUS SULFATE 325 (65 FE) MG PO TABS: 325.0000 mg | ORAL_TABLET | ORAL | 3 refills | Status: AC

## 2020-10-29 MED ORDER — COCONUT OIL OIL: 1.0000 "application " | TOPICAL_OIL | 0 refills | Status: AC | PRN

## 2020-10-29 NOTE — Discharge Instructions (Signed)
-take tylenol 1000 mg every 6 hours as needed for pain, alternate with ibuprofen 600 mg every 6 hours -drink plenty of water to help with breastfeeding -continue prenatal vitamins while you are breastfeeding -take iron pills every other day with vitamin c, this will help healing as well as breast feeding -think about birth control options-->bedisider.org is a great website! You can get any form of birth control from the health department for free   Postpartum Care After Vaginal Delivery The following information offers guidance about how to care for yourself from the time you deliver your baby to 6-12 weeks after delivery (postpartum period). If you have problems or questions, contact your health care provider for more specific instructions. Follow these instructions at home: Vaginal bleeding  It is normal to have vaginal bleeding (lochia) after delivery. Wear a sanitary pad for bleeding and discharge. ? During the first week after delivery, the amount and appearance of lochia is often similar to a menstrual period. ? Over the next few weeks, it will gradually decrease to a dry, yellow-brown discharge. ? For most women, lochia stops completely by 4-6 weeks after delivery, but can vary.  Change your sanitary pads frequently. Watch for any changes in your flow, such as: ? A sudden increase in volume. ? A change in color. ? Large blood clots.  If you pass a blood clot from your vagina, save it and call your health care provider. Do not flush blood clots down the toilet before talking with your health care provider.  Do not use tampons or douches until your health care provider approves.  If you are not breastfeeding, your period should return 6-8 weeks after delivery. If you are feeding your baby breast milk only, your period may not return until you stop breastfeeding. Perineal care  Keep the area between the vagina and the anus (perineum) clean and dry. Use medicated pads and  pain-relieving sprays and creams as directed.  If you had a surgical cut in the perineum (episiotomy) or a tear, check the area for signs of infection until you are healed. Check for: ? More redness, swelling, or pain. ? Fluid or blood coming from the cut or tear. ? Warmth. ? Pus or a bad smell.  You may be given a squirt bottle to use instead of wiping to clean the perineum area after you use the bathroom. Pat the area gently to dry it.  To relieve pain caused by an episiotomy, a tear, or swollen veins in the anus (hemorrhoids), take a warm sitz bath 2-3 times a day. In a sitz bath, the warm water should only come up to your hips and cover your buttocks.   Breast care  In the first few days after delivery, your breasts may feel heavy, full, and uncomfortable (breast engorgement). Milk may also leak from your breasts. Ask your health care provider about ways to help relieve the discomfort.  If you are breastfeeding: ? Wear a bra that supports your breasts and fits well. Use breast pads to absorb milk that leaks. ? Keep your nipples clean and dry. Apply creams and ointments as told. ? You may have uterine contractions every time you breastfeed for up to several weeks after delivery. This helps your uterus return to its normal size. ? If you have any problems with breastfeeding, notify your health care provider or lactation consultant.  If you are not breastfeeding: ? Avoid touching your breasts. Do not squeeze out (express) milk. Doing this can make your   breasts produce more milk. ? Wear a good-fitting bra and use cold packs to help with swelling. Intimacy and sexuality  Ask your health care provider when you can engage in sexual activity. This may depend upon: ? Your risk of infection. ? How fast you are healing. ? Your comfort and desire to engage in sexual activity.  You are able to get pregnant after delivery, even if you have not had your period. Talk with your health care provider  about methods of birth control (contraception) or family planning if you desire future pregnancies. Medicines  Take over-the-counter and prescription medicines only as told by your health care provider.  Take an over-the-counter stool softener to help ease bowel movements as told by your health care provider.  If you were prescribed an antibiotic medicine, take it as told by your health care provider. Do not stop taking the antibiotic even if you start to feel better.  Review all previous and current prescriptions to check for possible transfer into breast milk. Activity  Gradually return to your normal activities as told by your health care provider.  Rest as much as possible. Nap while your baby is sleeping. Eating and drinking  Drink enough fluid to keep your urine pale yellow.  To help prevent or relieve constipation, eat high-fiber foods every day.  Choose healthy eating to support breastfeeding or weight loss goals.  Take your prenatal vitamins until your health care provider tells you to stop.   General tips/recommendations  Do not use any products that contain nicotine or tobacco. These products include cigarettes, chewing tobacco, and vaping devices, such as e-cigarettes. If you need help quitting, ask your health care provider.  Do not drink alcohol, especially if you are breastfeeding.  Do not take medications or drugs that are not prescribed to you, especially if you are breastfeeding.  Visit your health care provider for a postpartum checkup within the first 3-6 weeks after delivery.  Complete a comprehensive postpartum visit no later than 12 weeks after delivery.  Keep all follow-up visits for you and your baby. Contact a health care provider if:  You feel unusually sad or worried.  Your breasts become red, painful, or hard.  You have a fever or other signs of an infection.  You have bleeding that is soaking through one pad an hour or you have blood  clots.  You have a severe headache that doesn't go away or you have vision changes.  You have nausea and vomiting and are unable to eat or drink anything for 24 hours. Get help right away if:  You have chest pain or difficulty breathing.  You have sudden, severe leg pain.  You faint or have a seizure.  You have thoughts about hurting yourself or your baby. If you ever feel like you may hurt yourself or others, or have thoughts about taking your own life, get help right away. Go to your nearest emergency department or:  Call your local emergency services (911 in the [place]  The National Suicide Prevention Lifeline at 1-800-273-8255. This suicide crisis helpline is open 24 hours a day.  Text the Crisis Text Line at 741741 (in the [place] Summary  The period of time after you deliver your newborn up to 6-12 weeks after delivery is called the postpartum period.  Keep all follow-up visits for you and your baby.  Review all previous and current prescriptions to check for possible transfer into breast milk.  Contact a health care provider if you feel   unusually sad or worried during the postpartum period. This information is not intended to replace advice given to you by your health care provider. Make sure you discuss any questions you have with your health care provider. Document Revised: 05/29/2020 Document Reviewed: 05/29/2020 Elsevier Patient Education  2021 Elsevier Inc.  

## 2020-10-29 NOTE — Social Work (Signed)
CSW spoke with [facility] CPS. There are no discharge barriers.   No barriers to discharge. CSW will continue to follow CDS and make an additional CPS report if warranted.  Manfred Arch, MSW, [employer] Clinical Social Work [employer] [phone]

## 2020-10-29 NOTE — Lactation Note (Signed)
This note was copied from a baby's chart. Lactation Consultation Note  Patient Name: Shelby Duncan [id number] Date: 10/29/2020   Age:29 hours  Feeding  Mother is breastfeeding and supplementing with formula.  Has manual pump. Mother requested DEBP.  Faxed WIC pump referral.   LATCH Score Latch: Grasps breast easily, tongue down, lips flanged, rhythmical sucking.  Audible Swallowing: A few with stimulation  Type of Nipple: Everted at rest and after stimulation  Comfort (Breast/Nipple): Soft / non-tender  Hold (Positioning): No assistance needed to correctly position infant at breast.  LATCH Score: 9   Lactation Tools Discussed/Used    Interventions Interventions: DEBP;Hand pump     Consult Status No consult      Hardie Pulley 10/29/2020, 9:47 AM

## 2020-10-30 ENCOUNTER — Encounter: Payer: Self-pay | Admitting: Family Medicine

## 2020-10-30 DIAGNOSIS — O411290 Chorioamnionitis, unspecified trimester, not applicable or unspecified: Secondary | ICD-10-CM | POA: Insufficient documentation

## 2020-11-03 ENCOUNTER — Telehealth: Payer: Self-pay | Admitting: Licensed Clinical Social Worker

## 2020-11-03 ENCOUNTER — Encounter: Payer: Medicaid Other | Admitting: Licensed Clinical Social Worker

## 2020-11-03 NOTE — Telephone Encounter (Signed)
Called pt twice regarding scheduled mychart visit. Left message for callback 

## 2020-11-04 ENCOUNTER — Ambulatory Visit: Payer: Medicaid Other

## 2020-11-06 ENCOUNTER — Ambulatory Visit: Payer: Medicaid Other

## 2020-11-10 ENCOUNTER — Ambulatory Visit (INDEPENDENT_AMBULATORY_CARE_PROVIDER_SITE_OTHER): Payer: Medicaid Other | Admitting: Licensed Clinical Social Worker

## 2020-11-10 DIAGNOSIS — Z8659 Personal history of other mental and behavioral disorders: Secondary | ICD-10-CM

## 2020-11-12 NOTE — BH Specialist Note (Signed)
Integrated Behavioral Health via Telemedicine Visit  11/12/2020 Shelby Duncan [id number]  Number of Integrated Behavioral Health visits: 1/6 Session Start time: 2:13pm  Session End time: 2:35pm Total time: 22 min via mychart   Referring Provider: Hospital Discharge  Patient/Family location: Home  [facility] Provider location: [facility]  All persons participating in visit: Pt Shelby Duncan and LCSWA A. Felton Clinton  Types of Service: General Behavioral Integrated Care (BHI)  I connected with Shelby Duncan and/or Shelby Duncan's n/a by Telephone  (Video is Caregility application) and verified that I am speaking with the correct person using two identifiers.Discussed confidentiality: Yes   I discussed the limitations of telemedicine and the availability of in person appointments.  Discussed there is a possibility of technology failure and discussed alternative modes of communication if that failure occurs.  I discussed that engaging in this telemedicine visit, they consent to the provision of behavioral healthcare and the services will be billed under their insurance.  Patient and/or legal guardian expressed understanding and consented to Telemedicine visit: Yes   Presenting Concerns: Patient and/or family reports the following symptoms/concerns: mood check  Duration of problem: 2 weeks ; Severity of problem: mild  Patient and/or Family's Strengths/Protective Factors: Caregiver has knowledge of parenting & child development  Goals Addressed: Patient will: 1.  Reduce symptoms of: post partum mood check   2.  Increase knowledge and/or ability of: coping skills  3.  Demonstrate ability to: Increase healthy adjustment to current life circumstances  Progress towards Goals: Ongoing  Interventions: Interventions utilized:  Supportive Counseling Standardized Assessments completed: Edinburgh Postnatal Depression   Assessment: Patient has previous history of anxiety   Patient may benefit  from integrated behavioral health   Plan: 1. Follow up with behavioral health clinician on : as needed  2. Behavioral recommendations: Create schedule routine to streamline daily task, prioritize sleep, engage in self care and relaxation techniques to prevent burn out 3. Referral(s): n/a  I discussed the assessment and treatment plan with the patient and/or parent/guardian. They were provided an opportunity to ask questions and all were answered. They agreed with the plan and demonstrated an understanding of the instructions.   They were advised to call back or seek an in-person evaluation if the symptoms worsen or if the condition fails to improve as anticipated.  Shelby Saxon, LCSW

## 2020-11-26 ENCOUNTER — Ambulatory Visit: Payer: Medicaid Other | Admitting: Obstetrics and Gynecology

## 2020-12-03 ENCOUNTER — Ambulatory Visit (INDEPENDENT_AMBULATORY_CARE_PROVIDER_SITE_OTHER): Payer: Medicaid Other | Admitting: Obstetrics

## 2020-12-03 ENCOUNTER — Encounter: Payer: Self-pay | Admitting: Obstetrics

## 2020-12-03 ENCOUNTER — Other Ambulatory Visit: Payer: Self-pay

## 2020-12-03 DIAGNOSIS — Z3009 Encounter for other general counseling and advice on contraception: Secondary | ICD-10-CM

## 2020-12-03 DIAGNOSIS — R87610 Atypical squamous cells of undetermined significance on cytologic smear of cervix (ASC-US): Secondary | ICD-10-CM

## 2020-12-03 NOTE — Progress Notes (Signed)
    Post Partum Visit Note  Shelby Duncan is a 30 y.o. [phone] female who presents for a postpartum visit. She is 5 weeks postpartum following a normal spontaneous vaginal delivery.  I have fully reviewed the prenatal and intrapartum course. The delivery was at 38 gestational weeks.  Anesthesia: None Postpartum course has been unremarkable. Baby is doing well. Baby is feeding by breast and bottle - Good Start [employer]arer. Bleeding no bleeding. Bowel function is normal. Bladder function is normal. Patient is not sexually active. Contraception method is none. Postpartum depression screening: negative.   The pregnancy intention screening data noted above was reviewed. Potential methods of contraception were discussed. The patient declines contraception.     The following portions of the patient's history were reviewed and updated as appropriate: allergies, current medications, past family history, past medical history, past social history, past surgical history and problem list.  Review of Systems A comprehensive review of systems was negative.    Objective:  LMP 01/21/2020    General:  alert and no distress   Breasts:  inspection negative, no nipple discharge or bleeding, no masses or nodularity palpable  Lungs: clear to auscultation bilaterally  Heart:  regular rate and rhythm, S1, S2 normal, no murmur, click, rub or gallop  Abdomen: soft, non-tender; bowel sounds normal; no masses,  no organomegaly   Vulva:  normal  Vagina: normal vagina  Cervix:  no cervical motion tenderness and no lesions  Corpus: normal size, contour, position, consistency, mobility, non-tender  Adnexa:  no mass, fullness, tenderness  Rectal Exam: Not performed.        Assessment:    1. Postpartum care following vaginal delivery - doing well  2. Encounter for counseling regarding contraception - declines contraception - condoms recommended for STD prevention  3. ASCUS of cervix with negative high risk  HPV - repeat pap yearly   Plan:   Essential components of care per ACOG recommendations:  1.  Mood and well being: Patient with negative depression screening today.  - Patient does not use tobacco.  - hx of drug use? No    2. Infant care and feeding:  -Patient currently breastmilk feeding? Yes, breast and bottle feeding  If breastmilk feeding discussed return to work and pumping. If needed, patient was provided letter for work to allow for every 2-3 hr pumping breaks, and to be granted a private location to express breastmilk and refrigerated area to store breastmilk. Reviewed importance of draining breast regularly to support lactation. -Social determinants of health (SDOH) reviewed in EPIC. No concerns  3. Sexuality, contraception and birth spacing - Patient does not want a pregnancy in the next year.  Desired family size is 3 children.  - Reviewed forms of contraception in tiered fashion. Patient desired no method today.   - Discussed birth spacing of 18 months  4. Sleep and fatigue -Encouraged family/partner/community support of 4 hrs of uninterrupted sleep to help with mood and fatigue  5. Physical Recovery  - Discussed patients delivery.  There were no complications. - Patient has urinary incontinence? No - Patient is safe to resume physical and sexual activity  6.  Health Maintenance - Last pap smear done 04/08/2020 and was abnormal with ASC-US  with negative HPV.   7. No Chronic Disease   [facility], [facility] Brock Bad, MD 12/03/2020 3:26 PM

## 2021-03-24 NOTE — Progress Notes (Signed)
Patient did not show for appointment.   

## 2021-03-25 ENCOUNTER — Encounter: Payer: Medicaid Other | Admitting: Family

## 2021-12-02 ENCOUNTER — Encounter: Payer: Medicaid Other | Admitting: Obstetrics

## 2022-06-19 ENCOUNTER — Inpatient Hospital Stay (HOSPITAL_COMMUNITY): Admit: 2022-06-19 | Payer: Self-pay

## 2022-06-23 IMAGING — US US MFM OB DETAIL+14 WK
1 series · 13 of 28 positions shown · non-contrast
Comparison: none

[Series 1: us mfm ob detail+14 wk · 13 of 83 slices shown]
[im 4/83]
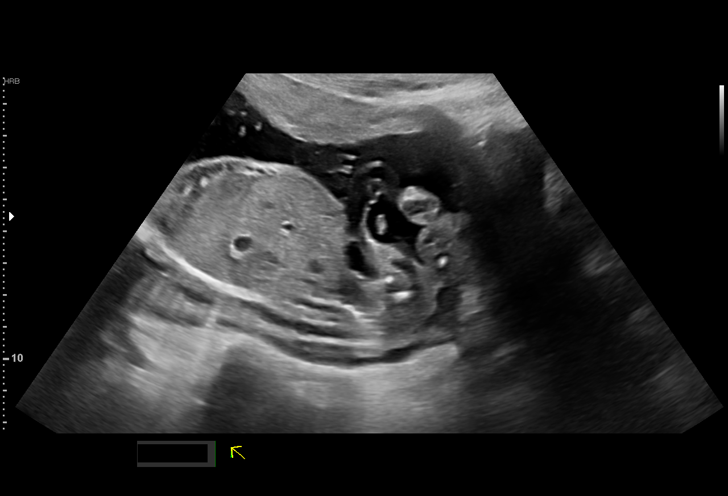
[im 10/83]
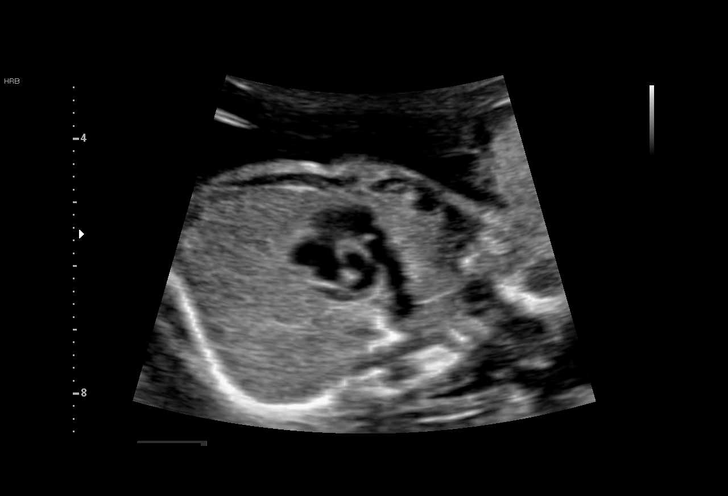
[im 16/83]
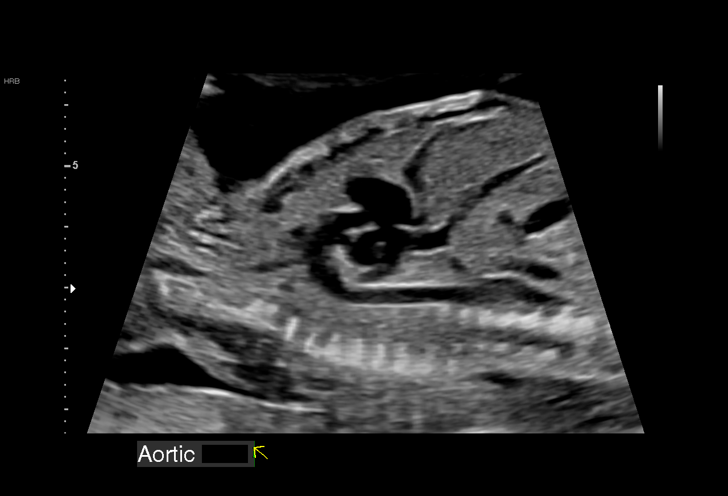
[im 22/83]
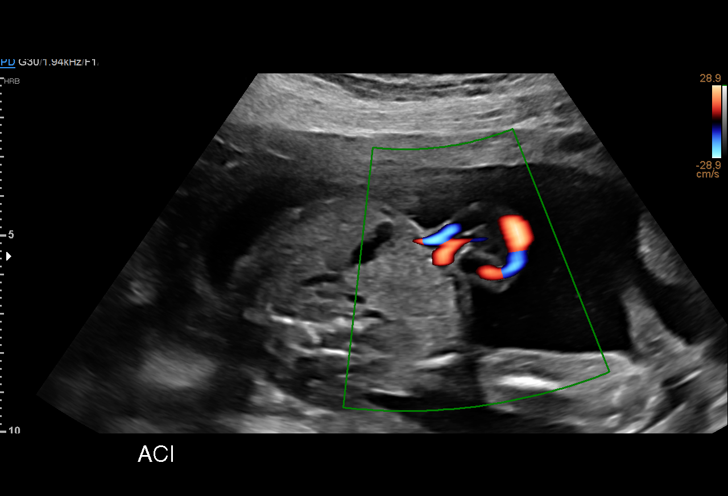
[im 28/83]
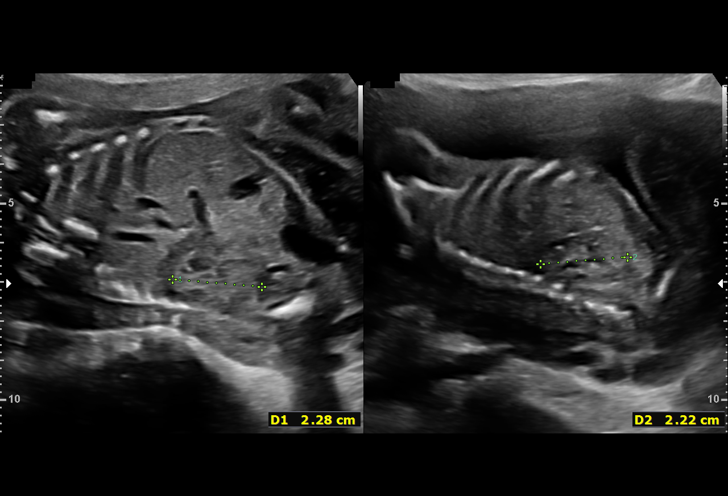
[im 34/83]
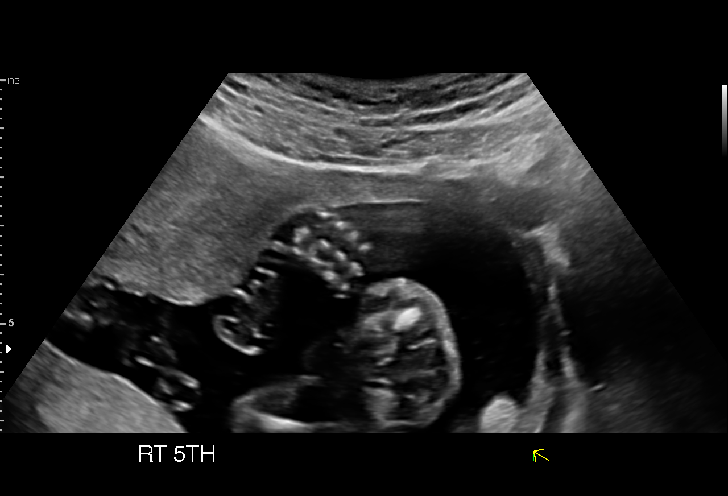
[im 43/83]
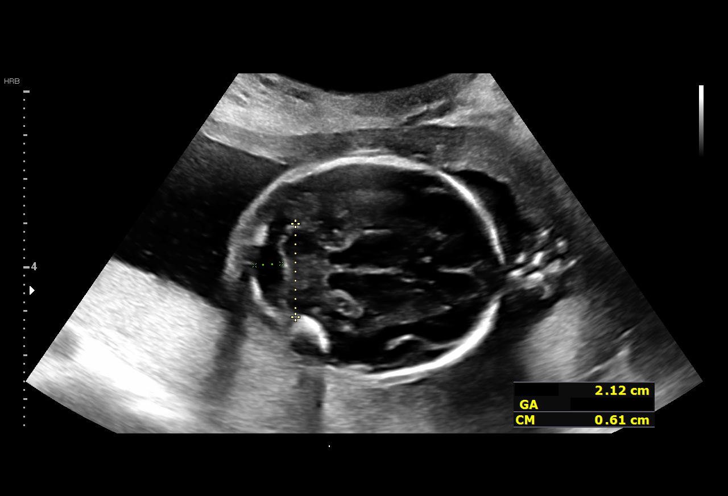
[im 49/83]
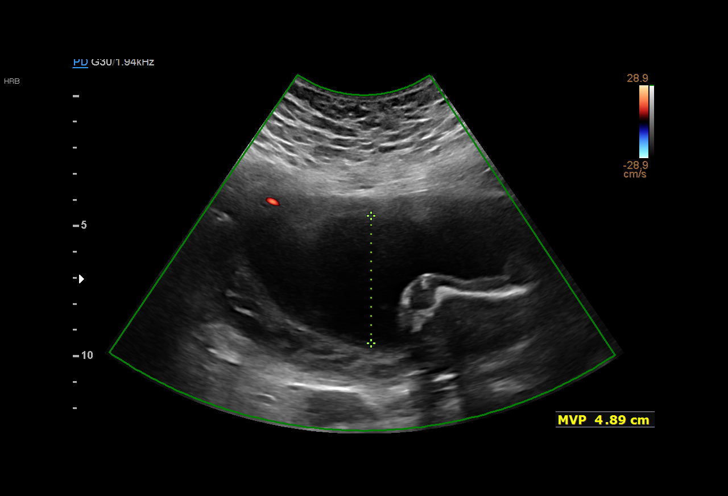
[im 55/83]
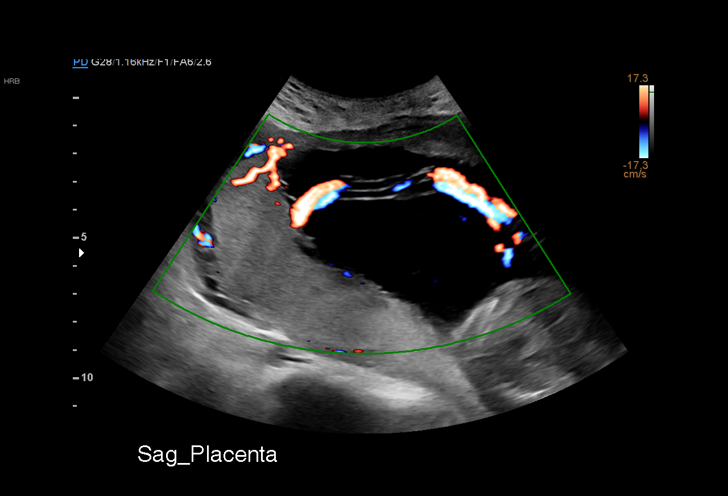
[im 61/83]
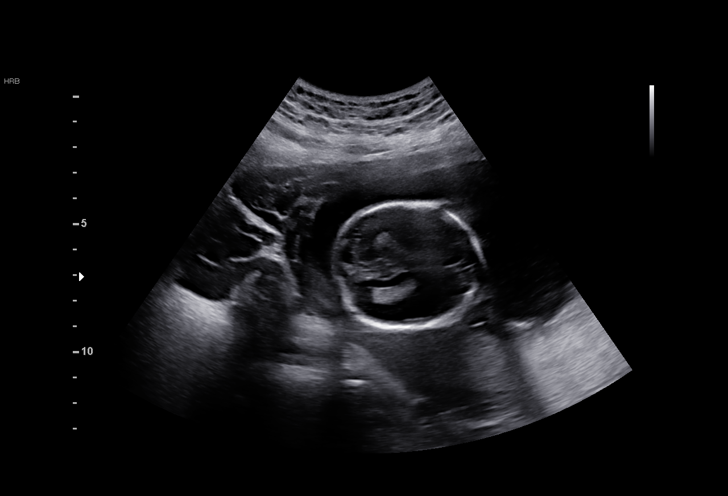
[im 67/83]
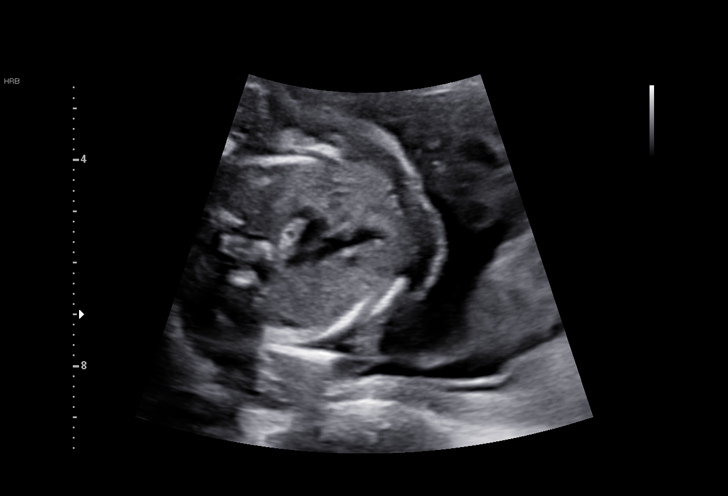
[im 73/83]
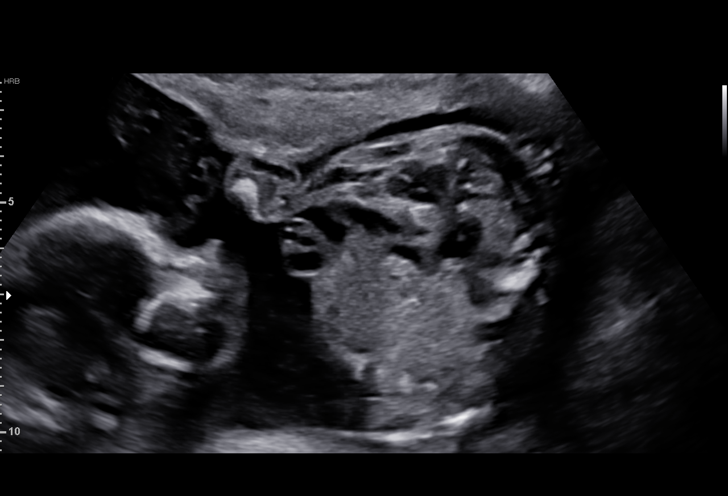
[im 79/83]
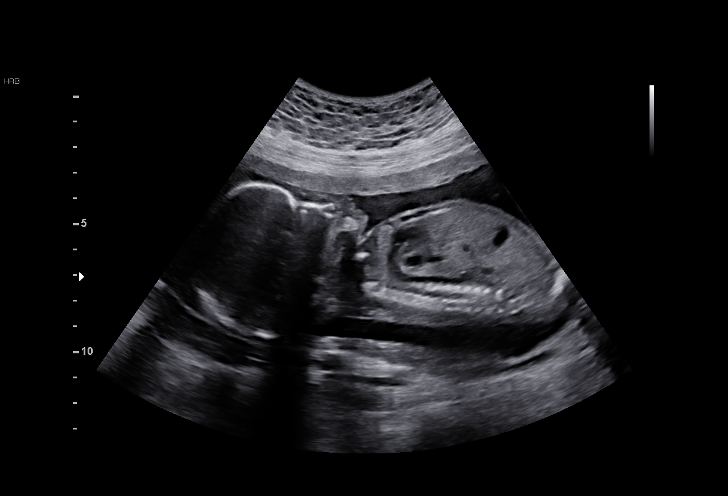

[13 of 28 positions shown; findings below may reference images not displayed]

[REDACTED]care

Indications

 Obesity complicating pregnancy, second
 trimester (BMI 30)
 Hypertension - Chronic/Pre-existing - no
 meds
 Encounter for antenatal screening for
 malformations (low risk NIPS, neg Horizon)
 21 weeks gestation of pregnancy
Vital Signs

 BMI:
Fetal Evaluation

 Num Of Fetuses:         1
 Fetal Heart Rate(bpm):  153
 Cardiac Activity:       Observed
 Presentation:           Breech
 Placenta:               Posterior
 P. Cord Insertion:      Visualized, central

 Amniotic Fluid
 AFI FV:      Within normal limits

                             Largest Pocket(cm)

Biometry

 BPD:      48.5  mm     G. Age:  20w 5d         29  %    CI:        75.13   %    70 - 86
                                                         FL/HC:      19.9   %    15.9 -
 HC:      177.5  mm     G. Age:  20w 2d          9  %    HC/AC:      1.03        1.06 -
 AC:       173   mm     G. Age:  22w 2d         78  %    FL/BPD:     73.0   %
 FL:       35.4  mm     G. Age:  21w 1d         42  %    FL/AC:      20.5   %    20 - 24
 CER:      21.2  mm     G. Age:  20w 1d         29  %
 LV:        4.9  mm
 CM:        6.1  mm
 Est. FW:     434  gm    0 lb 15 oz      68  %
OB History

 Gravidity:    4         Term:   2         SAB:   1
 Living:       2
Gestational Age

 U/S Today:     21w 1d                                        EDD:   11/10/20
 Best:          21w 1d     Det. By:  U/S (07/01/20)           EDD:   11/10/20
Anatomy

 Cranium:               Appears normal         Aortic Arch:            Appears normal
 Cavum:                 Appears normal         Ductal Arch:            Appears normal
 Ventricles:            Appears normal         Diaphragm:              Appears normal
 Choroid Plexus:        Appears normal         Stomach:                Appears normal, left
                                                                       sided
 Cerebellum:            Appears normal         Abdomen:                Appears normal
 Posterior Fossa:       Appears normal         Abdominal Wall:         Appears nml (cord
                                                                       insert, abd wall)
 Nuchal Fold:           Not applicable (>20    Cord Vessels:           Appears normal (3
                        wks GA)                                        vessel cord)
 Face:                  Appears normal         Kidneys:                Appear normal
                        (orbits and profile)
 Lips:                  Appears normal         Bladder:                Appears normal
 Thoracic:              Appears normal         Spine:                  Not well visualized
 Heart:                 Appears normal         Upper Extremities:      Appears normal
                        (4CH, axis, and
                        situs)
 RVOT:                  Appears normal         Lower Extremities:      Appears normal
 LVOT:                  Appears normal

 Other:  Fetus appears to be a male. Heels and 5th digit visualized. Nasal
         bone visualized. Open hands visualized. 3VV and 3VTV visualized.
Cervix Uterus Adnexa

 Cervix
 Length:           3.73  cm.
 Normal appearance by transabdominal scan.

 Uterus
 No abnormality visualized.
 Right Ovary
 Not visualized.

 Left Ovary
 Not visualized.

 Cul De Sac
 No free fluid seen.

 Adnexa
 No abnormality visualized.
Comments

 This patient was seen for a detailed fetal anatomy scan due
 to maternal obesity and history of chronic hypertension that is
 not currently treated with any medications.
 She denies any other significant past medical history and
 denies any problems in her current pregnancy.
 She had a cell free DNA test earlier in her pregnancy which
 indicated a low risk for trisomy 21, 18, and 13. A male fetus is
 predicted.
 Based on the fetal biometry measurements obtained today,
 her EDC was changed to November 10, 2020.
 There were no obvious fetal anomalies noted on today's
 ultrasound exam.  However, the views of the fetal spine were
 limited due to the fetal position.
 The patient was informed that anomalies may be missed due
 to technical limitations. If the fetus is in a suboptimal position
 or maternal habitus is increased, visualization of the fetus in
 the maternal uterus may be impaired.
 A follow-up exam was scheduled in 4 weeks to assess the
 fetal growth and to confirm her dates.

## 2022-08-14 IMAGING — DX DG CHEST 1V PORT
1 series · 1 of 1 positions shown · non-contrast
Comparison: Chest radiograph dated 08/03/2019.

CLINICAL DATA: 29-year-old female with positive 5171L-H1 and cough

EXAM:
PORTABLE CHEST 1 VIEW

[chest]
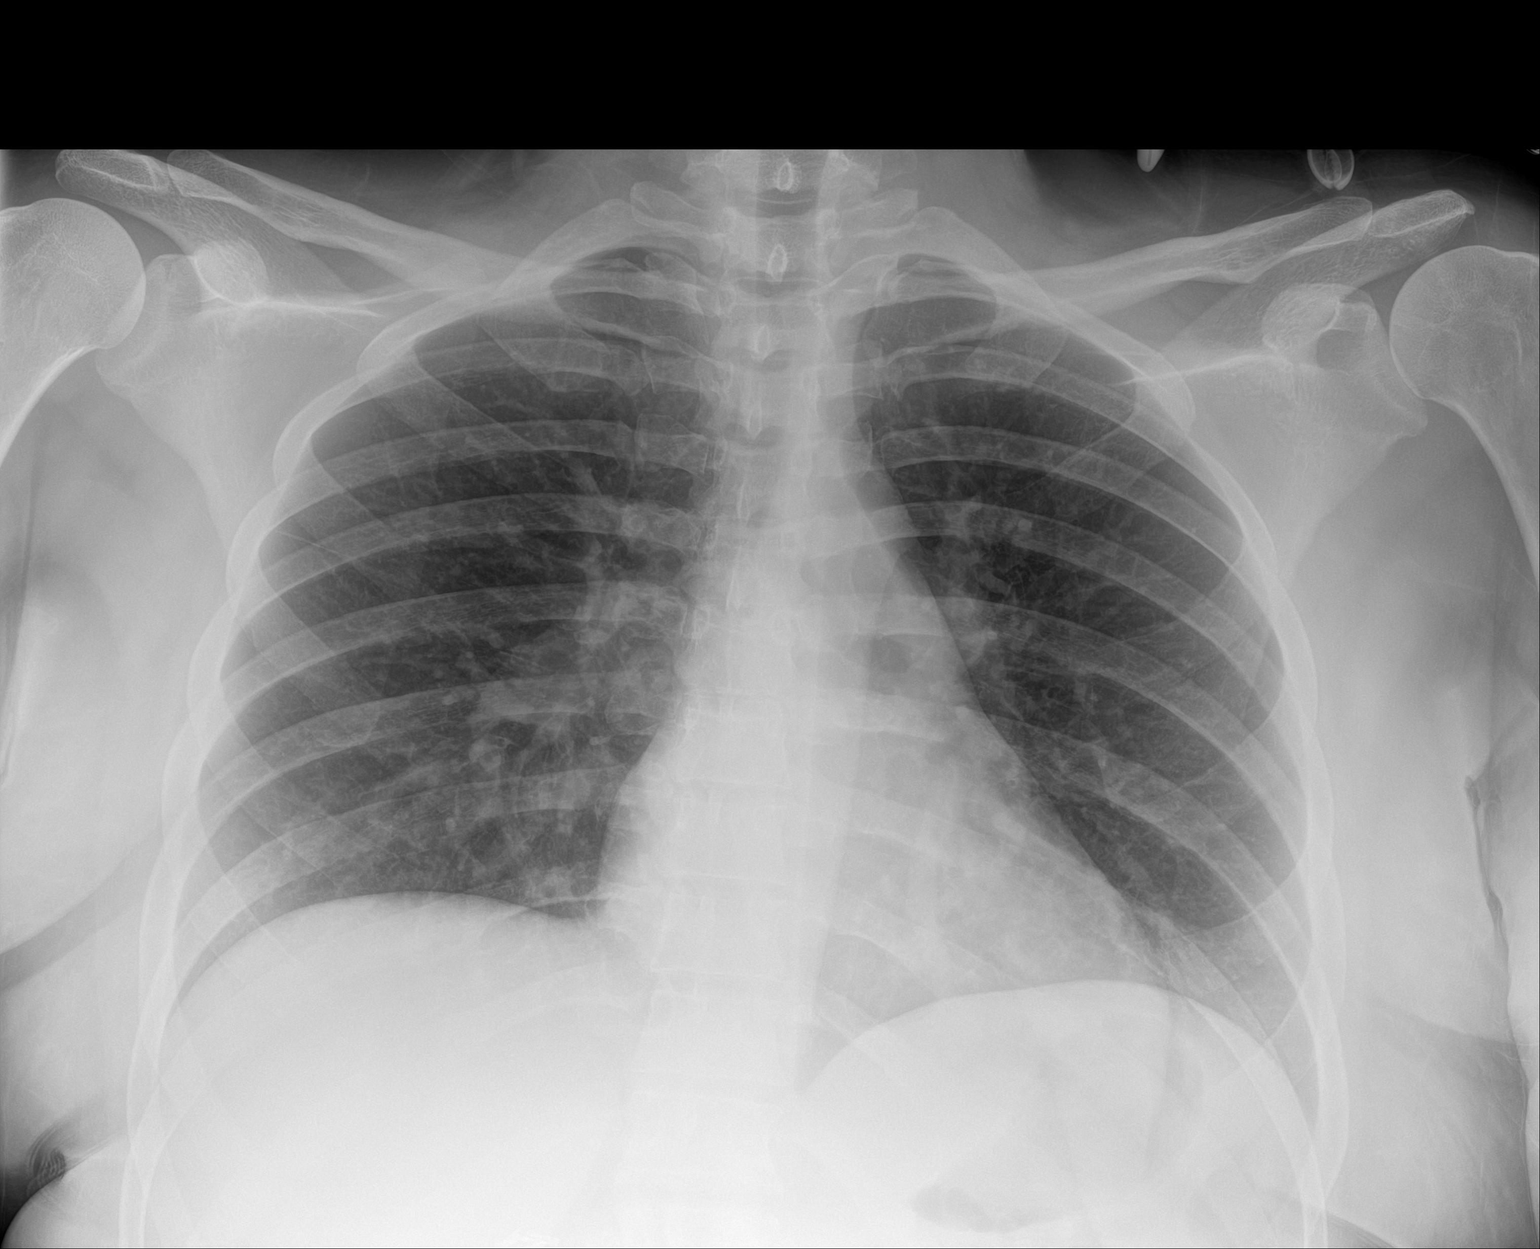

[1 of 1 positions shown; findings below may reference images not displayed]

FINDINGS: The heart size and mediastinal contours are within normal limits.
Both lungs are clear. The visualized skeletal structures are
unremarkable.
IMPRESSION: No active disease.
